# Patient Record
Sex: Female | Born: 1960 | Race: Black or African American | Hispanic: No | Smoking: Former smoker
Health system: Southern US, Community
[De-identification: ages and names within clinical notes are randomized; demographics above are authoritative.]

## PROBLEM LIST (undated history)

## (undated) DIAGNOSIS — I82409 Acute embolism and thrombosis of unspecified deep veins of unspecified lower extremity: Secondary | ICD-10-CM

## (undated) DIAGNOSIS — G709 Myoneural disorder, unspecified: Secondary | ICD-10-CM

## (undated) DIAGNOSIS — M4802 Spinal stenosis, cervical region: Secondary | ICD-10-CM

## (undated) DIAGNOSIS — T148XXA Other injury of unspecified body region, initial encounter: Principal | ICD-10-CM

## (undated) DIAGNOSIS — K219 Gastro-esophageal reflux disease without esophagitis: Secondary | ICD-10-CM

## (undated) DIAGNOSIS — M25569 Pain in unspecified knee: Secondary | ICD-10-CM

## (undated) DIAGNOSIS — B1920 Unspecified viral hepatitis C without hepatic coma: Secondary | ICD-10-CM

## (undated) DIAGNOSIS — Z86718 Personal history of other venous thrombosis and embolism: Secondary | ICD-10-CM

## (undated) DIAGNOSIS — I10 Essential (primary) hypertension: Secondary | ICD-10-CM

## (undated) DIAGNOSIS — G4733 Obstructive sleep apnea (adult) (pediatric): Secondary | ICD-10-CM

## (undated) DIAGNOSIS — N6452 Nipple discharge: Secondary | ICD-10-CM

## (undated) HISTORY — DX: Unspecified viral hepatitis C without hepatic coma: B19.20

## (undated) HISTORY — PX: DILATION AND CURETTAGE OF UTERUS: SHX78

## (undated) HISTORY — PX: OTHER SURGICAL HISTORY: SHX169

## (undated) HISTORY — DX: Acute embolism and thrombosis of unspecified deep veins of unspecified lower extremity: I82.409

## (undated) HISTORY — PX: FRACTURE SURGERY: SHX138

## (undated) HISTORY — DX: Other injury of unspecified body region, initial encounter: T14.8XXA

## (undated) HISTORY — PX: CHOLECYSTECTOMY: SHX55

## (undated) HISTORY — DX: Nipple discharge: N64.52

## (undated) HISTORY — PX: TONSILLECTOMY: SUR1361

## (undated) HISTORY — PX: ABDOMINAL HYSTERECTOMY: SHX81

## (undated) HISTORY — DX: Gastro-esophageal reflux disease without esophagitis: K21.9

## (undated) HISTORY — DX: Personal history of other venous thrombosis and embolism: Z86.718

## (undated) HISTORY — DX: Obstructive sleep apnea (adult) (pediatric): G47.33

## (undated) HISTORY — DX: Pain in unspecified knee: M25.569

## (undated) HISTORY — PX: PILONIDAL CYST EXCISION: SHX744

---

## 2003-08-31 ENCOUNTER — Other Ambulatory Visit: Admission: RE | Admit: 2003-08-31 | Discharge: 2003-08-31 | Payer: Self-pay | Admitting: Family Medicine

## 2003-10-10 ENCOUNTER — Encounter: Admission: RE | Admit: 2003-10-10 | Discharge: 2003-10-10 | Payer: Self-pay | Admitting: Family Medicine

## 2003-11-16 ENCOUNTER — Encounter: Admission: RE | Admit: 2003-11-16 | Discharge: 2003-11-16 | Payer: Self-pay | Admitting: Family Medicine

## 2004-01-15 ENCOUNTER — Ambulatory Visit: Payer: Self-pay | Admitting: Family Medicine

## 2004-02-05 ENCOUNTER — Ambulatory Visit: Payer: Self-pay | Admitting: Gastroenterology

## 2004-02-14 ENCOUNTER — Ambulatory Visit: Payer: Self-pay | Admitting: Family Medicine

## 2004-02-20 ENCOUNTER — Ambulatory Visit (HOSPITAL_COMMUNITY): Admission: RE | Admit: 2004-02-20 | Discharge: 2004-02-20 | Payer: Self-pay | Admitting: Gastroenterology

## 2004-02-20 ENCOUNTER — Encounter (INDEPENDENT_AMBULATORY_CARE_PROVIDER_SITE_OTHER): Payer: Self-pay | Admitting: *Deleted

## 2004-03-17 ENCOUNTER — Ambulatory Visit: Payer: Self-pay | Admitting: Gastroenterology

## 2004-04-07 ENCOUNTER — Ambulatory Visit: Payer: Self-pay | Admitting: Gastroenterology

## 2004-04-28 ENCOUNTER — Ambulatory Visit: Payer: Self-pay | Admitting: Gastroenterology

## 2004-04-28 ENCOUNTER — Ambulatory Visit: Payer: Self-pay | Admitting: Family Medicine

## 2004-05-15 ENCOUNTER — Ambulatory Visit: Payer: Self-pay | Admitting: Internal Medicine

## 2004-05-28 ENCOUNTER — Ambulatory Visit: Payer: Self-pay | Admitting: Family Medicine

## 2004-05-28 ENCOUNTER — Ambulatory Visit: Payer: Self-pay

## 2004-05-29 ENCOUNTER — Ambulatory Visit: Payer: Self-pay | Admitting: Internal Medicine

## 2004-05-30 ENCOUNTER — Ambulatory Visit: Payer: Self-pay | Admitting: Internal Medicine

## 2004-06-02 ENCOUNTER — Ambulatory Visit: Payer: Self-pay | Admitting: Family Medicine

## 2004-06-04 ENCOUNTER — Encounter: Admission: RE | Admit: 2004-06-04 | Discharge: 2004-06-04 | Payer: Self-pay | Admitting: Family Medicine

## 2004-06-05 ENCOUNTER — Ambulatory Visit: Payer: Self-pay | Admitting: Oncology

## 2004-06-06 ENCOUNTER — Ambulatory Visit: Payer: Self-pay | Admitting: Family Medicine

## 2004-06-09 ENCOUNTER — Ambulatory Visit: Payer: Self-pay | Admitting: Family Medicine

## 2004-06-16 ENCOUNTER — Ambulatory Visit: Payer: Self-pay | Admitting: Family Medicine

## 2004-06-19 ENCOUNTER — Ambulatory Visit: Payer: Self-pay | Admitting: Internal Medicine

## 2004-06-20 ENCOUNTER — Ambulatory Visit: Payer: Self-pay | Admitting: Family Medicine

## 2004-06-26 ENCOUNTER — Other Ambulatory Visit: Admission: RE | Admit: 2004-06-26 | Discharge: 2004-06-26 | Payer: Self-pay | Admitting: Obstetrics and Gynecology

## 2004-07-03 ENCOUNTER — Ambulatory Visit: Payer: Self-pay | Admitting: Family Medicine

## 2004-07-17 ENCOUNTER — Ambulatory Visit: Payer: Self-pay | Admitting: Internal Medicine

## 2004-08-11 ENCOUNTER — Ambulatory Visit: Payer: Self-pay | Admitting: Family Medicine

## 2004-08-18 ENCOUNTER — Ambulatory Visit: Payer: Self-pay | Admitting: Oncology

## 2004-08-21 ENCOUNTER — Ambulatory Visit: Payer: Self-pay | Admitting: Gastroenterology

## 2004-08-25 ENCOUNTER — Ambulatory Visit (HOSPITAL_COMMUNITY): Admission: RE | Admit: 2004-08-25 | Discharge: 2004-08-25 | Payer: Self-pay | Admitting: Obstetrics and Gynecology

## 2004-09-12 ENCOUNTER — Ambulatory Visit: Payer: Self-pay | Admitting: Family Medicine

## 2004-09-12 ENCOUNTER — Ambulatory Visit: Payer: Self-pay

## 2004-09-18 ENCOUNTER — Ambulatory Visit: Payer: Self-pay | Admitting: Gastroenterology

## 2004-10-16 ENCOUNTER — Ambulatory Visit: Payer: Self-pay | Admitting: Internal Medicine

## 2004-11-13 ENCOUNTER — Ambulatory Visit: Payer: Self-pay | Admitting: Gastroenterology

## 2005-01-06 ENCOUNTER — Ambulatory Visit: Payer: Self-pay | Admitting: Oncology

## 2005-01-08 ENCOUNTER — Ambulatory Visit: Payer: Self-pay | Admitting: Gastroenterology

## 2005-02-18 ENCOUNTER — Inpatient Hospital Stay (HOSPITAL_COMMUNITY): Admission: RE | Admit: 2005-02-18 | Discharge: 2005-02-19 | Payer: Self-pay | Admitting: Obstetrics and Gynecology

## 2005-02-18 ENCOUNTER — Encounter (INDEPENDENT_AMBULATORY_CARE_PROVIDER_SITE_OTHER): Payer: Self-pay | Admitting: *Deleted

## 2005-02-23 ENCOUNTER — Ambulatory Visit: Payer: Self-pay | Admitting: Oncology

## 2005-04-08 ENCOUNTER — Ambulatory Visit: Payer: Self-pay | Admitting: Family Medicine

## 2005-04-13 ENCOUNTER — Ambulatory Visit: Payer: Self-pay | Admitting: Family Medicine

## 2005-04-23 ENCOUNTER — Encounter: Admission: RE | Admit: 2005-04-23 | Discharge: 2005-04-23 | Payer: Self-pay | Admitting: Family Medicine

## 2005-05-06 ENCOUNTER — Ambulatory Visit (HOSPITAL_BASED_OUTPATIENT_CLINIC_OR_DEPARTMENT_OTHER): Admission: RE | Admit: 2005-05-06 | Discharge: 2005-05-06 | Payer: Self-pay | Admitting: Family Medicine

## 2005-05-28 ENCOUNTER — Ambulatory Visit: Payer: Self-pay | Admitting: Pulmonary Disease

## 2005-06-25 ENCOUNTER — Ambulatory Visit: Payer: Self-pay | Admitting: Pulmonary Disease

## 2005-10-07 ENCOUNTER — Ambulatory Visit: Payer: Self-pay | Admitting: Family Medicine

## 2005-10-22 ENCOUNTER — Ambulatory Visit: Payer: Self-pay | Admitting: Gastroenterology

## 2006-03-25 ENCOUNTER — Ambulatory Visit: Payer: Self-pay | Admitting: Family Medicine

## 2006-03-25 LAB — CONVERTED CEMR LAB
BUN: 10 mg/dL (ref 6–23)
Bilirubin, Direct: 0.2 mg/dL (ref 0.0–0.3)
CO2: 28 meq/L (ref 19–32)
Chloride: 106 meq/L (ref 96–112)
Glucose, Bld: 126 mg/dL — ABNORMAL HIGH (ref 70–99)
Sodium: 139 meq/L (ref 135–145)
Total Protein: 7.2 g/dL (ref 6.0–8.3)

## 2006-05-04 ENCOUNTER — Ambulatory Visit: Payer: Self-pay | Admitting: Gastroenterology

## 2006-07-27 ENCOUNTER — Telehealth (INDEPENDENT_AMBULATORY_CARE_PROVIDER_SITE_OTHER): Payer: Self-pay | Admitting: *Deleted

## 2006-07-28 ENCOUNTER — Ambulatory Visit: Payer: Self-pay | Admitting: Family Medicine

## 2006-07-28 LAB — CONVERTED CEMR LAB
Bilirubin Urine: NEGATIVE
Glucose, Urine, Semiquant: NEGATIVE
Protein, U semiquant: NEGATIVE
Urobilinogen, UA: NEGATIVE

## 2006-09-15 ENCOUNTER — Ambulatory Visit: Payer: Self-pay | Admitting: Family Medicine

## 2006-09-15 DIAGNOSIS — M25569 Pain in unspecified knee: Secondary | ICD-10-CM | POA: Insufficient documentation

## 2006-09-15 LAB — CONVERTED CEMR LAB
Blood in Urine, dipstick: NEGATIVE
Nitrite: NEGATIVE
WBC Urine, dipstick: NEGATIVE
pH: 6.5

## 2006-09-16 ENCOUNTER — Encounter (INDEPENDENT_AMBULATORY_CARE_PROVIDER_SITE_OTHER): Payer: Self-pay | Admitting: *Deleted

## 2006-09-20 ENCOUNTER — Encounter (INDEPENDENT_AMBULATORY_CARE_PROVIDER_SITE_OTHER): Payer: Self-pay | Admitting: *Deleted

## 2006-10-08 ENCOUNTER — Ambulatory Visit: Payer: Self-pay | Admitting: Family Medicine

## 2006-10-08 ENCOUNTER — Other Ambulatory Visit: Admission: RE | Admit: 2006-10-08 | Discharge: 2006-10-08 | Payer: Self-pay | Admitting: Family Medicine

## 2006-10-08 ENCOUNTER — Encounter: Payer: Self-pay | Admitting: Family Medicine

## 2006-10-08 DIAGNOSIS — N6019 Diffuse cystic mastopathy of unspecified breast: Secondary | ICD-10-CM | POA: Insufficient documentation

## 2006-10-08 DIAGNOSIS — G47419 Narcolepsy without cataplexy: Secondary | ICD-10-CM | POA: Insufficient documentation

## 2006-10-08 DIAGNOSIS — J4530 Mild persistent asthma, uncomplicated: Secondary | ICD-10-CM | POA: Insufficient documentation

## 2006-10-08 DIAGNOSIS — G4733 Obstructive sleep apnea (adult) (pediatric): Secondary | ICD-10-CM | POA: Insufficient documentation

## 2006-10-08 DIAGNOSIS — B1710 Acute hepatitis C without hepatic coma: Secondary | ICD-10-CM | POA: Insufficient documentation

## 2006-10-08 DIAGNOSIS — Z86718 Personal history of other venous thrombosis and embolism: Secondary | ICD-10-CM | POA: Insufficient documentation

## 2006-10-08 DIAGNOSIS — K219 Gastro-esophageal reflux disease without esophagitis: Secondary | ICD-10-CM | POA: Insufficient documentation

## 2006-10-08 DIAGNOSIS — N318 Other neuromuscular dysfunction of bladder: Secondary | ICD-10-CM | POA: Insufficient documentation

## 2006-10-14 ENCOUNTER — Encounter (INDEPENDENT_AMBULATORY_CARE_PROVIDER_SITE_OTHER): Payer: Self-pay | Admitting: *Deleted

## 2006-10-26 ENCOUNTER — Encounter: Payer: Self-pay | Admitting: Family Medicine

## 2006-11-30 ENCOUNTER — Ambulatory Visit: Payer: Self-pay | Admitting: Gastroenterology

## 2007-01-07 ENCOUNTER — Ambulatory Visit: Payer: Self-pay | Admitting: *Deleted

## 2007-01-07 ENCOUNTER — Telehealth: Payer: Self-pay | Admitting: Family Medicine

## 2007-01-07 ENCOUNTER — Emergency Department (HOSPITAL_COMMUNITY): Admission: EM | Admit: 2007-01-07 | Discharge: 2007-01-07 | Payer: Self-pay | Admitting: Emergency Medicine

## 2007-01-07 ENCOUNTER — Encounter (INDEPENDENT_AMBULATORY_CARE_PROVIDER_SITE_OTHER): Payer: Self-pay | Admitting: Emergency Medicine

## 2007-02-17 ENCOUNTER — Ambulatory Visit: Payer: Self-pay | Admitting: Internal Medicine

## 2007-02-17 LAB — CONVERTED CEMR LAB
Bilirubin Urine: NEGATIVE
Nitrite: NEGATIVE
Specific Gravity, Urine: 1.01
pH: 5

## 2007-02-21 ENCOUNTER — Telehealth (INDEPENDENT_AMBULATORY_CARE_PROVIDER_SITE_OTHER): Payer: Self-pay | Admitting: *Deleted

## 2007-02-21 LAB — CONVERTED CEMR LAB: Chlamydia, Swab/Urine, PCR: NEGATIVE

## 2007-03-21 ENCOUNTER — Encounter: Payer: Self-pay | Admitting: Family Medicine

## 2007-07-06 ENCOUNTER — Ambulatory Visit: Payer: Self-pay | Admitting: Internal Medicine

## 2007-08-10 ENCOUNTER — Telehealth (INDEPENDENT_AMBULATORY_CARE_PROVIDER_SITE_OTHER): Payer: Self-pay | Admitting: *Deleted

## 2007-08-11 ENCOUNTER — Encounter: Payer: Self-pay | Admitting: Family Medicine

## 2007-08-29 ENCOUNTER — Ambulatory Visit: Payer: Self-pay | Admitting: Family Medicine

## 2007-08-29 LAB — CONVERTED CEMR LAB
Albumin: 3.3 g/dL — ABNORMAL LOW (ref 3.5–5.2)
BUN: 6 mg/dL (ref 6–23)
Basophils Absolute: 0 10*3/uL (ref 0.0–0.1)
Basophils Relative: 0.2 % (ref 0.0–3.0)
Bilirubin Urine: NEGATIVE
CO2: 29 meq/L (ref 19–32)
Calcium: 8.8 mg/dL (ref 8.4–10.5)
Creatinine, Ser: 0.8 mg/dL (ref 0.4–1.2)
GFR calc Af Amer: 98 mL/min
GFR calc non Af Amer: 81 mL/min
Glucose, Bld: 83 mg/dL (ref 70–99)
Glucose, Urine, Semiquant: NEGATIVE
Inflenza A Ag: NEGATIVE
Monocytes Absolute: 0.7 10*3/uL (ref 0.1–1.0)
Neutrophils Relative %: 76.9 % (ref 43.0–77.0)
RDW: 13 % (ref 11.5–14.6)
Specific Gravity, Urine: <1.005
Total Protein: 7.9 g/dL (ref 6.0–8.3)
Urobilinogen, UA: 0.2
pH: 5

## 2007-08-30 ENCOUNTER — Telehealth (INDEPENDENT_AMBULATORY_CARE_PROVIDER_SITE_OTHER): Payer: Self-pay | Admitting: *Deleted

## 2007-09-01 ENCOUNTER — Telehealth (INDEPENDENT_AMBULATORY_CARE_PROVIDER_SITE_OTHER): Payer: Self-pay | Admitting: *Deleted

## 2007-12-08 ENCOUNTER — Ambulatory Visit: Payer: Self-pay | Admitting: Gastroenterology

## 2007-12-08 ENCOUNTER — Encounter: Payer: Self-pay | Admitting: Family Medicine

## 2007-12-14 ENCOUNTER — Encounter: Admission: RE | Admit: 2007-12-14 | Discharge: 2007-12-14 | Payer: Self-pay | Admitting: Obstetrics and Gynecology

## 2007-12-19 ENCOUNTER — Ambulatory Visit (HOSPITAL_COMMUNITY): Admission: RE | Admit: 2007-12-19 | Discharge: 2007-12-19 | Payer: Self-pay | Admitting: Gastroenterology

## 2008-02-08 ENCOUNTER — Ambulatory Visit: Payer: Self-pay | Admitting: Family Medicine

## 2008-02-08 DIAGNOSIS — M79609 Pain in unspecified limb: Secondary | ICD-10-CM | POA: Insufficient documentation

## 2008-02-09 ENCOUNTER — Encounter (INDEPENDENT_AMBULATORY_CARE_PROVIDER_SITE_OTHER): Payer: Self-pay | Admitting: *Deleted

## 2008-02-09 ENCOUNTER — Telehealth (INDEPENDENT_AMBULATORY_CARE_PROVIDER_SITE_OTHER): Payer: Self-pay | Admitting: *Deleted

## 2008-02-24 ENCOUNTER — Telehealth: Payer: Self-pay | Admitting: Family Medicine

## 2008-02-27 ENCOUNTER — Ambulatory Visit: Payer: Self-pay | Admitting: Family Medicine

## 2008-02-27 ENCOUNTER — Ambulatory Visit: Admission: RE | Admit: 2008-02-27 | Discharge: 2008-02-27 | Payer: Self-pay | Admitting: Family Medicine

## 2008-02-27 ENCOUNTER — Encounter: Payer: Self-pay | Admitting: Family Medicine

## 2008-02-27 DIAGNOSIS — R0602 Shortness of breath: Secondary | ICD-10-CM | POA: Insufficient documentation

## 2008-02-28 ENCOUNTER — Encounter: Payer: Self-pay | Admitting: Family Medicine

## 2008-02-28 ENCOUNTER — Telehealth (INDEPENDENT_AMBULATORY_CARE_PROVIDER_SITE_OTHER): Payer: Self-pay | Admitting: *Deleted

## 2008-02-28 ENCOUNTER — Encounter (INDEPENDENT_AMBULATORY_CARE_PROVIDER_SITE_OTHER): Payer: Self-pay | Admitting: *Deleted

## 2008-04-02 ENCOUNTER — Encounter: Payer: Self-pay | Admitting: Family Medicine

## 2008-04-12 ENCOUNTER — Encounter: Payer: Self-pay | Admitting: Family Medicine

## 2008-04-27 ENCOUNTER — Encounter: Payer: Self-pay | Admitting: Family Medicine

## 2008-07-09 ENCOUNTER — Ambulatory Visit: Payer: Self-pay | Admitting: Family Medicine

## 2008-07-09 DIAGNOSIS — A599 Trichomoniasis, unspecified: Secondary | ICD-10-CM | POA: Insufficient documentation

## 2008-07-09 DIAGNOSIS — N760 Acute vaginitis: Secondary | ICD-10-CM | POA: Insufficient documentation

## 2008-07-10 LAB — CONVERTED CEMR LAB: GC Probe Amp, Genital: NEGATIVE

## 2008-09-12 ENCOUNTER — Encounter: Payer: Self-pay | Admitting: Family Medicine

## 2008-09-20 ENCOUNTER — Encounter: Payer: Self-pay | Admitting: Family Medicine

## 2008-09-20 ENCOUNTER — Ambulatory Visit: Payer: Self-pay | Admitting: Gastroenterology

## 2008-10-23 ENCOUNTER — Emergency Department (HOSPITAL_COMMUNITY): Admission: EM | Admit: 2008-10-23 | Discharge: 2008-10-23 | Payer: Self-pay | Admitting: Emergency Medicine

## 2008-10-24 ENCOUNTER — Ambulatory Visit: Payer: Self-pay | Admitting: Family Medicine

## 2008-10-24 ENCOUNTER — Encounter: Admission: RE | Admit: 2008-10-24 | Discharge: 2008-10-24 | Payer: Self-pay | Admitting: Orthopedic Surgery

## 2008-10-24 DIAGNOSIS — S83006A Unspecified dislocation of unspecified patella, initial encounter: Secondary | ICD-10-CM | POA: Insufficient documentation

## 2008-10-24 DIAGNOSIS — N390 Urinary tract infection, site not specified: Secondary | ICD-10-CM | POA: Insufficient documentation

## 2008-10-30 ENCOUNTER — Inpatient Hospital Stay (HOSPITAL_COMMUNITY): Admission: RE | Admit: 2008-10-30 | Discharge: 2008-11-02 | Payer: Self-pay | Admitting: Orthopedic Surgery

## 2009-02-04 ENCOUNTER — Telehealth (INDEPENDENT_AMBULATORY_CARE_PROVIDER_SITE_OTHER): Payer: Self-pay | Admitting: *Deleted

## 2009-02-05 ENCOUNTER — Ambulatory Visit: Payer: Self-pay | Admitting: Family Medicine

## 2009-02-05 DIAGNOSIS — R300 Dysuria: Secondary | ICD-10-CM | POA: Insufficient documentation

## 2009-02-05 LAB — CONVERTED CEMR LAB
Bilirubin Urine: NEGATIVE
Glucose, Urine, Semiquant: NEGATIVE
Nitrite: NEGATIVE
Specific Gravity, Urine: 1.015
pH: 6.5

## 2009-02-06 ENCOUNTER — Encounter: Payer: Self-pay | Admitting: Family Medicine

## 2009-02-07 ENCOUNTER — Encounter: Payer: Self-pay | Admitting: Family Medicine

## 2009-02-11 ENCOUNTER — Telehealth (INDEPENDENT_AMBULATORY_CARE_PROVIDER_SITE_OTHER): Payer: Self-pay | Admitting: *Deleted

## 2009-02-11 LAB — CONVERTED CEMR LAB
Casts: NONE SEEN /lpf
GC Probe Amp, Urine: NEGATIVE
Squamous Epithelial / LPF: NONE SEEN /lpf

## 2009-03-19 ENCOUNTER — Telehealth: Payer: Self-pay | Admitting: Family Medicine

## 2009-03-20 ENCOUNTER — Ambulatory Visit: Payer: Self-pay | Admitting: Family Medicine

## 2009-04-22 ENCOUNTER — Ambulatory Visit: Payer: Self-pay | Admitting: Family Medicine

## 2009-04-22 DIAGNOSIS — L919 Hypertrophic disorder of the skin, unspecified: Secondary | ICD-10-CM

## 2009-04-22 DIAGNOSIS — N39498 Other specified urinary incontinence: Secondary | ICD-10-CM | POA: Insufficient documentation

## 2009-04-22 DIAGNOSIS — D239 Other benign neoplasm of skin, unspecified: Secondary | ICD-10-CM | POA: Insufficient documentation

## 2009-04-22 DIAGNOSIS — L909 Atrophic disorder of skin, unspecified: Secondary | ICD-10-CM | POA: Insufficient documentation

## 2009-04-25 ENCOUNTER — Encounter: Payer: Self-pay | Admitting: Family Medicine

## 2009-04-29 ENCOUNTER — Telehealth (INDEPENDENT_AMBULATORY_CARE_PROVIDER_SITE_OTHER): Payer: Self-pay | Admitting: *Deleted

## 2009-05-01 ENCOUNTER — Encounter: Payer: Self-pay | Admitting: Family Medicine

## 2009-07-08 ENCOUNTER — Encounter: Payer: Self-pay | Admitting: Family Medicine

## 2009-07-24 ENCOUNTER — Encounter: Payer: Self-pay | Admitting: Family Medicine

## 2009-08-02 ENCOUNTER — Ambulatory Visit: Payer: Self-pay | Admitting: Family Medicine

## 2009-08-02 DIAGNOSIS — H6690 Otitis media, unspecified, unspecified ear: Secondary | ICD-10-CM | POA: Insufficient documentation

## 2009-08-20 ENCOUNTER — Ambulatory Visit: Admission: RE | Admit: 2009-08-20 | Discharge: 2009-08-20 | Payer: Self-pay | Admitting: Family Medicine

## 2009-08-20 ENCOUNTER — Telehealth: Payer: Self-pay | Admitting: Family Medicine

## 2009-08-20 ENCOUNTER — Encounter: Payer: Self-pay | Admitting: Family Medicine

## 2009-08-20 ENCOUNTER — Ambulatory Visit: Payer: Self-pay | Admitting: Family Medicine

## 2009-08-20 ENCOUNTER — Ambulatory Visit: Payer: Self-pay | Admitting: Vascular Surgery

## 2009-08-20 LAB — CONVERTED CEMR LAB: aPTT: 29.9 s — ABNORMAL HIGH (ref 21.7–28.8)

## 2009-09-02 ENCOUNTER — Encounter: Payer: Self-pay | Admitting: Family Medicine

## 2009-09-03 ENCOUNTER — Encounter: Admission: RE | Admit: 2009-09-03 | Discharge: 2009-10-31 | Payer: Self-pay | Admitting: Obstetrics and Gynecology

## 2009-09-27 ENCOUNTER — Ambulatory Visit: Payer: Self-pay | Admitting: Family Medicine

## 2009-09-27 DIAGNOSIS — H60339 Swimmer's ear, unspecified ear: Secondary | ICD-10-CM | POA: Insufficient documentation

## 2010-01-13 ENCOUNTER — Inpatient Hospital Stay (HOSPITAL_COMMUNITY)
Admission: AD | Admit: 2010-01-13 | Discharge: 2010-01-14 | Payer: Self-pay | Source: Home / Self Care | Attending: Obstetrics and Gynecology | Admitting: Obstetrics and Gynecology

## 2010-02-06 ENCOUNTER — Ambulatory Visit
Admission: RE | Admit: 2010-02-06 | Discharge: 2010-02-06 | Payer: Self-pay | Source: Home / Self Care | Attending: Family Medicine | Admitting: Family Medicine

## 2010-02-06 ENCOUNTER — Encounter: Payer: Self-pay | Admitting: Family Medicine

## 2010-02-06 ENCOUNTER — Other Ambulatory Visit: Payer: Self-pay | Admitting: Family Medicine

## 2010-02-06 DIAGNOSIS — I10 Essential (primary) hypertension: Secondary | ICD-10-CM | POA: Insufficient documentation

## 2010-02-07 ENCOUNTER — Telehealth (INDEPENDENT_AMBULATORY_CARE_PROVIDER_SITE_OTHER): Payer: Self-pay | Admitting: *Deleted

## 2010-02-07 ENCOUNTER — Encounter: Payer: Self-pay | Admitting: Family Medicine

## 2010-02-07 LAB — BASIC METABOLIC PANEL
BUN: 11 mg/dL (ref 6–23)
CO2: 29 mEq/L (ref 19–32)
Calcium: 9.2 mg/dL (ref 8.4–10.5)
Chloride: 104 mEq/L (ref 96–112)
Creatinine, Ser: 0.7 mg/dL (ref 0.4–1.2)
GFR: 115.02 mL/min (ref >60.00)
Glucose, Bld: 73 mg/dL (ref 70–99)
Potassium: 3.9 mEq/L (ref 3.5–5.1)
Sodium: 141 mEq/L (ref 135–145)

## 2010-02-11 ENCOUNTER — Ambulatory Visit (HOSPITAL_COMMUNITY)
Admission: RE | Admit: 2010-02-11 | Discharge: 2010-02-11 | Payer: Self-pay | Source: Home / Self Care | Attending: Gastroenterology | Admitting: Gastroenterology

## 2010-02-17 LAB — CBC
HCT: 44.9 % (ref 36.0–46.0)
Hemoglobin: 16 g/dL — ABNORMAL HIGH (ref 12.0–15.0)
MCH: 31.6 pg (ref 26.0–34.0)
MCHC: 35.6 g/dL (ref 30.0–36.0)
MCV: 88.7 fL (ref 78.0–100.0)
Platelets: 155 10*3/uL (ref 150–400)
RBC: 5.06 MIL/uL (ref 3.87–5.11)
RDW: 13.4 % (ref 11.5–15.5)
WBC: 4 10*3/uL (ref 4.0–10.5)

## 2010-02-17 LAB — APTT: aPTT: 30 seconds (ref 24–37)

## 2010-02-17 LAB — PROTIME-INR
INR: 1.04 (ref 0.00–1.49)
Prothrombin Time: 13.8 seconds (ref 11.6–15.2)

## 2010-02-20 ENCOUNTER — Ambulatory Visit: Admit: 2010-02-20 | Payer: Self-pay | Admitting: Family Medicine

## 2010-02-23 ENCOUNTER — Encounter: Payer: Self-pay | Admitting: Obstetrics and Gynecology

## 2010-02-23 ENCOUNTER — Encounter: Payer: Self-pay | Admitting: Family Medicine

## 2010-02-23 ENCOUNTER — Encounter: Payer: Self-pay | Admitting: Gastroenterology

## 2010-03-02 LAB — CONVERTED CEMR LAB
ALT: 74 units/L — ABNORMAL HIGH (ref 0–35)
AST: 56 units/L — ABNORMAL HIGH (ref 0–37)
Albumin: 3.6 g/dL (ref 3.5–5.2)
Albumin: 3.7 g/dL (ref 3.5–5.2)
Basophils Absolute: 0 10*3/uL (ref 0.0–0.1)
Basophils Relative: 0 % (ref 0.0–1.0)
Basophils Relative: 0.5 % (ref 0.0–3.0)
Bilirubin, Direct: 0.1 mg/dL (ref 0.0–0.3)
CO2: 32 meq/L (ref 19–32)
Chloride: 102 meq/L (ref 96–112)
Creatinine, Ser: 0.6 mg/dL (ref 0.4–1.2)
Creatinine, Ser: 0.7 mg/dL (ref 0.4–1.2)
Eosinophils Absolute: 0.1 10*3/uL (ref 0.0–0.7)
Eosinophils Absolute: 0.2 10*3/uL (ref 0.0–0.6)
Eosinophils Relative: 5.5 % — ABNORMAL HIGH (ref 0.0–5.0)
GFR calc non Af Amer: 113 mL/min
HDL: 49.9 mg/dL (ref >39.0)
Lymphocytes Relative: 35.9 % (ref 12.0–46.0)
Lymphocytes Relative: 40.6 % (ref 12.0–46.0)
MCHC: 34.6 g/dL (ref 30.0–36.0)
MCV: 92.7 fL (ref 78.0–100.0)
Monocytes Absolute: 0.3 10*3/uL (ref 0.1–1.0)
Monocytes Absolute: 0.3 10*3/uL (ref 0.2–0.7)
Neutrophils Relative %: 46.1 % (ref 43.0–77.0)
Potassium: 3.4 meq/L — ABNORMAL LOW (ref 3.5–5.1)
Potassium: 3.8 meq/L (ref 3.5–5.1)
RBC: 4.88 M/uL (ref 3.87–5.11)
RBC: 4.92 M/uL (ref 3.87–5.11)
RDW: 12.6 % (ref 11.5–14.6)
Sodium: 143 meq/L (ref 135–145)
TSH: 0.93 microintl units/mL (ref 0.35–5.50)
TSH: 1.35 microintl units/mL (ref 0.35–5.50)
Total CHOL/HDL Ratio: 3
Total Protein: 7 g/dL (ref 6.0–8.3)
Total Protein: 7.2 g/dL (ref 6.0–8.3)
Triglycerides: 82 mg/dL (ref 0–149)
WBC: 4 10*3/uL — ABNORMAL LOW (ref 4.5–10.5)

## 2010-03-05 NOTE — Assessment & Plan Note (Signed)
Summary: blood clots in arms and leg//fd   Vital Signs:  Patient profile:   50 year old female Height:      63.75 inches Weight:      217 pounds Temp:     98.7 degrees F oral Pulse rate:   80 / minute BP sitting:   118 / 72  (left arm)  Vitals Entered By: Jeremy Johann CMA (August 20, 2009 3:29 PM) CC: blood clot in arm and legs   History of Present Illness: Pt here c/o L calf pain and L arm--pt noticed bruise L leg and arm last night.  Pt with hx clot in arm and leg in past and was on coumadin until 3 years ago.  no cp, no sob.  Current Medications (verified): 1)  Gabapentin 300 Mg Caps (Gabapentin) .... Take Once Daily 2)  Proventil Hfa 108 (90 Base) Mcg/act  Aers (Albuterol Sulfate) .... 2 Puffs 4 X A Day As Needed 3)  Provigil 200 Mg  Tabs (Modafinil) .Marland Kitchen.. 1 By Mouth Once Daily 4)  Prilosec 20 Mg  Cpdr (Omeprazole) .Marland Kitchen.. 1 By Mouth Once Daily 5)  Cpap 6)  Klor-Con M20 20 Meq Cr-Tabs (Potassium Chloride Crys Cr) .... Take 1 Tab Once Daily 7)  Ceftin 500 Mg Tabs (Cefuroxime Axetil) .... Take 1 By Mouth Two Times A Day For 10 Days  Allergies (verified): 1)  ! Codeine 2)  ! Sulfa  Past History:  Past medical, surgical, family and social histories (including risk factors) reviewed for relevance to current acute and chronic problems.  Past Medical History: Reviewed history from 10/08/2006 and no changes required. Asthma GERD Hepatitis C DVT, hx of  Past Surgical History: Reviewed history from 02/17/2007 and no changes required. Cholecystectomy Hysterectomy, ?oophorectomy  Family History: Reviewed history from 10/08/2006 and no changes required. Family History of Alcoholism/Addiction Family History Breast cancer 1st degree relative <50 Family History of CAD Female 1st degree relative <60 Family History Diabetes 1st degree relative  Social History: Reviewed history from 10/08/2006 and no changes required. Divorced Never Smoked Alcohol use-no Drug use-no Regular  exercise-yes  Review of Systems      See HPI  Physical Exam  General:  Well-developed,well-nourished,in no acute distress; alert,appropriate and cooperative throughout examination Lungs:  Normal respiratory effort, chest expands symmetrically. Lungs are clear to auscultation, no crackles or wheezes. Heart:  Normal rate and regular rhythm. S1 and S2 normal without gallop, murmur, click, rub or other extra sounds. Extremities:  L leg--+ calf pain and swelling  bruise on L leg Psych:  Cognition and judgment appear intact. Alert and cooperative with normal attention span and concentration. No apparent delusions, illusions, hallucinations   Impression & Recommendations:  Problem # 1:  CALF PAIN, LEFT (ICD-729.5)  Orders: Doppler Referral (Doppler) Venipuncture ([address]) TLB-PT (Protime) (85610-PTP) TLB-PTT (85730-PTTL)  Complete Medication List: 1)  Gabapentin 300 Mg Caps (Gabapentin) .... Take once daily 2)  Proventil Hfa 108 (90 Base) Mcg/act Aers (Albuterol sulfate) .... 2 puffs 4 x a day as needed 3)  Provigil 200 Mg Tabs (Modafinil) .Marland Kitchen.. 1 by mouth once daily 4)  Prilosec 20 Mg Cpdr (Omeprazole) .Marland Kitchen.. 1 by mouth once daily 5)  Cpap  6)  Klor-con M20 20 Meq Cr-tabs (Potassium chloride crys cr) .... Take 1 tab once daily 7)  Ceftin 500 Mg Tabs (Cefuroxime axetil) .... Take 1 by mouth two times a day for 10 days

## 2010-03-05 NOTE — Assessment & Plan Note (Signed)
Summary: congested//kn   Vital Signs:  Patient profile:   50 year old female Weight:      215 pounds O2 Sat:      95 % on Room air Temp:     97.8 degrees F oral Pulse rate:   85 / minute BP sitting:   124 / 80  (left arm)  Vitals Entered By: Doristine Devoid (August 02, 2009 1:30 PM)  O2 Flow:  Room air CC: cough and congestion x3 days    History of Present Illness: 50 yo woman here today for cough and congestion.  sxs started 3 days ago.  + sore throat, ear pain and drainage.  some facial pain and pressure.  no fevers.  cough is intermittantly productive.  no known sick contacts.    needs refill on Neurontin.  Allergies (verified): 1)  ! Codeine 2)  ! Sulfa  Past History:  Past Medical History: Last updated: 10/08/2006 Asthma GERD Hepatitis C DVT, hx of  Review of Systems      See HPI  Physical Exam  General:  Well-developed,well-nourished,in no acute distress; alert,appropriate and cooperative throughout examination Head:  NCAT, mild TTP over sinuses Eyes:  no injxn or inflammation Ears:  TMs erythematous, dull, poor landmarks Nose:  + congestion Mouth:  +PND Neck:  No deformities, masses, or tenderness noted. Lungs:  decreased breath sounds bilaterally w/ end expiratory wheezes Heart:  normal rate and no murmur.     Impression & Recommendations:  Problem # 1:  OTITIS MEDIA (ICD-382.9) Assessment New  start Amox.  reviewed supportive care and red flags that should prompt return.  Pt expresses understanding and is in agreement w/ this plan. Her updated medication list for this problem includes:    Amoxicillin 500 Mg Tabs (Amoxicillin) .Marland Kitchen... 2 pills two times a day x10 days  Orders: Prescription Created Electronically [phone])  Problem # 2:  ASTHMA (ICD-493.90) Assessment: Unchanged pt to use inhaler regularly while not feeling well. Her updated medication list for this problem includes:    Proventil Hfa 108 (90 Base) Mcg/act Aers (Albuterol sulfate)  .Marland Kitchen... 2 puffs 4 x a day as needed    Dulera 100-5 Mcg/act Aero (Mometasone furo-formoterol fum) .Marland Kitchen... 2 puffs two times a day  Complete Medication List: 1)  Detrol La 4 Mg Cp24 (Tolterodine tartrate) .Marland Kitchen.. 1 by mouth once daily 2)  Gabapentin 300 Mg Caps (Gabapentin) .... Take once daily 3)  Proventil Hfa 108 (90 Base) Mcg/act Aers (Albuterol sulfate) .... 2 puffs 4 x a day as needed 4)  Provigil 200 Mg Tabs (Modafinil) .Marland Kitchen.. 1 by mouth once daily 5)  Prilosec 20 Mg Cpdr (Omeprazole) .Marland Kitchen.. 1 by mouth once daily 6)  Cpap  7)  Klor-con M20 20 Meq Cr-tabs (Potassium chloride crys cr) .... Take 1 tab once daily 8)  Oxycodone-acetaminophen 5-325 Mg Tabs (Oxycodone-acetaminophen) 9)  Dulera 100-5 Mcg/act Aero (Mometasone furo-formoterol fum) .... 2 puffs two times a day 10)  Fluconazole 150 Mg Tabs (Fluconazole) .Marland Kitchen.. 1 by mouth once daily x1,  may repeat in 3 days as needed 11)  Macrobid 100 Mg Caps (Nitrofurantoin monohyd macro) .... Take 1 by mouth two times a day for 7 days 12)  Amoxicillin 500 Mg Tabs (Amoxicillin) .... 2 pills two times a day x10 days  Patient Instructions: 1)  Take the Amoxicillin as directed for your ear infection 2)  Mucinex will thin your congestion 3)  Use your inhaler to open your lungs 4)  Advil as needed for  pain or fever 5)  Call with any questions or concerns 6)  Hang in there! Prescriptions: GABAPENTIN 300 MG CAPS (GABAPENTIN) Take once daily  #30 x 0   Entered and Authorized by:   Neena Rhymes MD   Signed by:   Neena Rhymes MD on 08/02/2009   Method used:   Electronically to        [facility] [phone]* (retail)       [address]       [place], [place]  [address]       Ph: [phone]       Fax: [phone]   RxID:   [id number] PROVENTIL HFA 108 (90 BASE) MCG/ACT  AERS (ALBUTEROL SULFATE) 2 puffs 4 x a day as needed  #1 x 2   Entered and Authorized by:   Neena Rhymes MD   Signed by:   Neena Rhymes MD on 08/02/2009   Method used:    Electronically to        [facility] [phone]* (retail)       [address]       [place], [place]  [address]       Ph: [phone]       Fax: [phone]   RxID:   [id number] AMOXICILLIN 500 MG TABS (AMOXICILLIN) 2 pills two times a day x10 days  #40 x 0   Entered and Authorized by:   Neena Rhymes MD   Signed by:   Neena Rhymes MD on 08/02/2009   Method used:   Electronically to        [facility] [phone]* (retail)       [address]       [place], [place]  [address]       Ph: [phone]       Fax: [phone]   RxID:   [id number]

## 2010-03-05 NOTE — Consult Note (Signed)
Summary: Duard Larsen MD Dermatology  Duard Larsen MD Dermatology   Imported By: Lanelle Bal 06/18/2009 13:32:04  _____________________________________________________________________  External Attachment:    Type:   Image     Comment:   External Document

## 2010-03-05 NOTE — Progress Notes (Signed)
Summary: lab results  Phone Note Outgoing Call   Call placed by: [facility] CMA,  April 29, 2009 10:26 AM Summary of Call: DISCUSS WITH PATIENT, rc sent to pharmacy............Marland KitchenFelecia Deloach CMA  April 29, 2009 10:26 AM     New/Updated Medications: MACROBID 100 MG CAPS (NITROFURANTOIN MONOHYD MACRO) Take 1 by mouth two times a day for 7 days Prescriptions: MACROBID 100 MG CAPS (NITROFURANTOIN MONOHYD MACRO) Take 1 by mouth two times a day for 7 days  #14 x 0   Entered by:   Jeremy Johann CMA   Authorized by:   Loreen Freud DO   Signed by:   Jeremy Johann CMA on 04/29/2009   Method used:   Faxed to ...       [facility]  [address] [phone]* (retail)       [address]       [place], [place]  [address]       Ph: [phone]       Fax: [phone]   RxID:   [id number]

## 2010-03-05 NOTE — Progress Notes (Signed)
Summary: SICK--FLU??   OR COUGH AND COLD ??  Phone Note Call from Patient Call back at Home Phone [phone]   Caller: Patient Summary of Call: FLU LIKE SYMPTOMS SINCE LAST WEEK, ACHY, SWEATING, SLEEPING ALL DAY, HEAVY COUGHING,  COLD  SHE HAS ASTHMA, ALLERGIC TO CODEINE AND SULFA  HAS BEEN TAKING CORICIDIN FOR HIGH BLOOD PRESSURE PROPLE---SHE SAID IT SEEMS TO HELP, BUT SHE STILL SOUNDS BAD OVER THE PHONE WHEN SHE COUGHS AND SHE SAYS " I CANT JUST LIVE ON THIS MEDICINE"  SHE USES [facility] ON [address] IN [place] Initial call taken by: Jerolyn Shin,  March 19, 2009 3:42 PM  Follow-up for Phone Call        Made appt for pt to come in to discuss with dr. Laury Axon. Army Fossa CMA  March 19, 2009 4:58 PM

## 2010-03-05 NOTE — Assessment & Plan Note (Signed)
Summary: OK per Dr.Lowne, Examine Toe Nail-Injured/scm   Vital Signs:  Patient profile:   50 year old female Weight:      219 pounds Temp:     98.3 degrees F oral BP sitting:   120 / 70  (right arm)  Vitals Entered By: Almeta Monas CMA Duncan Dull) (September 27, 2009 11:33 AM) CC: c/o broken left big toe and pain to both ears, never took meds from last visit   History of Present Illness: Pt here to have me look at R toenail.  Someone stepped on foot at wedding and nail broke. Pt also never took abx from last visit and ear is still bothering her.  She said it said not to take if you had liver or breathing problems.     Current Medications (verified): 1)  Gabapentin 300 Mg Caps (Gabapentin) .... Take Once Daily 2)  Proventil Hfa 108 (90 Base) Mcg/act  Aers (Albuterol Sulfate) .... 2 Puffs 4 X A Day As Needed 3)  Provigil 200 Mg  Tabs (Modafinil) .Marland Kitchen.. 1 By Mouth Once Daily 4)  Prilosec 20 Mg  Cpdr (Omeprazole) .Marland Kitchen.. 1 By Mouth Once Daily 5)  Cpap 6)  Klor-Con M20 20 Meq Cr-Tabs (Potassium Chloride Crys Cr) .... Take 1 Tab Once Daily 7)  Floxin Otic .Marland Kitchen.. 10 Gtts L Ear Once Daily  Allergies (verified): 1)  ! Codeine 2)  ! Sulfa  Past History:  Past medical, surgical, family and social histories (including risk factors) reviewed for relevance to current acute and chronic problems.  Past Medical History: Reviewed history from 10/08/2006 and no changes required. Asthma GERD Hepatitis C DVT, hx of  Past Surgical History: Reviewed history from 02/17/2007 and no changes required. Cholecystectomy Hysterectomy, ?oophorectomy  Family History: Reviewed history from 10/08/2006 and no changes required. Family History of Alcoholism/Addiction Family History Breast cancer 1st degree relative <50 Family History of CAD Female 1st degree relative <60 Family History Diabetes 1st degree relative  Social History: Reviewed history from 10/08/2006 and no changes required. Divorced Never  Smoked Alcohol use-no Drug use-no Regular exercise-yes  Review of Systems      See HPI  Physical Exam  General:  Well-developed,well-nourished,in no acute distress; alert,appropriate and cooperative throughout examination Ears:  External ear exam shows no significant lesions or deformities.  Otoscopic examination reveals clear canals, tympanic membranes are intact bilaterally without bulging, retraction, inflammation or discharge. Hearing is grossly normal bilaterally.  L ear canal slightly red. Msk:  L big toenail---+ crack inner upper corner of nail  Psych:  Oriented X3 and normally interactive.     Impression & Recommendations:  Problem # 1:  TOE PAIN (ICD-729.5) Pain secondary to broken nail it was cleaned well ---foot soaked in H2O2 and nail covered to protect pt will cut nail as it grows out rto if any concerns  Problem # 2:  OTITIS EXTERNA, ACUTE, LEFT (ICD-380.12) floxin otic  Complete Medication List: 1)  Gabapentin 300 Mg Caps (Gabapentin) .... Take once daily 2)  Proventil Hfa 108 (90 Base) Mcg/act Aers (Albuterol sulfate) .... 2 puffs 4 x a day as needed 3)  Provigil 200 Mg Tabs (Modafinil) .Marland Kitchen.. 1 by mouth once daily 4)  Prilosec 20 Mg Cpdr (Omeprazole) .Marland Kitchen.. 1 by mouth once daily 5)  Cpap  6)  Klor-con M20 20 Meq Cr-tabs (Potassium chloride crys cr) .... Take 1 tab once daily 7)  Floxin Otic  .Marland Kitchen.. 10 gtts l ear once daily Prescriptions: FLOXIN OTIC 10 gtts L ear once daily  #  7 days x 0   Entered and Authorized by:   Loreen Freud DO   Signed by:   Loreen Freud DO on 09/27/2009   Method used:   Faxed to ...       [facility]  [address] [phone]* (retail)       [address]       [place], [place]  [address]       Ph: [phone]       Fax: [phone]   RxID:   [id number]

## 2010-03-05 NOTE — Progress Notes (Signed)
Summary: burning sensation  Phone Note Call from Patient   Caller: Patient Summary of Call: pt left VM that she is experiencing a burning sensation in her vaginal area and would like a call back left message to call office. ..........Marland KitchenFelecia Deloach CMA  February 04, 2009 2:17 PM   pt c/o vaginal discharge(white), odor,and constant burning sensation x1week with symptoms gradually increasing. Pt also c/o rectal pain as well x1week.  pt denies any fever, abdominal pain or tenderness, pain with urination. pt has pending appt already for tomorrow but advise if symptoms worsen or change she needs to be seen at [facility].........................Marland KitchenFelecia Deloach CMA  February 04, 2009 2:50 PM

## 2010-03-05 NOTE — Consult Note (Signed)
Summary: [facility]  [facility]   Imported By: Lanelle Bal 07/20/2009 08:30:58  _____________________________________________________________________  External Attachment:    Type:   Image     Comment:   External Document

## 2010-03-05 NOTE — Letter (Signed)
Summary: [facility]  [facility]   Imported By: Lanelle Bal 09/12/2009 09:25:06  _____________________________________________________________________  External Attachment:    Type:   Image     Comment:   External Document

## 2010-03-05 NOTE — Progress Notes (Signed)
Summary: Lab Results   Phone Note Outgoing Call   Summary of Call: Regarding lab results, LMTCB:  GC/Chlamydia:  normal Signed by Loreen Freud DO on 02/08/2009 at 11:03 AM Initial call taken by: Army Fossa CMA,  February 11, 2009 8:51 AM  Follow-up for Phone Call        pt is aware. Army Fossa CMA  February 11, 2009 2:56 PM

## 2010-03-05 NOTE — Assessment & Plan Note (Signed)
Summary: burning censation in vagina area/kdc   Vital Signs:  Patient profile:   50 year old female Height:      63.75 inches Weight:      212 pounds BMI:     36.81 Temp:     97.7 degrees F oral Pulse rate:   76 / minute Pulse rhythm:   regular BP sitting:   128 / 80  (left arm) Cuff size:   large  Vitals Entered By: Army Fossa CMA (February 05, 2009 1:36 PM) CC: Pt c/o burning when urinating, possible yeast infection. , Dysuria   History of Present Illness:  Dysuria      This is a 50 year old woman who presents with Dysuria.  The patient complains of burning with urination, vaginal discharge, and vaginal itching, but denies urinary frequency, urgency, and vaginal sores.  The patient denies the following associated symptoms: nausea, vomiting, fever, shaking chills, flank pain, abdominal pain, back pain, pelvic pain, and arthralgias.  Risk factors for urinary tract infection include history of STD.  The patient denies the following risk factors: diabetes, prior antibiotics, immunosuppression, history of GU anomaly, history of pyelonephritis, pregnancy, and analgesic abuse.    Allergies: 1)  ! Codeine 2)  ! Sulfa  Past History:  Past medical, surgical, family and social histories (including risk factors) reviewed for relevance to current acute and chronic problems.  Past Medical History: Reviewed history from 10/08/2006 and no changes required. Asthma GERD Hepatitis C DVT, hx of  Past Surgical History: Reviewed history from 02/17/2007 and no changes required. Cholecystectomy Hysterectomy, ?oophorectomy  Family History: Reviewed history from 10/08/2006 and no changes required. Family History of Alcoholism/Addiction Family History Breast cancer 1st degree relative <50 Family History of CAD Female 1st degree relative <60 Family History Diabetes 1st degree relative  Social History: Reviewed history from 10/08/2006 and no changes required. Divorced Never  Smoked Alcohol use-no Drug use-no Regular exercise-yes  Review of Systems      See HPI  Physical Exam  General:  Well-developed,well-nourished,in no acute distress; alert,appropriate and cooperative throughout examination Abdomen:  Bowel sounds positive,abdomen soft and non-tender without masses, organomegaly or hernias noted. Genitalia:  normal introitus, no external lesions, and vaginal discharge.  + thin d/c, white, + odor  Psych:  Oriented X3 and normally interactive.     Impression & Recommendations:  Problem # 1:  BACTERIAL VAGINITIS (ICD-616.10)  Her updated medication list for this problem includes:    Metronidazole 500 Mg Tabs (Metronidazole) .Marland Kitchen... 1 tab by mouth two times a day x7 days    Metrogel-vaginal 0.75 % Gel (Metronidazole) .Marland Kitchen... 1 applicator pv at bedtime x5 nights  Discussed symptomatic relief and treatment options.   Orders: T-Chlamydia  Probe, urine [phone]) T-GC Probe, urine [phone] T-Urine Microscopic [phone]) T-Culture, Urine ([id number]) Wet Prep ([id number])  Complete Medication List: 1)  Detrol La 4 Mg Cp24 (Tolterodine tartrate) .Marland Kitchen.. 1 by mouth once daily 2)  Gabapentin 300 Mg Caps (Gabapentin) .... Take once daily 3)  Proventil Hfa 108 (90 Base) Mcg/act Aers (Albuterol sulfate) .... 2 puffs 4 x a day as needed 4)  Provigil 200 Mg Tabs (Modafinil) .Marland Kitchen.. 1 by mouth once daily 5)  Prilosec 20 Mg Cpdr (Omeprazole) .Marland Kitchen.. 1 by mouth once daily 6)  Cpap  7)  Valtrex 1 Gm Tabs (Valacyclovir hcl) .Marland Kitchen.. 1 by mouth three times a day for 10 days 8)  Ultram 50 Mg Tabs (Tramadol hcl) .Marland Kitchen.. 1 by mouth every 6 hours as needed 9)  Klor-con M20 20 Meq Cr-tabs (Potassium chloride crys cr) .... Take 1 tab once daily 10)  Metronidazole 500 Mg Tabs (Metronidazole) .Marland Kitchen.. 1 tab by mouth two times a day x7 days 11)  Fluconazole 150 Mg Tabs (Fluconazole) .Marland Kitchen.. 1 tab by mouth x 1- may repeat in 72 hours if still having symptoms 12)  Wheelchair  .... As  directed 13)  Oxycodone-acetaminophen 5-325 Mg Tabs (Oxycodone-acetaminophen) 14)  Methocarbamol 500 Mg Tabs (Methocarbamol) 15)  Metrogel-vaginal 0.75 % Gel (Metronidazole) .Marland Kitchen.. 1 applicator pv at bedtime x5 nights Prescriptions: METROGEL-VAGINAL 0.75 % GEL (METRONIDAZOLE) 1 applicator pv at bedtime x5 nights  #5 x 0   Entered and Authorized by:   Loreen Freud DO   Signed by:   Loreen Freud DO on 02/05/2009   Method used:   Electronically to        [facility] [phone]* (retail)       [address]       [place], [place]  [address]       Ph: [phone]       Fax: [phone]   RxID:   [id number]   Laboratory Results   Urine Tests    Routine Urinalysis   Color: yellow Appearance: Clear Glucose: negative   (Normal Range: Negative) Bilirubin: negative   (Normal Range: Negative) Ketone: negative   (Normal Range: Negative) Spec. Gravity: 1.015   (Normal Range: 1.003-1.035) Blood: negative   (Normal Range: Negative) pH: 6.5   (Normal Range: 5.0-8.0) Protein: negative   (Normal Range: Negative) Urobilinogen: 0.2   (Normal Range: 0-1) Nitrite: negative   (Normal Range: Negative) Leukocyte Esterace: negative   (Normal Range: Negative)    Comments: Army Fossa CMA  February 05, 2009 2:33 PM

## 2010-03-05 NOTE — Assessment & Plan Note (Signed)
Summary: pain in vagina/kdc   Vital Signs:  Patient profile:   50 year old female Weight:      214 pounds Temp:     98.6 degrees F oral Pulse rate:   72 / minute BP sitting:   118 / 78  (left arm)  Vitals Entered By: Jeremy Johann CMA (April 22, 2009 3:14 PM) CC: pain in vaginal area Comments REVIEWED MED LIST, PATIENT AGREED DOSE AND INSTRUCTION CORRECT    History of Present Illness: Pt here c/o pain with intercourse.  She denies any dysuria or discharge. No abd pain.  No other complaints.    Current Medications (verified): 1)  Detrol La 4 Mg Cp24 (Tolterodine Tartrate) .Marland Kitchen.. 1 By Mouth Once Daily 2)  Gabapentin 300 Mg Caps (Gabapentin) .... Take Once Daily 3)  Proventil Hfa 108 (90 Base) Mcg/act  Aers (Albuterol Sulfate) .... 2 Puffs 4 X A Day As Needed 4)  Provigil 200 Mg  Tabs (Modafinil) .Marland Kitchen.. 1 By Mouth Once Daily 5)  Prilosec 20 Mg  Cpdr (Omeprazole) .Marland Kitchen.. 1 By Mouth Once Daily 6)  Cpap 7)  Klor-Con M20 20 Meq Cr-Tabs (Potassium Chloride Crys Cr) .... Take 1 Tab Once Daily 8)  Oxycodone-Acetaminophen 5-325 Mg Tabs (Oxycodone-Acetaminophen) 9)  Dulera 100-5 Mcg/act Aero (Mometasone Furo-Formoterol Fum) .... 2 Puffs Two Times A Day 10)  Flagyl 500 Mg Tabs (Metronidazole) .Marland Kitchen.. 1 By Mouth Two Times A Day 11)  Fluconazole 150 Mg Tabs (Fluconazole) .Marland Kitchen.. 1 By Mouth Once Daily X1,  May Repeat in 3 Days As Needed  Allergies: 1)  ! Codeine 2)  ! Sulfa  Past History:  Past medical, surgical, family and social histories (including risk factors) reviewed for relevance to current acute and chronic problems.  Past Medical History: Reviewed history from 10/08/2006 and no changes required. Asthma GERD Hepatitis C DVT, hx of  Past Surgical History: Reviewed history from 02/17/2007 and no changes required. Cholecystectomy Hysterectomy, ?oophorectomy  Family History: Reviewed history from 10/08/2006 and no changes required. Family History of Alcoholism/Addiction Family  History Breast cancer 1st degree relative <50 Family History of CAD Female 1st degree relative <60 Family History Diabetes 1st degree relative  Social History: Reviewed history from 10/08/2006 and no changes required. Divorced Never Smoked Alcohol use-no Drug use-no Regular exercise-yes  Review of Systems      See HPI  Physical Exam  General:  Well-developed,well-nourished,in no acute distress; alert,appropriate and cooperative throughout examination Ears:  External ear exam shows no significant lesions or deformities.  Otoscopic examination reveals clear canals, tympanic membranes are intact bilaterally without bulging, retraction, inflammation or discharge. Hearing is grossly normal bilaterally. Abdomen:  Bowel sounds positive,abdomen soft and non-tender without masses, organomegaly or hernias noted. Genitalia:  + white d/c + errythema no lesions no adnexal masses or tenderness.   Neurologic:  alert & oriented X3.   Psych:  Oriented X3 and normally interactive.     Impression & Recommendations:  Problem # 1:  BACTERIAL VAGINITIS (ICD-616.10)  Her updated medication list for this problem includes:    Flagyl 500 Mg Tabs (Metronidazole) .Marland Kitchen... 1 by mouth two times a day  Discussed symptomatic relief and treatment options.   Orders: Wet Prep (16109UE) UA Dipstick w/o Micro (manual) (45409)  Complete Medication List: 1)  Detrol La 4 Mg Cp24 (Tolterodine tartrate) .Marland Kitchen.. 1 by mouth once daily 2)  Gabapentin 300 Mg Caps (Gabapentin) .... Take once daily 3)  Proventil Hfa 108 (90 Base) Mcg/act Aers (Albuterol sulfate) .... 2 puffs 4  x a day as needed 4)  Provigil 200 Mg Tabs (Modafinil) .Marland Kitchen.. 1 by mouth once daily 5)  Prilosec 20 Mg Cpdr (Omeprazole) .Marland Kitchen.. 1 by mouth once daily 6)  Cpap  7)  Klor-con M20 20 Meq Cr-tabs (Potassium chloride crys cr) .... Take 1 tab once daily 8)  Oxycodone-acetaminophen 5-325 Mg Tabs (Oxycodone-acetaminophen) 9)  Dulera 100-5 Mcg/act Aero  (Mometasone furo-formoterol fum) .... 2 puffs two times a day 10)  Flagyl 500 Mg Tabs (Metronidazole) .Marland Kitchen.. 1 by mouth two times a day 11)  Fluconazole 150 Mg Tabs (Fluconazole) .Marland Kitchen.. 1 by mouth once daily x1,  may repeat in 3 days as needed  Other Orders: Urology Referral (Urology) Dermatology Referral (Derma) T-Chlamydia  Probe, urine [phone]) T-GC Probe, urine [id number]) T-Culture, Urine ([id number]) Prescriptions: FLUCONAZOLE 150 MG TABS (FLUCONAZOLE) 1 by mouth once daily x1,  may repeat in 3 days as needed  #2 x 2   Entered and Authorized by:   Loreen Freud DO   Signed by:   Loreen Freud DO on 04/22/2009   Method used:   Electronically to        [facility] [phone]* (retail)       [address]       [place], [place]  [address]       Ph: [phone]       Fax: [phone]   RxID:   [id number] FLAGYL 500 MG TABS (METRONIDAZOLE) 1 by mouth two times a day  #14 x 0   Entered and Authorized by:   Loreen Freud DO   Signed by:   Loreen Freud DO on 04/22/2009   Method used:   Electronically to        [facility] [phone]* (retail)       [address]       [place], [place]  [address]       Ph: [phone]       Fax: [phone]   RxID:   [id number]

## 2010-03-05 NOTE — Assessment & Plan Note (Signed)
Summary: flu?/drb   Vital Signs:  Patient profile:   50 year old female Weight:      211 pounds Temp:     98.0 degrees F oral Pulse rate:   78 / minute Pulse rhythm:   regular BP sitting:   128 / 82  (left arm) Cuff size:   large  Vitals Entered By: Army Fossa CMA (March 20, 2009 11:41 AM) CC: Pt c/o HA started thursday, nasal congestion, chest congestion, body aches, unsure if she had a fever. , Cough   History of Present Illness:  Cough      This is a 50 year old woman who presents with Cough.  The symptoms began 6 days ago.  The patient reports productive cough, wheezing, and fever.  Associated symtpoms include sore throat and nasal congestion.  Ineffective prior treatments have included OTC cough medication.    Current Medications (verified): 1)  Detrol La 4 Mg Cp24 (Tolterodine Tartrate) .Marland Kitchen.. 1 By Mouth Once Daily 2)  Gabapentin 300 Mg Caps (Gabapentin) .... Take Once Daily 3)  Proventil Hfa 108 (90 Base) Mcg/act  Aers (Albuterol Sulfate) .... 2 Puffs 4 X A Day As Needed 4)  Provigil 200 Mg  Tabs (Modafinil) .Marland Kitchen.. 1 By Mouth Once Daily 5)  Prilosec 20 Mg  Cpdr (Omeprazole) .Marland Kitchen.. 1 By Mouth Once Daily 6)  Cpap 7)  Klor-Con M20 20 Meq Cr-Tabs (Potassium Chloride Crys Cr) .... Take 1 Tab Once Daily 8)  Oxycodone-Acetaminophen 5-325 Mg Tabs (Oxycodone-Acetaminophen) 9)  Augmentin 875-125 Mg Tabs (Amoxicillin-Pot Clavulanate) .Marland Kitchen.. 1 By Mouth Two Times A Day 10)  Dulera 100-5 Mcg/act Aero (Mometasone Furo-Formoterol Fum) .... 2 Puffs Two Times A Day  Allergies (verified): 1)  ! Codeine 2)  ! Sulfa  Past History:  Past medical, surgical, family and social histories (including risk factors) reviewed for relevance to current acute and chronic problems.  Past Medical History: Reviewed history from 10/08/2006 and no changes required. Asthma GERD Hepatitis C DVT, hx of  Past Surgical History: Reviewed history from 02/17/2007 and no changes  required. Cholecystectomy Hysterectomy, ?oophorectomy  Family History: Reviewed history from 10/08/2006 and no changes required. Family History of Alcoholism/Addiction Family History Breast cancer 1st degree relative <50 Family History of CAD Female 1st degree relative <60 Family History Diabetes 1st degree relative  Social History: Reviewed history from 10/08/2006 and no changes required. Divorced Never Smoked Alcohol use-no Drug use-no Regular exercise-yes  Review of Systems      See HPI  Physical Exam  General:  Well-developed,well-nourished,in no acute distress; alert,appropriate and cooperative throughout examination Ears:  External ear exam shows no significant lesions or deformities.  Otoscopic examination reveals clear canals, tympanic membranes are intact bilaterally without bulging, retraction, inflammation or discharge. Hearing is grossly normal bilaterally. Nose:  External nasal examination shows no deformity or inflammation. Nasal mucosa are pink and moist without lesions or exudates. Mouth:  Oral mucosa and oropharynx without lesions or exudates.  Teeth in good repair. Neck:  No deformities, masses, or tenderness noted. Lungs:  R decreased breath sounds, R wheezes, L decreased breath sounds, and L wheezes.   Heart:  normal rate and no murmur.   Psych:  Cognition and judgment appear intact. Alert and cooperative with normal attention span and concentration. No apparent delusions, illusions, hallucinations   Impression & Recommendations:  Problem # 1:  BRONCHITIS- ACUTE (ICD-466.0)  The following medications were removed from the medication list:    Metronidazole 500 Mg Tabs (Metronidazole) .Marland Kitchen... 1 tab by  mouth two times a day x7 days Her updated medication list for this problem includes:    Proventil Hfa 108 (90 Base) Mcg/act Aers (Albuterol sulfate) .Marland Kitchen... 2 puffs 4 x a day as needed    Augmentin 875-125 Mg Tabs (Amoxicillin-pot clavulanate) .Marland Kitchen... 1 by mouth two  times a day    Dulera 100-5 Mcg/act Aero (Mometasone furo-formoterol fum) .Marland Kitchen... 2 puffs two times a day  Take antibiotics and other medications as directed. Encouraged to push clear liquids, get enough rest, and take acetaminophen as needed. To be seen in 5-7 days if no improvement, sooner if worse.  Complete Medication List: 1)  Detrol La 4 Mg Cp24 (Tolterodine tartrate) .Marland Kitchen.. 1 by mouth once daily 2)  Gabapentin 300 Mg Caps (Gabapentin) .... Take once daily 3)  Proventil Hfa 108 (90 Base) Mcg/act Aers (Albuterol sulfate) .... 2 puffs 4 x a day as needed 4)  Provigil 200 Mg Tabs (Modafinil) .Marland Kitchen.. 1 by mouth once daily 5)  Prilosec 20 Mg Cpdr (Omeprazole) .Marland Kitchen.. 1 by mouth once daily 6)  Cpap  7)  Klor-con M20 20 Meq Cr-tabs (Potassium chloride crys cr) .... Take 1 tab once daily 8)  Oxycodone-acetaminophen 5-325 Mg Tabs (Oxycodone-acetaminophen) 9)  Augmentin 875-125 Mg Tabs (Amoxicillin-pot clavulanate) .Marland Kitchen.. 1 by mouth two times a day 10)  Dulera 100-5 Mcg/act Aero (Mometasone furo-formoterol fum) .... 2 puffs two times a day  Other Orders: Admin of Therapeutic Inj  intramuscular or subcutaneous (04540) Depo- Medrol 80mg  (J1040) Prescriptions: AUGMENTIN 875-125 MG TABS (AMOXICILLIN-POT CLAVULANATE) 1 by mouth two times a day  #20 x 0   Entered and Authorized by:   Loreen Freud DO   Signed by:   Loreen Freud DO on 03/20/2009   Method used:   Electronically to        [facility] [phone]* (retail)       [address]       [place], [place]  [address]       Ph: [phone]       Fax: [phone]   RxID:   [id number]    Medication Administration  Injection # 1:    Medication: Depo- Medrol 80mg     Diagnosis: ASTHMA (ICD-493.90)    Route: IM    Site: RUOQ gluteus    Exp Date: 11/2009    Lot #: [id number]    Mfr: novaplus    Patient tolerated injection without complications    Given by: Army Fossa CMA (March 20, 2009 12:15 PM)  Orders Added: 1)  Admin of Therapeutic  Inj  intramuscular or subcutaneous [96372] 2)  Depo- Medrol 80mg  [J1040] 3)  Est. Patient Level III [64403]

## 2010-03-05 NOTE — Letter (Signed)
Summary: [facility]  [facility]   Imported By: Lanelle Bal 08/01/2009 15:07:18  _____________________________________________________________________  External Attachment:    Type:   Image     Comment:   External Document

## 2010-03-05 NOTE — Progress Notes (Signed)
Summary: still no better  Phone Note Call from Patient Call back at Home Phone [phone]   Caller: Patient Summary of Call: pt still c/o congestion, drainage and pain in left ear. pt last seen 08-02-09 pt use [facility] [address]. pls advise ............Marland KitchenFelecia Deloach CMA  August 20, 2009 2:10 PM   Follow-up for Phone Call        ceftin 500 mg 1 by mouth two times a day for 10 days  Follow-up by: Loreen Freud DO,  August 20, 2009 2:33 PM  Additional Follow-up for Phone Call Additional follow up Details #1::        pt coming in today will inform pt then...............Marland KitchenFelecia Deloach CMA  August 20, 2009 2:54 PM     New/Updated Medications: CEFTIN 500 MG TABS (CEFUROXIME AXETIL) Take 1 by mouth two times a day for 10 days Prescriptions: CEFTIN 500 MG TABS (CEFUROXIME AXETIL) Take 1 by mouth two times a day for 10 days  #10 x 0   Entered by:   Jeremy Johann CMA   Authorized by:   Loreen Freud DO   Signed by:   Jeremy Johann CMA on 08/20/2009   Method used:   Faxed to ...       [facility]  [address] [phone]* (retail)       [address]       [place], [place]  [address]       Ph: [phone]       Fax: [phone]   RxID:   [id number]

## 2010-03-06 NOTE — Assessment & Plan Note (Signed)
Summary: ? ABOUT BP READING //PH   Vital Signs:  Patient profile:   50 year old female Height:      63.75 inches Weight:      222.4 pounds BMI:     38.61 Pulse rate:   72 / minute Pulse rhythm:   regular BP sitting:   140 / 88  (right arm) Cuff size:   large  Vitals Entered By: Almeta Monas CMA Duncan Dull) (February 06, 2010 2:48 PM) CC: BP check---per pt BP was high at the eye doctors office today   Serial Vital Signs/Assessments:  Time      Position  BP       Pulse  Resp  Temp     By 2:48 PM   L Arm     150/90                         Almeta Monas CMA (AAMA)   History of Present Illness: Pt was in eye dr office and her bp was high.  Pt with no sob, cp, headache etc.     Current Medications (verified): 1)  Gabapentin 300 Mg Caps (Gabapentin) .... Take Once Daily 2)  Proventil Hfa 108 (90 Base) Mcg/act  Aers (Albuterol Sulfate) .... 2 Puffs 4 X A Day As Needed 3)  Provigil 200 Mg  Tabs (Modafinil) .Marland Kitchen.. 1 By Mouth Once Daily 4)  Prilosec 20 Mg  Cpdr (Omeprazole) .Marland Kitchen.. 1 By Mouth Once Daily 5)  Cpap 6)  Lisinopril 10 Mg Tabs (Lisinopril) .Marland Kitchen.. 1 By Mouth Once Daily 7)  Aldactone 25 Mg Tabs (Spironolactone) .Marland Kitchen.. 1 By Mouth Once Daily 8)  Nebulizer Compressor  Misc (Nebulizers) .... As Directed 9)  Albuterol Sulfate (2.5 Mg/34ml) 0.083% Nebu (Albuterol Sulfate) .... Use As Directed As Needed For Asthma  Allergies (verified): 1)  ! Codeine 2)  ! Sulfa 3)  ! Rebif (Interferon Beta-1a)  Past History:  Past medical, surgical, family and social histories (including risk factors) reviewed for relevance to current acute and chronic problems.  Past Medical History: Reviewed history from 10/08/2006 and no changes required. Asthma GERD Hepatitis C DVT, hx of  Past Surgical History: Reviewed history from 02/17/2007 and no changes required. Cholecystectomy Hysterectomy, ?oophorectomy  Family History: Reviewed history from 10/08/2006 and no changes required. Family History of  Alcoholism/Addiction Family History Breast cancer 1st degree relative <50 Family History of CAD Female 1st degree relative <60 Family History Diabetes 1st degree relative  Social History: Reviewed history from 10/08/2006 and no changes required. Divorced Never Smoked Alcohol use-no Drug use-no Regular exercise-yes  Review of Systems      See HPI  Physical Exam  General:  Well-developed,well-nourished,in no acute distress; alert,appropriate and cooperative throughout examination Lungs:  Normal respiratory effort, chest expands symmetrically. Lungs are clear to auscultation, no crackles or wheezes. Heart:  normal rate and no murmur.   Extremities:  left pretibial edema and right pretibial edema.   Psych:  Oriented X3 and normally interactive.     Impression & Recommendations:  Problem # 1:  UNSPECIFIED ESSENTIAL HYPERTENSION (ICD-401.9)  Her updated medication list for this problem includes:    Lisinopril 10 Mg Tabs (Lisinopril) .Marland Kitchen... 1 by mouth once daily    Aldactone 25 Mg Tabs (Spironolactone) .Marland Kitchen... 1 by mouth once daily  Orders: TLB-BMP (Basic Metabolic Panel-BMET) (80048-METABOL) Specimen Handling (16109)  Problem # 2:  HEPATITIS C (ICD-070.51) per [facility]   Complete Medication List: 1)  Gabapentin 300 Mg  Caps (Gabapentin) .... Take once daily 2)  Proventil Hfa 108 (90 Base) Mcg/act Aers (Albuterol sulfate) .... 2 puffs 4 x a day as needed 3)  Provigil 200 Mg Tabs (Modafinil) .Marland Kitchen.. 1 by mouth once daily 4)  Prilosec 20 Mg Cpdr (Omeprazole) .Marland Kitchen.. 1 by mouth once daily 5)  Cpap  6)  Lisinopril 10 Mg Tabs (Lisinopril) .Marland Kitchen.. 1 by mouth once daily 7)  Aldactone 25 Mg Tabs (Spironolactone) .Marland Kitchen.. 1 by mouth once daily 8)  Nebulizer Compressor Misc (Nebulizers) .... As directed 9)  Albuterol Sulfate (2.5 Mg/83ml) 0.083% Nebu (Albuterol sulfate) .... Use as directed as needed for asthma  Patient Instructions: 1)  Please schedule a follow-up appointment in 2 weeks.    Prescriptions: ALBUTEROL SULFATE (2.5 MG/3ML) 0.083% NEBU (ALBUTEROL SULFATE) use as directed as needed for asthma  #1box x 1   Entered by:   Almeta Monas CMA (AAMA)   Authorized by:   Loreen Freud DO   Signed by:   Almeta Monas CMA (AAMA) on 02/06/2010   Method used:   Faxed to ...       [facility]  [address] [phone]* (retail)       [address]       [place], [place]  [address]       Ph: [phone]       Fax: [phone]   RxID:   [id number] NEBULIZER COMPRESSOR  MISC (NEBULIZERS) as directed  #1 x 0   Entered by:   Almeta Monas CMA (AAMA)   Authorized by:   Loreen Freud DO   Signed by:   Almeta Monas CMA (AAMA) on 02/06/2010   Method used:   Print then Give to Patient   RxID:   [id number] ALDACTONE 25 MG TABS (SPIRONOLACTONE) 1 by mouth once daily  #30 x 2   Entered and Authorized by:   Loreen Freud DO   Signed by:   Loreen Freud DO on 02/06/2010   Method used:   Electronically to        [facility] [phone]* (retail)       [address]       [place], [place]  [address]       Ph: [phone]       Fax: [phone]   RxID:   [id number] LISINOPRIL 10 MG TABS (LISINOPRIL) 1 by mouth once daily  #30 x 2   Entered and Authorized by:   Loreen Freud DO   Signed by:   Loreen Freud DO on 02/06/2010   Method used:   Electronically to        [facility] [phone]* (retail)       [address]       [place], [place]  [address]       Ph: [phone]       Fax: [phone]   RxID:   [id number]    Orders Added: 1)  TLB-BMP (Basic Metabolic Panel-BMET) [80048-METABOL] 2)  Est. Patient Level III [18299] 3)  Specimen Handling [99000]

## 2010-03-06 NOTE — Medication Information (Signed)
Summary: Approval for Albuterol/[facility]  Approval for Albuterol/[facility]   Imported By: Lanelle Bal 02/17/2010 10:24:30  _____________________________________________________________________  External Attachment:    Type:   Image     Comment:   External Document

## 2010-03-06 NOTE — Progress Notes (Signed)
Summary: Prior Auth APPROVED albuterol  Phone Note Refill Request Call back at [phone] Message from:  Pharmacy on February 07, 2010 8:44 AM  Refills Requested: Medication #1:  ALBUTEROL SULFATE (2.5 MG/3ML) 0.083% NEBU use as directed as needed for asthma.   Dosage confirmed as above?Dosage Confirmed Prior Auth from [employer] on [address]   Initial call taken by: Harold Barban,  February 07, 2010 8:45 AM  Follow-up for Phone Call        Prior auth approved 02-02-10 until 02-23-11, pharmacy notified, awaiting approval letter.........Marland KitchenFelecia Deloach CMA  February 07, 2010 4:38 PM

## 2010-03-20 NOTE — Letter (Signed)
Summary: [facility]  [facility]   Imported By: Kassie Mends 03/13/2010 11:08:19  _____________________________________________________________________  External Attachment:    Type:   Image     Comment:   External Document

## 2010-04-14 LAB — URINALYSIS, ROUTINE W REFLEX MICROSCOPIC
Bilirubin Urine: NEGATIVE
Hgb urine dipstick: NEGATIVE
Ketones, ur: NEGATIVE mg/dL
Specific Gravity, Urine: 1.025 (ref 1.005–1.030)
pH: 6 (ref 5.0–8.0)

## 2010-04-14 LAB — GC/CHLAMYDIA PROBE AMP, GENITAL: Chlamydia, DNA Probe: NEGATIVE

## 2010-04-14 LAB — WET PREP, GENITAL: WBC, Wet Prep HPF POC: NONE SEEN

## 2010-04-14 LAB — URINE CULTURE

## 2010-04-14 LAB — RPR: RPR Ser Ql: NONREACTIVE

## 2010-04-14 LAB — HIV ANTIBODY (ROUTINE TESTING W REFLEX): HIV: NONREACTIVE

## 2010-05-08 LAB — PROTIME-INR
INR: 4.3 — ABNORMAL HIGH (ref 0.00–1.49)
Prothrombin Time: 40.9 seconds — ABNORMAL HIGH (ref 11.6–15.2)

## 2010-05-09 LAB — HEPATIC FUNCTION PANEL
ALT: 84 U/L — ABNORMAL HIGH (ref 0–35)
Albumin: 3.6 g/dL (ref 3.5–5.2)
Alkaline Phosphatase: 85 U/L (ref 39–117)
Total Bilirubin: 0.8 mg/dL (ref 0.3–1.2)
Total Protein: 7 g/dL (ref 6.0–8.3)

## 2010-05-09 LAB — URINALYSIS, ROUTINE W REFLEX MICROSCOPIC
Glucose, UA: NEGATIVE mg/dL
Hgb urine dipstick: NEGATIVE
Specific Gravity, Urine: 1.023 (ref 1.005–1.030)
pH: 6.5 (ref 5.0–8.0)

## 2010-05-09 LAB — DIFFERENTIAL
Eosinophils Absolute: 0.1 10*3/uL (ref 0.0–0.7)
Lymphocytes Relative: 27 % (ref 12–46)
Lymphs Abs: 1.3 10*3/uL (ref 0.7–4.0)
Monocytes Relative: 8 % (ref 3–12)
Neutrophils Relative %: 61 % (ref 43–77)

## 2010-05-09 LAB — TYPE AND SCREEN

## 2010-05-09 LAB — BASIC METABOLIC PANEL
Chloride: 102 mEq/L (ref 96–112)
Creatinine, Ser: 0.64 mg/dL (ref 0.4–1.2)
GFR calc Af Amer: >60 mL/min (ref >60)
GFR calc non Af Amer: >60 mL/min (ref >60)
Potassium: 3.4 mEq/L — ABNORMAL LOW (ref 3.5–5.1)

## 2010-05-09 LAB — CBC
HCT: 42.6 % (ref 36.0–46.0)
MCV: 94.7 fL (ref 78.0–100.0)
Platelets: 178 10*3/uL (ref 150–400)
RBC: 4.51 MIL/uL (ref 3.87–5.11)
WBC: 4.7 10*3/uL (ref 4.0–10.5)

## 2010-05-09 LAB — GLUCOSE, CAPILLARY: Glucose-Capillary: 105 mg/dL — ABNORMAL HIGH (ref 70–99)

## 2010-05-09 LAB — URINE CULTURE
Colony Count: NO GROWTH
Culture: NO GROWTH

## 2010-05-09 LAB — ABO/RH: ABO/RH(D): B POS

## 2010-05-09 LAB — PROTIME-INR: Prothrombin Time: 13 seconds (ref 11.6–15.2)

## 2010-06-19 ENCOUNTER — Telehealth: Payer: Self-pay | Admitting: Family Medicine

## 2010-06-19 NOTE — Telephone Encounter (Signed)
Call from Sarah checking the status on appt's, I advised we did not have anything available... Pt has been advised to contact her GYN for a sooner evaluation. Patient advised [place] that she already called them and is awaiting a call back        KP

## 2010-06-19 NOTE — Telephone Encounter (Signed)
error 

## 2010-06-19 NOTE — Telephone Encounter (Signed)
Patient has been having vaginal burning 1 week no other symptoms

## 2010-06-20 NOTE — Op Note (Signed)
NAME:  Molly Klein, Molly Klein NO.:  [id number]   MEDICAL RECORD NO.:  [id number]          PATIENT TYPE:  OBV   LOCATION:  9373                          FACILITY:  [facility]   PHYSICIAN:  Janine Limbo, M.D.DATE OF BIRTH:  06-25-1960   DATE OF PROCEDURE:  DATE OF DISCHARGE:                                 OPERATIVE REPORT   PREOPERATIVE DIAGNOSES:  1.  Fibroid uterus.  2.  Anemia (hemoglobin 11.7).  3.  Menometrorrhagia.  4.  Dysmenorrhea   POSTOPERATIVE DIAGNOSES:  1.  Fibroid uterus.  2.  Anemia (hemoglobin 11.7).  3.  Menometrorrhagia.  4.  Dysmenorrhea.  5.  Pelvic adhesions.   PROCEDURE:  1.  Laparoscopy assisted vaginal hysterectomy.  2.  Laparoscopic right salpingo-oophorectomy.  3.  Laparoscopic lysis of adhesions.  4.  Cystoscopy.   SURGEON:  Leonard Schwartz, M.D.   FIRST ASSISTANT:  Naima A. Normand Sloop, M.D.   ANESTHETIC:  General.   DISPOSITION:  Ms. [name] is a 50 year old female, para 0, 0-1-0, who presents  with menometrorrhagia, dysmenorrhea, and a known fibroid uterus.  She is  known to be anemic.  The patient has had a history in the past of a deep  venous thrombosis on hormone replacement therapy. The patient has a history  of hepatitis C.  The patient understands the indications for this surgical  procedure and she accepts the risks of,  but not limited to, anesthetic  complications, bleeding, infection, and possible damage to surrounding  organs.   FINDINGS:  The patient was noted to have a 10-12 weeks size fibroid uterus.  Fibroids were present on the anterior surface of the uterus, but also  intramurally.  The ovaries appeared normal except for adhesions between the  ovaries and the fallopian tubes.  The fallopian tubes appeared normal except  for adhesions between the fallopian tubes, the ovaries, and the lateral  sidewalls bilaterally.  The bowel was adhered to the left fallopian tube.  The bowel was also adhered to the right  fallopian tube into the right pelvic  sidewall. The appendix was visualized and there were adhesions noted along  the course of the appendix.  The liver was visualized and appeared normal.  There were adhesions between the liver and the lower diaphragm.  The  gallbladder was surgically absent.  There were some hyperpigmented areas  along the sigmoid colon that were thought to represent endometriosis.   DESCRIPTION OF PROCEDURE:  The patient was taken to the operating room where  a general anesthetic was given.  The patient's abdomen, perineum, and vagina  were prepped with multiple layers of Betadine. A Foley catheter was placed  in the bladder.  Examination under anesthesia was performed.  A Hulka  tenaculum was placed inside the uterus.  The patient was then sterilely  draped.  The subumbilical area was injected with 0.5% Marcaine with  epinephrine.  An incision was made and carried sharply through the  subcutaneous tissue, fascia, and the anterior peritoneum.  The Hassan  cannula was sutured into place.  The bowel was carefully inspected,  and  there was no evidence of damage.  The pelvis was inspected with findings as  mentioned above.  Two suprapubic areas were injected with a total of 6 mL of  0.5% Marcaine with epinephrine.  Two small incisions were made and two 5 mm  trocars were placed under direct visualization into the lower abdomen.  Again, the pelvis and abdomen were carefully inspected.  Pictures were taken  of the patient's anatomy.  We then began our lysis of adhesions using a  combination of sharp and blunt dissection.  Care was taken not to damage the  bowel for the vital pelvic structures.  We were able to identify the ureters  and followed the ureters along their course.  The ureters appeared to be  away from our surgical areas.  We then isolated the right round ligament. We  cauterized the right lateral round ligament as well as the right fallopian  tube and the right  utero-ovarian ligament.  The structures were cut.  We  skeletonized the right infundibulopelvic ligament.  e then used to 0 Vicryl  Endoloop sutures to secure the right infundibulopelvic ligament.  The  sutures were tied securely. The right infundibulopelvic ligament was cut,  and the fallopian tube and ovary on that side were placed in the posterior  cul-de-sac.  We then noticed some bleeding along the right pelvic sidewall.  We then used additional Endoloop sutures (3-0 PDS and 0 Vicryl) to obtain  hemostasis.  Care was taken not to damage the ureter on the right. There was  no evidence that the ureter was damaged.  We felt that hemostasis was  adequate at this point. We then began our vaginal hysterectomy portion of  the procedure. The patient was placed in a more lithotomy position. The  cervix was injected with a diluted solution of Pitressin and saline. A  circumferential incision was made around the cervix and the vaginal mucosa  was advanced anteriorly and posteriorly.  The posterior cul-de-sac was  sharply entered. Alternating from right to left, the uterosacral ligaments,  paracervical tissues, parametrial tissues, uterine arteries, and the upper  pedicles were clamped, cut, sutured, and tied securely.  The remainder of  the upper pedicle the left was then clamped and cut. The uterus was removed  from the operative field.  The upper pedicle was secured using two suture  ligatures.  The sutures attached to the uterosacral ligaments were brought  out through the vaginal angles and tied securely.  The pelvis was carefully  inspected and hemostasis was thought to be adequate.  We placed a McCall  culdoplasty suture in the posterior cul-de-sac incorporating the uterosacral  ligaments bilaterally and the posterior peritoneum.  A final check was made  for hemostasis, and again hemostasis was adequate. The vaginal cuff was then closed using figure-of-eight sutures incorporating the  anterior vaginal  mucosa, the anterior peritoneum, the posterior peritoneum, and the posterior  vaginal mucosa.  The McCall culdoplasty suture was tied, and the apex of the  vagina was noted to elevate into the mid pelvis.  We were then ready to  perform cystoscopy.  The patient was given an ampule of indigo carmine.  The  Foley catheter was removed, and the cystoscope was placed under sterile  conditions.  The ureters were identified bilaterally and pictures were taken  of blue dye passing to patent ureteral orifices.  The bladder was then  inspected and there was no evidence of damage to the bladder.  The  cystoscope was removed, and a different Foley catheter was inserted.  The  operator then changed gown and gloves.  The abdomen was reinflated.  Laparoscopy was again performed, and hemostasis was noted to be adequate  throughout the pelvis.  The pelvis was vigorously irrigated.  Again  hemostasis was confirmed. The bowel was inspected once again and there was  no evidence of damage to the bowel. Therefore we removed all instruments  under direct visualization. The subumbilical incision was closed using  figure-of-eight sutures of 2-0 Vicryl.  The subcutaneous layer was closed  using interrupted sutures of 2-0 Vicryl.  The skin of the subumbilical  incision and also the suprapubic incisions were then closed using  subcuticular sutures of 3-0 Monocryl.  Sponge, needle and instrument counts  were correct.  The estimated blood loss for the procedure  was 150 cc. The patient tolerated her procedure well.  She was awakened from  her anesthetic without difficulty and taken to the recovery room in stable  condition.  The suture material was 0 Vicryl  used throughout the procedure  except where otherwise mentioned.      Janine Limbo, M.D.  Electronically Signed     AVS/MEDQ  D:  02/18/2005  T:  02/18/2005  Job:  [id number]   cc:   Samul Dada, M.D.  Fax: [phone]   Sharon Mt, M.D.  [facility]   Lelon Perla, M.D.  [address] [place], [place] [address]

## 2010-06-20 NOTE — H&P (Signed)
NAME:  ANNE-MARIE, GENSON NO.:  [id number]   MEDICAL RECORD NO.:  [id number]           PATIENT TYPE:   LOCATION:                                 FACILITY:   PHYSICIAN:  Janine Limbo, M.D.    DATE OF BIRTH:   DATE OF ADMISSION:  02/18/2005  DATE OF DISCHARGE:                                HISTORY & PHYSICAL   HISTORY OF PRESENT ILLNESS:  [name] is a 50 year old female para 0-0-1-0  who presents for a laparoscopy assisted vaginal hysterectomy and a right  laparoscopic salpingo-oophorectomy.  The patient has been followed at the  [facility] and Gynecology division of [facility].  The patient complains of menorrhagia.  She has been treated in  the past with Coumadin but she continues to have bleeding in spite of no  longer taking her medication.  An ultrasound was performed and it showed an  8.5 x 5.4 cm uterus with multiple fibroids (largest fibroid measuring 1.7 cm  in size).  The patient's most recent Pap smear was within normal limits.  GC  and Chlamydia tests of the cervix were negative.  An endometrial biopsy was  performed and it showed benign secretory endometrium.  The patient has had a  D&C in the past.  She has also had a laparoscopic cholecystectomy in the  past.   OBSTETRICAL HISTORY:  The patient had one first trimester miscarriage.   PAST MEDICAL HISTORY:  The patient has a past history of hepatitis C and she  was treated in 2006 with a study protocol.  She is no longer on those  medications.  In June of 2006 the patient had a deep venous thrombosis that  was believed to be secondary to hormone replacement therapy.  She was  treated with Coumadin, but is no longer being treated.  The patient had a  laparoscopic cholecystectomy in 2004.  The patient has sleep apnea and  narcolepsy.  She broke both of her arms in 1999 and is currently on  disability.   CURRENT MEDICATIONS:  1.  Potassium 20 mEq each day.  2.  Iron 325 mg b.i.d.  3.  B complex each day.  4.  Valtrex 500 mg each day.  5.  Gabapentin or Neurontin 300 mg each day.  6.  Provigil 200 mg each day.  7.  Citalopram 20 mg each day.  8.  Detrol LA 4 mg each day.  9.  Advair 250 mg each day.  10. Allegra D as needed.  11. Nasacort as needed.   ALLERGIES:  The patient reports being allergic to SULFA medication and  CODEINE medication.   SOCIAL HISTORY:  The patient denies cigarette use, alcohol use, and  recreational drug use.  She is single and she currently is on disability.  She previously worked as a Engineer, drilling.   REVIEW OF SYSTEMS:  The patient had her tonsils removed as a child.  She had  chicken pox and measles as a child.  She has sleep apnea and she currently  uses  a CPAP machine.   FAMILY HISTORY:  The patient's maternal grandmother had colon and breast  cancer.   PHYSICAL EXAMINATION:  VITAL SIGNS:  Weight 176 pounds, height 5 feet 3  inches.  HEENT:  Within normal limits.  CHEST:  Clear.  HEART:  Regular rate and rhythm.  BREASTS:  Without masses.  ABDOMEN:  Nontender.  EXTREMITIES:  Grossly normal.  NEUROLOGIC:  Limited motion in the upper extremities and weakness in the  upper extremities.  PELVIC:  External genitalia is normal.  The vagina is normal.  Cervix is  nontender.  Uterus is upper limits normal size and there is a fibroid  palpable on the anterior uterus.  Adnexa:  No masses appreciated and  rectovaginal examination confirms.   ASSESSMENT:  1.  Fibroid uterus.  2.  Menometrorrhagia.  3.  Dysmenorrhea.  4.  Sleep apnea.  5.  Hepatitis C.  6.  Narcolepsy.  7.  History of a deep venous thrombosis.   PLAN:  The patient will undergo a laparoscopy assisted vaginal hysterectomy.  She has also elected to undergo a right salpingo-oophorectomy.  She  understands the indications for her procedure and she accepts the risks of,  but not limited to, anesthetic complications, bleeding,  infections, and  possible damage to the surrounding organs.  The patient understands that if  there appears to be any pathology on the left ovary then we will remove the  left ovary and keep the right ovary.  She understands that if for some  reason both ovaries are removed then she will be at increased risk for  osteoporosis because hormone replacement therapy would not be indicated for  her given her history of a deep venous thrombosis on hormone therapy.      Janine Limbo, M.D.  Electronically Signed     AVS/MEDQ  D:  02/17/2005  T:  02/17/2005  Job:  [id number]   cc:   Sharon Mt?, M.D.  [facility]   Samul Dada, M.D.  Fax: [phone]   Loreen Freud, M.D.  [place] [place]  [place] [address]

## 2010-06-20 NOTE — Procedures (Signed)
NAME:  Molly Klein, Molly Klein NO.:  [id number]   MEDICAL RECORD NO.:  [id number]          PATIENT TYPE:  OUT   LOCATION:  SLEEP CENTER                 FACILITY:  [facility]   PHYSICIAN:  Marcelyn Bruins, M.D. [facility] DATE OF BIRTH:  Oct 28, 1960   DATE OF STUDY:  05/06/2005                              NOCTURNAL POLYSOMNOGRAM   REFERRING PHYSICIAN:  Loreen Freud, M.D.   INDICATION FOR STUDY:  Hypersomnia with sleep apnea.   EPWORTH SLEEPINESS SCORE:  23.   SLEEP ARCHITECTURE:  The patient had a total sleep time of 313 minutes with  significantly decreased slow-wave sleep and also REM.  Sleep onset latency  was normal and REM onset was normal as well.  Sleep efficiency was decreased  at 83%.   RESPIRATORY DATA:  The patient underwent a split night study where she was  found to have 130 obstructive events in the first 139 minutes of sleep.  This gave her a respiratory disturbance index of 56 events per hour.  The  events did not appear to be positional, but there was loud snoring noted  throughout.  By protocol the patient was placed on a large Respironics  Comfort Light II nasal mask, which is different from the one that she  normally uses at home.  The patient actually liked the fit of this mask  better.  CPAP titration was initiated and at a final pressure of 8 cmH2O,  she had excellent control of both her obstructive events and snoring.   OXYGEN DATA:  There was O2 desaturation as low as 91% with her obstructive  events.   CARDIAC DATA:  Rare PVC was noted.   MOVEMENT-PARASOMNIA:  The patient was found to have 494 leg jerks with three  per hour resulting in arousal or awakening.  However, as the CPAP pressure  was titrated upward, the leg jerks significantly decreased in severity.  This may imply that her leg movements are related to her sleep-disordered  breathing; however, clinical correlation is suggested if she continues to  have symptoms despite adequate treatment with  CPAP.           ______________________________  Marcelyn Bruins, M.D. [facility]  Diplomate, American Board of Sleep  Medicine     KC/MEDQ  D:  05/28/2005 16:14:42  T:  05/29/2005 11:13:14  Job:  [id number]

## 2010-06-20 NOTE — Discharge Summary (Signed)
NAME:  Molly Klein, Molly Klein NO.:  [id number]   MEDICAL RECORD NO.:  [id number]          PATIENT TYPE:  INP   LOCATION:  9373                          [facility]ILITY:  [facility]   PHYSICIAN:  Janine Limbo, M.D.DATE OF BIRTH:  [birth year]   DATE OF ADMISSION:  02/18/2005  DATE OF DISCHARGE:  02/19/2005                                 DISCHARGE SUMMARY   ADMISSION DIAGNOSES:  1.  Fibroid uterus.  2.  Anemia (hemoglobin 11.7).  3.  Menometrorrhagia.  4.  Dysmenorrhea.  5.  Hepatitis C.  6.  History of a deep venous thrombosis.  7.  Sleep apnea.   DISCHARGE DIAGNOSES:  1.  Fibroid uterus.  2.  Anemia (hemoglobin 11.7).  3.  Menometrorrhagia.  4.  Dysmenorrhea.  5.  Hepatitis C.  6.  History of a deep venous thrombosis.  7.  Sleep apnea.  8.  Pelvic adhesions.   PROCEDURES THIS ADMISSION:  On February 18, 2005:  1.  Laparoscopy-assisted vaginal hysterectomy.  2.  Laparoscopic right salpingo-oophorectomy.  3.  Laparoscopic lysis of adhesions.  4.  Cystoscopy   HISTORY OF PRESENT ILLNESS:  [name] is a 50 year old female para 0-0-1-0  who presents with the above-mentioned diagnoses. She has not responded to  conservative measures. The patient has a history of a deep venous thrombosis  associated with hormone replacement therapy. She originally desired to have  both ovaries removed at the time of this surgery but understands that we  would not recommend hormone replacement therapy. For that reason, she has  asked that we only remove the right ovary. Please see her dictated history  and physical exam for details.   ADMISSION PHYSICAL EXAMINATION:  The patient was noted to have a 10- to 12-  week-size fibroid uterus on exam.   HOSPITAL COURSE:  On the day of admission the patient was taken to the  operating room where a laparoscopy-assisted vaginal hysterectomy was  performed. Preoperatively, the patient did receive 5000 units of  subcutaneous heparin as well as  pneumatic compression stockings. Operative  findings included a 10- to 12-week-size multifibroid uterus. The patient was  also noted to have adhesions between the ovaries, fallopian tubes, the  bowel, and the pelvic sidewall. For that reason, a laparoscopic lysis of  adhesions was performed. We were able to safely remove the right fallopian  tube and the right ovary. The patient did have some bleeding on the right  pelvic sidewall and hemostasis was adequately achieved in the operating  room. We performed a cystoscopy so that we could document that there was no  damage to the bladder or to the ureters. The bladder was noted be normal and  there was noted to be passage of blue dye through both ureteral orifices.  The patient tolerated her procedure quite well. Because of the patient's  history of sleep apnea (and because the patient uses CPAP for sleeping), the  patient was taken to the adult intensive care unit for postoperative  monitoring. Her vital signs remained stable and she did quite well  postoperatively. She did  have an episode of nausea that was controlled using  Phenergan. The patient's postoperative hemoglobin was 10.5 (preoperative  hemoglobin 11.7). The patient's postoperative SGOT was 49 (preoperative SGOT  was 64). Her postoperative SGPT was 58 (preoperative SGPT was 75). The  patient's postoperative potassium was 3.9 (preoperative potassium 3.3). By  the afternoon of postoperative day #1 the patient was tolerating a regular  diet. She had adequate pain control using p.o. pain medications. The patient  was given 5000 units of subcutaneous heparin postoperatively every 8 hours.  I consulted with the patient's hematologist and he recommended that the  patient be discharged on Lovenox as well as beginning Coumadin. The patient  was felt to be ready for discharge on the afternoon of postoperative day #1.  She was discharged to home in stable condition.   DISCHARGE  MEDICATIONS:  1.  Dilaudid 2-4 mg every 4 hours as needed for pain.  2.  Lovenox 30 mg subcutaneously every 12 hours.  3.  Coumadin 7.5 mg each day.  4.  The patient will continue her preoperative medications.   FOLLOW-UP INSTRUCTIONS:  The patient was instructed to return to see Dr.  Stefano Gaul in 6 weeks for follow-up examination. She was told to see Dr.  Arline Asp in the next 4 days so that he came properly monitor her coagulation  profile. She will refrain from driving for 2 weeks, heavy lifting for 4  weeks, and intercourse for 6 weeks. She will call for questions or concerns.  She was given a copy of the postoperative instruction sheet as prepared by  the [facility] and Gynecology division of [facility]. She was told to call with any questions or concerns.  She was told to call for any bleeding episodes.      Janine Limbo, M.D.  Electronically Signed     AVS/MEDQ  D:  02/20/2005  T:  02/20/2005  Job:  [id number]   cc:   Samul Dada, M.D.  Fax: [phone]   Loreen Freud, M.D.  [facility] [place]  [place] [address]   Alba Destine, M.D.  [facility]

## 2010-07-08 ENCOUNTER — Encounter: Payer: Self-pay | Admitting: Family Medicine

## 2010-07-09 ENCOUNTER — Encounter: Payer: Self-pay | Admitting: Family Medicine

## 2010-07-09 ENCOUNTER — Ambulatory Visit (INDEPENDENT_AMBULATORY_CARE_PROVIDER_SITE_OTHER): Payer: Medicare Other | Admitting: Family Medicine

## 2010-07-09 VITALS — BP 120/80 | Temp 98.4°F | Wt 204.4 lb

## 2010-07-09 DIAGNOSIS — S81009A Unspecified open wound, unspecified knee, initial encounter: Secondary | ICD-10-CM

## 2010-07-09 DIAGNOSIS — S91009A Unspecified open wound, unspecified ankle, initial encounter: Secondary | ICD-10-CM

## 2010-07-09 DIAGNOSIS — W540XXA Bitten by dog, initial encounter: Secondary | ICD-10-CM

## 2010-07-09 DIAGNOSIS — IMO0002 Reserved for concepts with insufficient information to code with codable children: Secondary | ICD-10-CM | POA: Insufficient documentation

## 2010-07-09 MED ORDER — AMOXICILLIN-POT CLAVULANATE 875-125 MG PO TABS: 1.0000 | ORAL_TABLET | Freq: Two times a day (BID) | ORAL | Status: AC

## 2010-07-09 NOTE — Progress Notes (Signed)
  Subjective:    Patient ID: Molly Klein, female    DOB: 1961-01-09, 50 y.o.   MRN: [id number]  HPI Dog bite- occurred 2 days ago, bit by friends Shitzu.  L lower leg w/ bruise, puncture wound.  Denies fevers.     Review of Systems For ROS see HPI     Objective:   Physical Exam  Constitutional: She appears well-developed and well-nourished. No distress.  Skin:       L lateral lower leg w/ small puncture wound surrounded by bruise.  No fluctuance or drainage noted.          Assessment & Plan:

## 2010-07-09 NOTE — Assessment & Plan Note (Signed)
Will start augmentin to treat animal bite and prevent infection.  Pt to ice leg to improve bruising and swelling.  Reviewed supportive care and red flags that should prompt return.  Pt expressed understanding and is in agreement w/ plan.

## 2010-07-09 NOTE — Patient Instructions (Signed)
Take the Augmentin for the infected bite.  Take w/ food to avoid upset stomach Ice the leg Ibuprofen as needed for pain relief Call with any questions or concerns Hang in there!

## 2010-08-25 ENCOUNTER — Other Ambulatory Visit: Payer: Self-pay | Admitting: Family Medicine

## 2010-08-26 ENCOUNTER — Ambulatory Visit (INDEPENDENT_AMBULATORY_CARE_PROVIDER_SITE_OTHER): Payer: Medicare Other | Admitting: Internal Medicine

## 2010-08-26 ENCOUNTER — Encounter: Payer: Self-pay | Admitting: Internal Medicine

## 2010-08-26 VITALS — BP 134/88 | HR 85 | Temp 98.2°F | Wt 202.2 lb

## 2010-08-26 DIAGNOSIS — J029 Acute pharyngitis, unspecified: Secondary | ICD-10-CM

## 2010-08-26 DIAGNOSIS — R591 Generalized enlarged lymph nodes: Secondary | ICD-10-CM

## 2010-08-26 DIAGNOSIS — H9209 Otalgia, unspecified ear: Secondary | ICD-10-CM

## 2010-08-26 DIAGNOSIS — R599 Enlarged lymph nodes, unspecified: Secondary | ICD-10-CM

## 2010-08-26 MED ORDER — AMOXICILLIN 500 MG PO CAPS: 1000.0000 mg | ORAL_CAPSULE | Freq: Two times a day (BID) | ORAL | Status: AC

## 2010-08-26 MED ORDER — ANTIPYRINE-BENZOCAINE 5.4-1.4 % OT SOLN: 3.0000 [drp] | Freq: Three times a day (TID) | OTIC | Status: AC | PRN

## 2010-08-26 NOTE — Progress Notes (Signed)
  Subjective:    Patient ID: Molly Klein, female    DOB: 02-Nov-1960, 50 y.o.   MRN: [id number]  HPI 2 weeks history of on and off  left > right ear discharge and discomfort. On further questioning, she only sees a clear fluid whenever she inserts a Q-tip in the ear. No actual purulent discharge . Some sore throat.  Past Medical History  Diagnosis Date  . Asthma   . GERD (gastroesophageal reflux disease)   . Hepatitis C   . DVT (deep venous thrombosis)    Past Surgical History  Procedure Date  . Cholecystectomy   . Partial hysterectomy     ?ooporectomy      Review of Systems No runny nose, cough, chest congestion, mild sinus congestion. Subjective fever?    Objective:   Physical Exam  Constitutional: She appears well-developed.  HENT:  Head: Normocephalic and atraumatic.       Ear canals and tympanic membranes normal. No wax, discharge, trago sign negative. Palpation of the gum showed no fluctuance or tenderness but  is slightly asymmetric: More prominent at  The right inner lower gum  Neck:       Tender lymphadenopathies, around 1.5 cm at the jaw angle bilaterally.          Assessment & Plan:  1. Otalgia of unclear etiology, no evidence of infection ----> recommend observation for now Addendum: request something for pain, Rx auralgan 2. Asymmetric gum----> strongly recommend to see her dentist 3. Tender lymphadenopathies bilaterally ----> check a strep A. amoxicillin empirically for one week Followup with PCP in 2 weeks for reassessment. May need ENT referral

## 2010-08-26 NOTE — Patient Instructions (Signed)
Please see your dentist, the gum on the right side is abnormal. Come back and see your primary doctor in 2 weeks for reassessment of the ear pain and neck glands

## 2010-11-10 LAB — DIFFERENTIAL
Basophils Absolute: 0
Eosinophils Absolute: 0.1 — ABNORMAL LOW
Eosinophils Relative: 2
Lymphocytes Relative: 37
Neutrophils Relative %: 53

## 2010-11-10 LAB — CBC
HCT: 42.7
Hemoglobin: 15.2 — ABNORMAL HIGH
MCHC: 35.6
MCV: 91.2
RDW: 14.8

## 2010-11-10 LAB — PROTIME-INR
INR: 0.9
Prothrombin Time: 12.5

## 2010-11-22 ENCOUNTER — Emergency Department (HOSPITAL_COMMUNITY)
Admission: EM | Admit: 2010-11-22 | Discharge: 2010-11-22 | Disposition: A | Discharge status: Home / Self Care | Payer: Medicare Other | Attending: Emergency Medicine | Admitting: Emergency Medicine

## 2010-11-22 DIAGNOSIS — M7989 Other specified soft tissue disorders: Secondary | ICD-10-CM | POA: Insufficient documentation

## 2010-11-22 DIAGNOSIS — M79609 Pain in unspecified limb: Secondary | ICD-10-CM

## 2010-11-22 DIAGNOSIS — Z8619 Personal history of other infectious and parasitic diseases: Secondary | ICD-10-CM | POA: Insufficient documentation

## 2010-11-24 ENCOUNTER — Telehealth: Payer: Self-pay | Admitting: *Deleted

## 2010-11-24 NOTE — Telephone Encounter (Signed)
Triage Call Report Triage Record Num: [id number] Operator: Tomasita Crumble Patient Name: Molly Klein Call Date & Time: 11/22/2010 11:55:44AM Patient Phone: [phone] PCP: Lelon Perla Patient Gender: Female PCP Fax : [phone] Patient DOB: Aug 21, 1960 Practice Name: [facility] Reason for Call: Onset right knee pain and swelling on 10/19. Pain 8/10. Swelling from ankle to knee. Worse after walking dog and bending down; painful. Hx of surgery ~ 22010. Numbness reported and right leg cool to touch compared to left. Advised ED per Knee Non-Injury protocol. Pt. states she is at [facility] ED at conclusion of call. Protocol(s) Used: Knee Non-Injury Recommended Outcome per Protocol: See ED Immediately Reason for Outcome: New onset of severe pain AND change in sensation (numb, tingling, or no feeling), change in color (pale or blue), feels cool to touch compared to other extremity. Care Advice: ~ Protect the patient from falling or other harm. ~ Another adult should drive. ~ Do not give the patient anything to eat or drink. ~ Avoid moving affected part. Protect from any injury. ~ DO NOT attempt to place any weight on the affected extremity until evaluated by provider. Write down provider's name. List or place the following in a bag for transport with the patient: current prescription and/or nonprescription medications; alternative treatments, therapies and medications; and street drugs. ~

## 2010-11-24 NOTE — Telephone Encounter (Signed)
Left message to call office

## 2010-11-26 NOTE — Telephone Encounter (Signed)
Does she need a referral?

## 2010-11-26 NOTE — Telephone Encounter (Signed)
Pt indicated that she had ortho from knee surgery and will call them to see if they can see her. Pt is unsure if she needs a referral.

## 2010-11-26 NOTE — Telephone Encounter (Signed)
Left message to call office

## 2010-11-26 NOTE — Telephone Encounter (Signed)
Pt indicated that she went to ED for evaluation. Pt notes that they informed her to F/U with ortho.

## 2010-12-30 ENCOUNTER — Other Ambulatory Visit: Payer: Self-pay | Admitting: Obstetrics and Gynecology

## 2010-12-30 DIAGNOSIS — N6452 Nipple discharge: Secondary | ICD-10-CM

## 2011-01-05 ENCOUNTER — Encounter: Payer: Self-pay | Admitting: Family Medicine

## 2011-01-06 ENCOUNTER — Ambulatory Visit: Payer: Medicare Other | Admitting: Family Medicine

## 2011-01-07 ENCOUNTER — Ambulatory Visit
Admission: RE | Admit: 2011-01-07 | Discharge: 2011-01-07 | Disposition: A | Discharge status: Home / Self Care | Payer: Medicare Other | Source: Ambulatory Visit | Attending: Obstetrics and Gynecology | Admitting: Obstetrics and Gynecology

## 2011-01-07 ENCOUNTER — Other Ambulatory Visit: Payer: Self-pay | Admitting: Obstetrics and Gynecology

## 2011-01-07 DIAGNOSIS — N6452 Nipple discharge: Secondary | ICD-10-CM

## 2011-01-15 ENCOUNTER — Ambulatory Visit
Admission: RE | Admit: 2011-01-15 | Discharge: 2011-01-15 | Disposition: A | Discharge status: Home / Self Care | Payer: Medicare Other | Source: Ambulatory Visit | Attending: Obstetrics and Gynecology | Admitting: Obstetrics and Gynecology

## 2011-01-15 ENCOUNTER — Other Ambulatory Visit: Payer: Self-pay | Admitting: Obstetrics and Gynecology

## 2011-01-15 DIAGNOSIS — N6452 Nipple discharge: Secondary | ICD-10-CM

## 2011-01-23 ENCOUNTER — Ambulatory Visit
Admission: RE | Admit: 2011-01-23 | Discharge: 2011-01-23 | Disposition: A | Discharge status: Home / Self Care | Payer: Medicare Other | Source: Ambulatory Visit | Attending: Obstetrics and Gynecology | Admitting: Obstetrics and Gynecology

## 2011-01-23 DIAGNOSIS — N6452 Nipple discharge: Secondary | ICD-10-CM

## 2011-01-23 MED ORDER — GADOBENATE DIMEGLUMINE 529 MG/ML IV SOLN
20.0000 mL | Freq: Once | INTRAVENOUS | Status: AC | PRN
  Administered 2011-01-23: 20 mL via INTRAVENOUS

## 2011-01-30 ENCOUNTER — Encounter (INDEPENDENT_AMBULATORY_CARE_PROVIDER_SITE_OTHER): Payer: Self-pay | Admitting: Surgery

## 2011-02-04 ENCOUNTER — Encounter (INDEPENDENT_AMBULATORY_CARE_PROVIDER_SITE_OTHER): Payer: Self-pay | Admitting: Surgery

## 2011-02-04 ENCOUNTER — Ambulatory Visit (INDEPENDENT_AMBULATORY_CARE_PROVIDER_SITE_OTHER): Payer: Medicare Other | Admitting: Surgery

## 2011-02-04 ENCOUNTER — Encounter (HOSPITAL_BASED_OUTPATIENT_CLINIC_OR_DEPARTMENT_OTHER): Payer: Self-pay | Admitting: *Deleted

## 2011-02-04 VITALS — BP 132/88 | HR 64 | Temp 98.0°F | Resp 16 | Ht 63.0 in | Wt 222.2 lb

## 2011-02-04 DIAGNOSIS — N6459 Other signs and symptoms in breast: Secondary | ICD-10-CM

## 2011-02-04 DIAGNOSIS — N6452 Nipple discharge: Secondary | ICD-10-CM

## 2011-02-04 NOTE — Progress Notes (Signed)
Chief Complaint  Patient presents with  . Breast Discharge    new pt- eval of rt nipple discharge - refferral by Dr. Marline Backbone   HISTORY: The patient is a 51 year old black female who presents with a three month history of right nipple discharge. Patient reports a rust colored discharge which occurs on a daily basis. She has had an extensive evaluation including bilateral mammogram which showed no evidence of malignancy. Two attempts were made at ductogram without success. Right breast ultrasound showed dilated ductules beneath the right nipple without mass or suspicious finding. Patient underwent MRI of the breast which was negative for malignancy. Patient is now referred for evaluation of her right nipple discharge with abnormal ultrasound.  Patient has had no prior history of breast disease. She's had no prior breast biopsies. She does have a maternal grandmother with breast cancer who underwent mastectomy.  Past Medical History  Diagnosis Date  . Asthma   . GERD (gastroesophageal reflux disease)   . Hepatitis C   . DVT (deep venous thrombosis)   . Nipple discharge     right breast     Current Outpatient Prescriptions  Medication Sig Dispense Refill  . albuterol (PROVENTIL HFA) 108 (90 BASE) MCG/ACT inhaler Inhale 2 puffs into the lungs 4 (four) times daily.        Marland Kitchen albuterol (PROVENTIL) (2.5 MG/3ML) 0.083% nebulizer solution Take 2.5 mg by nebulization as directed.        . gabapentin (NEURONTIN) 300 MG capsule Take 300 mg by mouth daily.        Marland Kitchen lisinopril (PRINIVIL,ZESTRIL) 10 MG tablet Take 10 mg by mouth daily.        . modafinil (PROVIGIL) 200 MG tablet Take 200 mg by mouth daily.        Marland Kitchen Respiratory Therapy Supplies (NEBULIZER COMPRESSOR) KIT by Does not apply route.        Marland Kitchen spironolactone (ALDACTONE) 25 MG tablet Take 25 mg by mouth daily.           Allergies  Allergen Reactions  . Codeine   . Interferon Beta-1a   . Sulfonamide Derivatives      Family  History  Problem Relation Age of Onset  . Alcohol abuse    . Breast cancer    . Coronary artery disease    . Diabetes    . Cancer      BREAST  . Cancer Sister     pt unaware of what kind  . Cancer Maternal Grandmother     breast     History   Social History  . Marital Status: Divorced    Spouse Name: N/A    Number of Children: N/A  . Years of Education: N/A   Social History Main Topics  . Smoking status: Former Smoker    Quit date: 02/03/1975  . Smokeless tobacco: None  . Alcohol Use: No  . Drug Use: No  . Sexually Active:    Other Topics Concern  . None   Social History Narrative  . None     REVIEW OF SYSTEMS - PERTINENT POSITIVES ONLY: Right nipple discharge as noted above. No masses. Mild diffuse tenderness.   EXAM: Filed Vitals:   02/04/11 0849  BP: 132/88  Pulse: 64  Temp: 98 F (36.7 C)  Resp: 16    HEENT: normocephalic; pupils equal and reactive; sclerae clear; dentition good; mucous membranes moist NECK:  No nodules; symmetric on extension; no palpable anterior or posterior cervical lymphadenopathy; no  supraclavicular masses; no tenderness BREAST: Right breast with an extranumery nipple. Normal nipple areolar complex. I am able to express a small amount of thin rust-colored fluid from the central part of the right nipple. No palpable masses.  Breast parenchyma is diffusely nodular without discrete or dominant mass. Left breast shows a normal nipple areolar complex. Again breast parenchyma is diffusely nodular with mild tenderness. No discrete or dominant masses. No nipple discharge on the left. CHEST: clear to auscultation bilaterally without rales, rhonchi, or wheezes CARDIAC: regular rate and rhythm without significant murmur; peripheral pulses are full EXT:  non-tender without edema; no deformity NEURO: no gross focal deficits; no sign of tremor   LABORATORY RESULTS: See E-Chart for most recent results   RADIOLOGY RESULTS: See E-Chart or  I-Site for most recent results   IMPRESSION: Right nipple discharge with abnormal ultrasound examination  PLAN: The patient and I discussed the above findings. I have recommended excisional biopsy of the dilated ducts beneath the central portion of the right nipple. This will be done as an outpatient procedure. We will review the pathology. I suspect there will be benign findings.  The risks and benefits of the procedure have been discussed at length with the patient.  The patient understands the proposed procedure, potential alternative treatments, and the course of recovery to be expected.  All of the patient's questions have been answered at this time.  The patient wishes to proceed with surgery and will schedule a date for their procedure through our office staff.  Velora Heckler, MD, FACS General & Endocrine Surgery [facility]   Visit Diagnoses: 1. Nipple discharge in female, right     Primary Care Physician: Loreen Freud, DO, DO  GYN:  Dr. Leonard Schwartz

## 2011-02-04 NOTE — Progress Notes (Signed)
States off lisinopril

## 2011-02-04 NOTE — Progress Notes (Signed)
To bring cpap dos Bring all meds Has asthma-controlled-asked pt to do neb tx prior to coming [employer] 1230

## 2011-02-06 ENCOUNTER — Other Ambulatory Visit (INDEPENDENT_AMBULATORY_CARE_PROVIDER_SITE_OTHER): Payer: Self-pay | Admitting: Surgery

## 2011-02-06 ENCOUNTER — Encounter (HOSPITAL_BASED_OUTPATIENT_CLINIC_OR_DEPARTMENT_OTHER): Payer: Self-pay | Admitting: Anesthesiology

## 2011-02-06 ENCOUNTER — Ambulatory Visit (HOSPITAL_BASED_OUTPATIENT_CLINIC_OR_DEPARTMENT_OTHER): Payer: Medicare Other | Admitting: Anesthesiology

## 2011-02-06 ENCOUNTER — Encounter (HOSPITAL_BASED_OUTPATIENT_CLINIC_OR_DEPARTMENT_OTHER)
Admission: RE | Disposition: A | Discharge status: Home / Self Care | Payer: Self-pay | Source: Ambulatory Visit | Attending: Surgery

## 2011-02-06 ENCOUNTER — Encounter (HOSPITAL_BASED_OUTPATIENT_CLINIC_OR_DEPARTMENT_OTHER): Payer: Self-pay | Admitting: *Deleted

## 2011-02-06 ENCOUNTER — Ambulatory Visit (HOSPITAL_BASED_OUTPATIENT_CLINIC_OR_DEPARTMENT_OTHER)
Admission: RE | Admit: 2011-02-06 | Discharge: 2011-02-06 | Disposition: A | Discharge status: Home / Self Care | Payer: Medicare Other | Source: Ambulatory Visit | Attending: Surgery | Admitting: Surgery

## 2011-02-06 ENCOUNTER — Encounter (HOSPITAL_BASED_OUTPATIENT_CLINIC_OR_DEPARTMENT_OTHER): Payer: Self-pay | Admitting: Certified Registered Nurse Anesthetist

## 2011-02-06 DIAGNOSIS — N6459 Other signs and symptoms in breast: Secondary | ICD-10-CM

## 2011-02-06 DIAGNOSIS — D249 Benign neoplasm of unspecified breast: Secondary | ICD-10-CM | POA: Insufficient documentation

## 2011-02-06 DIAGNOSIS — K219 Gastro-esophageal reflux disease without esophagitis: Secondary | ICD-10-CM | POA: Insufficient documentation

## 2011-02-06 DIAGNOSIS — Z86718 Personal history of other venous thrombosis and embolism: Secondary | ICD-10-CM | POA: Insufficient documentation

## 2011-02-06 DIAGNOSIS — N6452 Nipple discharge: Secondary | ICD-10-CM

## 2011-02-06 DIAGNOSIS — J45909 Unspecified asthma, uncomplicated: Secondary | ICD-10-CM | POA: Insufficient documentation

## 2011-02-06 DIAGNOSIS — B1920 Unspecified viral hepatitis C without hepatic coma: Secondary | ICD-10-CM | POA: Insufficient documentation

## 2011-02-06 HISTORY — DX: Myoneural disorder, unspecified: G70.9

## 2011-02-06 HISTORY — PX: BREAST BIOPSY: SHX20

## 2011-02-06 LAB — POCT I-STAT, CHEM 8
BUN: 8 mg/dL (ref 6–23)
Calcium, Ion: 1.14 mmol/L (ref 1.12–1.32)
Chloride: 104 mEq/L (ref 96–112)
Glucose, Bld: 80 mg/dL (ref 70–99)
HCT: 46 % (ref 36.0–46.0)
Potassium: 4.2 mEq/L (ref 3.5–5.1)

## 2011-02-06 SURGERY — BREAST BIOPSY
Anesthesia: General | Site: Breast | Laterality: Right | Wound class: Clean

## 2011-02-06 MED ORDER — HYDROCODONE-ACETAMINOPHEN 5-325 MG PO TABS
1.0000 | ORAL_TABLET | Freq: Once | ORAL | Status: AC | PRN
  Administered 2011-02-06: 1 via ORAL

## 2011-02-06 MED ORDER — FENTANYL CITRATE 0.05 MG/ML IJ SOLN
INTRAMUSCULAR | Status: DC | PRN
  Administered 2011-02-06: 25 ug via INTRAVENOUS
  Administered 2011-02-06 (×2): 50 ug via INTRAVENOUS

## 2011-02-06 MED ORDER — BUPIVACAINE HCL (PF) 0.5 % IJ SOLN
INTRAMUSCULAR | Status: DC | PRN
  Administered 2011-02-06: 10 mL

## 2011-02-06 MED ORDER — ONDANSETRON HCL 4 MG/2ML IJ SOLN
INTRAMUSCULAR | Status: DC | PRN
  Administered 2011-02-06: 4 mg via INTRAVENOUS

## 2011-02-06 MED ORDER — FENTANYL CITRATE 0.05 MG/ML IJ SOLN: 50.0000 ug | INTRAMUSCULAR | Status: DC | PRN

## 2011-02-06 MED ORDER — METOCLOPRAMIDE HCL 5 MG/ML IJ SOLN: 10.0000 mg | Freq: Once | INTRAMUSCULAR | Status: DC | PRN

## 2011-02-06 MED ORDER — LIDOCAINE HCL (CARDIAC) 20 MG/ML IV SOLN
INTRAVENOUS | Status: DC | PRN
  Administered 2011-02-06: 60 mg via INTRAVENOUS

## 2011-02-06 MED ORDER — MORPHINE SULFATE 2 MG/ML IJ SOLN: 0.0500 mg/kg | INTRAMUSCULAR | Status: DC | PRN

## 2011-02-06 MED ORDER — PROPOFOL 10 MG/ML IV EMUL
INTRAVENOUS | Status: DC | PRN
  Administered 2011-02-06: 200 mg via INTRAVENOUS

## 2011-02-06 MED ORDER — MIDAZOLAM HCL 5 MG/5ML IJ SOLN
INTRAMUSCULAR | Status: DC | PRN
  Administered 2011-02-06: 2 mg via INTRAVENOUS

## 2011-02-06 MED ORDER — METOCLOPRAMIDE HCL 5 MG/ML IJ SOLN
INTRAMUSCULAR | Status: DC | PRN
  Administered 2011-02-06: 10 mg via INTRAVENOUS

## 2011-02-06 MED ORDER — MIDAZOLAM HCL 2 MG/2ML IJ SOLN: 0.5000 mg | INTRAMUSCULAR | Status: DC | PRN

## 2011-02-06 MED ORDER — FENTANYL CITRATE 0.05 MG/ML IJ SOLN
25.0000 ug | INTRAMUSCULAR | Status: DC | PRN
  Administered 2011-02-06: 25 ug via INTRAVENOUS

## 2011-02-06 MED ORDER — CHLORHEXIDINE GLUCONATE 4 % EX LIQD: 1.0000 "application " | Freq: Once | CUTANEOUS | Status: DC

## 2011-02-06 MED ORDER — LACTATED RINGERS IV SOLN
INTRAVENOUS | Status: DC
  Administered 2011-02-06: 14:00:00 via INTRAVENOUS

## 2011-02-06 MED ORDER — DEXAMETHASONE SODIUM PHOSPHATE 4 MG/ML IJ SOLN
INTRAMUSCULAR | Status: DC | PRN
  Administered 2011-02-06: 1 mg via INTRAVENOUS

## 2011-02-06 MED ORDER — HYDROCODONE-ACETAMINOPHEN 5-325 MG PO TABS: 1.0000 | ORAL_TABLET | ORAL | Status: AC | PRN

## 2011-02-06 MED ORDER — CEFAZOLIN SODIUM-DEXTROSE 2-3 GM-% IV SOLR: 2.0000 g | INTRAVENOUS | Status: DC

## 2011-02-06 SURGICAL SUPPLY — 35 items
APL SKNCLS STERI-STRIP NONHPOA (GAUZE/BANDAGES/DRESSINGS) ×1
BENZOIN TINCTURE PRP APPL 2/3 (GAUZE/BANDAGES/DRESSINGS) ×2 IMPLANT
BLADE SURG 15 STRL LF DISP TIS (BLADE) ×1 IMPLANT
BLADE SURG 15 STRL SS (BLADE) ×2
CANISTER SUCTION 1200CC (MISCELLANEOUS) IMPLANT
CHLORAPREP W/TINT 26ML (MISCELLANEOUS) ×2 IMPLANT
CLIP TI WIDE RED SMALL 6 (CLIP) IMPLANT
CLOTH BEACON ORANGE TIMEOUT ST (SAFETY) ×2 IMPLANT
COVER MAYO STAND STRL (DRAPES) ×2 IMPLANT
COVER TABLE BACK 60X90 (DRAPES) ×2 IMPLANT
DECANTER SPIKE VIAL GLASS SM (MISCELLANEOUS) IMPLANT
DEVICE DUBIN W/COMP PLATE 8390 (MISCELLANEOUS) IMPLANT
DRAPE PED LAPAROTOMY (DRAPES) ×2 IMPLANT
DRAPE UTILITY XL STRL (DRAPES) ×2 IMPLANT
ELECT REM PT RETURN 9FT ADLT (ELECTROSURGICAL) ×2
ELECTRODE REM PT RTRN 9FT ADLT (ELECTROSURGICAL) ×1 IMPLANT
GLOVE BIOGEL M STRL SZ7.5 (GLOVE) ×1 IMPLANT
GLOVE SURG ORTHO 8.0 STRL STRW (GLOVE) ×2 IMPLANT
GOWN PREVENTION PLUS XLARGE (GOWN DISPOSABLE) ×2 IMPLANT
GOWN PREVENTION PLUS XXLARGE (GOWN DISPOSABLE) ×3 IMPLANT
NDL HYPO 25X1 1.5 SAFETY (NEEDLE) ×1 IMPLANT
NEEDLE HYPO 25X1 1.5 SAFETY (NEEDLE) ×2 IMPLANT
NS IRRIG 1000ML POUR BTL (IV SOLUTION) ×2 IMPLANT
PACK BASIN DAY SURGERY FS (CUSTOM PROCEDURE TRAY) ×2 IMPLANT
PENCIL BUTTON HOLSTER BLD 10FT (ELECTRODE) ×2 IMPLANT
SLEEVE SCD COMPRESS KNEE MED (MISCELLANEOUS) ×1 IMPLANT
STRIP CLOSURE SKIN 1/2X4 (GAUZE/BANDAGES/DRESSINGS) ×2 IMPLANT
SUT MNCRL AB 4-0 PS2 18 (SUTURE) ×2 IMPLANT
SUT VICRYL 4-0 PS2 18IN ABS (SUTURE) IMPLANT
SYR CONTROL 10ML LL (SYRINGE) ×2 IMPLANT
TOWEL OR 17X24 6PK STRL BLUE (TOWEL DISPOSABLE) ×4 IMPLANT
TOWEL OR NON WOVEN STRL DISP B (DISPOSABLE) ×2 IMPLANT
TUBE CONNECTING 20X1/4 (TUBING) IMPLANT
WATER STERILE IRR 1000ML POUR (IV SOLUTION) ×1 IMPLANT
YANKAUER SUCT BULB TIP NO VENT (SUCTIONS) IMPLANT

## 2011-02-06 NOTE — Anesthesia Procedure Notes (Signed)
Procedure Name: LMA Insertion Date/Time: 02/06/2011 3:11 PM Performed by: Romelia Bromell D Pre-anesthesia Checklist: Patient identified, Emergency Drugs available, Suction available and Patient being monitored Patient Re-evaluated:Patient Re-evaluated prior to inductionOxygen Delivery Method: Circle System Utilized Preoxygenation: Pre-oxygenation with 100% oxygen Intubation Type: IV induction Ventilation: Mask ventilation without difficulty LMA: LMA inserted LMA Size: 4.0 Number of attempts: 1 Placement Confirmation: positive ETCO2 Tube secured with: Tape Dental Injury: Teeth and Oropharynx as per pre-operative assessment

## 2011-02-06 NOTE — Transfer of Care (Signed)
Immediate Anesthesia Transfer of Care Note  Patient: Molly Klein  Procedure(s) Performed:  BREAST BIOPSY - right breast biopsy  Patient Location: PACU  Anesthesia Type: General  Level of Consciousness: awake, alert , oriented and patient cooperative  Airway & Oxygen Therapy: Patient Spontanous Breathing and Patient connected to face mask oxygen  Post-op Assessment: Report given to PACU RN, Post -op Vital signs reviewed and stable and Patient moving all extremities X 4  Post vital signs: Reviewed and stable  Complications: No apparent anesthesia complications

## 2011-02-06 NOTE — Anesthesia Postprocedure Evaluation (Signed)
  Anesthesia Post-op Note  Patient: Molly Klein  Procedure(s) Performed:  BREAST BIOPSY - right breast biopsy  Patient Location: PACU  Anesthesia Type: General  Level of Consciousness: awake  Airway and Oxygen Therapy: Patient Spontanous Breathing and Patient connected to face mask oxygen  Post-op Pain: mild  Post-op Assessment: Post-op Vital signs reviewed, Patient's Cardiovascular Status Stable, Respiratory Function Stable and Patent Airway  Post-op Vital Signs: stable  Complications: No apparent anesthesia complications

## 2011-02-06 NOTE — Anesthesia Preprocedure Evaluation (Signed)
Anesthesia Evaluation  Patient identified by MRN, date of birth, ID band Patient awake    Reviewed: Allergy & Precautions, H&P , NPO status , Patient's Chart, lab work & pertinent test results, reviewed documented beta blocker date and time   Airway Mallampati: II TM Distance: >3 FB Neck ROM: full    Dental   Pulmonary shortness of breath and with exertion, asthma , sleep apnea ,          Cardiovascular hypertension, On Medications neg cardio ROS     Neuro/Psych  Neuromuscular disease Negative Psych ROS   GI/Hepatic negative GI ROS, GERD-  Medicated and Controlled,(+) Hepatitis -, C  Endo/Other  Negative Endocrine ROS  Renal/GU negative Renal ROS  Genitourinary negative   Musculoskeletal   Abdominal   Peds  Hematology negative hematology ROS (+)   Anesthesia Other Findings See surgeon's H&P   Reproductive/Obstetrics negative OB ROS                           Anesthesia Physical Anesthesia Plan  ASA: III  Anesthesia Plan: General   Post-op Pain Management:    Induction: Intravenous  Airway Management Planned: LMA  Additional Equipment:   Intra-op Plan:   Post-operative Plan: Extubation in OR  Informed Consent: I have reviewed the patients History and Physical, chart, labs and discussed the procedure including the risks, benefits and alternatives for the proposed anesthesia with the patient or authorized representative who has indicated his/her understanding and acceptance.     Plan Discussed with: CRNA and Surgeon  Anesthesia Plan Comments:         Anesthesia Quick Evaluation

## 2011-02-06 NOTE — Brief Op Note (Signed)
02/06/2011  3:43 PM  PATIENT:  Molly Klein  51 y.o. female  PRE-OPERATIVE DIAGNOSIS:  right nipple discharge with dilated ducts  POST-OPERATIVE DIAGNOSIS:  Same   PROCEDURE:  Right breast excisional biopsy  SURGEON:   Velora Heckler, MD, FACS  ASSISTANTS: none   ANESTHESIA:   general  EBL:  Total I/O In: 1000 [I.V.:1000] Out: -   BLOOD ADMINISTERED:none  DRAINS: none   LOCAL MEDICATIONS USED:  MARCAINE 10 CC  SPECIMEN:  Excision  DISPOSITION OF SPECIMEN:  PATHOLOGY  COUNTS:  YES  TOURNIQUET:  * No tourniquets in log *  DICTATION: .Other Dictation: Dictation Number [id number]  PLAN OF CARE: Discharge to home after PACU  PATIENT DISPOSITION:  PACU - hemodynamically stable.   Velora Heckler, MD, FACS General & Endocrine Surgery [facility]

## 2011-02-06 NOTE — H&P (View-Only) (Signed)
Chief Complaint  Patient presents with  . Breast Discharge    new pt- eval of rt nipple discharge - refferral by Dr. Vernon Stringer   HISTORY: The patient is a 50-year-old black female who presents with a three month history of right nipple discharge. Patient reports a rust colored discharge which occurs on a daily basis. She has had an extensive evaluation including bilateral mammogram which showed no evidence of malignancy. Two attempts were made at ductogram without success. Right breast ultrasound showed dilated ductules beneath the right nipple without mass or suspicious finding. Patient underwent MRI of the breast which was negative for malignancy. Patient is now referred for evaluation of her right nipple discharge with abnormal ultrasound.  Patient has had no prior history of breast disease. She's had no prior breast biopsies. She does have a maternal grandmother with breast cancer who underwent mastectomy.  Past Medical History  Diagnosis Date  . Asthma   . GERD (gastroesophageal reflux disease)   . Hepatitis C   . DVT (deep venous thrombosis)   . Nipple discharge     right breast     Current Outpatient Prescriptions  Medication Sig Dispense Refill  . albuterol (PROVENTIL HFA) 108 (90 BASE) MCG/ACT inhaler Inhale 2 puffs into the lungs 4 (four) times daily.        . albuterol (PROVENTIL) (2.5 MG/3ML) 0.083% nebulizer solution Take 2.5 mg by nebulization as directed.        . gabapentin (NEURONTIN) 300 MG capsule Take 300 mg by mouth daily.        . lisinopril (PRINIVIL,ZESTRIL) 10 MG tablet Take 10 mg by mouth daily.        . modafinil (PROVIGIL) 200 MG tablet Take 200 mg by mouth daily.        . Respiratory Therapy Supplies (NEBULIZER COMPRESSOR) KIT by Does not apply route.        . spironolactone (ALDACTONE) 25 MG tablet Take 25 mg by mouth daily.           Allergies  Allergen Reactions  . Codeine   . Interferon Beta-1a   . Sulfonamide Derivatives      Family  History  Problem Relation Age of Onset  . Alcohol abuse    . Breast cancer    . Coronary artery disease    . Diabetes    . Cancer      BREAST  . Cancer Sister     pt unaware of what kind  . Cancer Maternal Grandmother     breast     History   Social History  . Marital Status: Divorced    Spouse Name: N/A    Number of Children: N/A  . Years of Education: N/A   Social History Main Topics  . Smoking status: Former Smoker    Quit date: 02/03/1975  . Smokeless tobacco: None  . Alcohol Use: No  . Drug Use: No  . Sexually Active:    Other Topics Concern  . None   Social History Narrative  . None     REVIEW OF SYSTEMS - PERTINENT POSITIVES ONLY: Right nipple discharge as noted above. No masses. Mild diffuse tenderness.   EXAM: Filed Vitals:   02/04/11 0849  BP: 132/88  Pulse: 64  Temp: 98 F (36.7 C)  Resp: 16    HEENT: normocephalic; pupils equal and reactive; sclerae clear; dentition good; mucous membranes moist NECK:  No nodules; symmetric on extension; no palpable anterior or posterior cervical lymphadenopathy; no   supraclavicular masses; no tenderness BREAST: Right breast with an extranumery nipple. Normal nipple areolar complex. I am able to express a small amount of thin rust-colored fluid from the central part of the right nipple. No palpable masses.  Breast parenchyma is diffusely nodular without discrete or dominant mass. Left breast shows a normal nipple areolar complex. Again breast parenchyma is diffusely nodular with mild tenderness. No discrete or dominant masses. No nipple discharge on the left. CHEST: clear to auscultation bilaterally without rales, rhonchi, or wheezes CARDIAC: regular rate and rhythm without significant murmur; peripheral pulses are full EXT:  non-tender without edema; no deformity NEURO: no gross focal deficits; no sign of tremor   LABORATORY RESULTS: See E-Chart for most recent results   RADIOLOGY RESULTS: See E-Chart or  I-Site for most recent results   IMPRESSION: Right nipple discharge with abnormal ultrasound examination  PLAN: The patient and I discussed the above findings. I have recommended excisional biopsy of the dilated ducts beneath the central portion of the right nipple. This will be done as an outpatient procedure. We will review the pathology. I suspect there will be benign findings.  The risks and benefits of the procedure have been discussed at length with the patient.  The patient understands the proposed procedure, potential alternative treatments, and the course of recovery to be expected.  All of the patient's questions have been answered at this time.  The patient wishes to proceed with surgery and will schedule a date for their procedure through our office staff.  Polette Nofsinger M. Shanterica Biehler, MD, FACS General & Endocrine Surgery [facility]   Visit Diagnoses: 1. Nipple discharge in female, right     Primary Care Physician: [name] Lowne, DO, DO  GYN:  Dr. Arthur Vernon Stringer 

## 2011-02-06 NOTE — Interval H&P Note (Signed)
History and Physical Interval Note:  02/06/2011 3:01 PM  [name]  has presented today for surgery, with the diagnosis of right nipple discharge with dilated ducts.  The various methods of treatment have been discussed with the patient and family. After consideration of risks, benefits and other options for treatment, the patient has consented to    Procedure(s):  BREAST BIOPSY as a surgical intervention .    The patients' history has been reviewed, patient examined, no change in status, stable for surgery.  I have reviewed the patients' chart and labs.  Questions were answered to the patient's satisfaction.    Velora Heckler, MD, FACS General & Endocrine Surgery [facility]  Molly Klein Judie Petit

## 2011-02-09 ENCOUNTER — Telehealth (INDEPENDENT_AMBULATORY_CARE_PROVIDER_SITE_OTHER): Payer: Self-pay | Admitting: Surgery

## 2011-02-09 NOTE — Progress Notes (Signed)
Quick Note:  Please contact patient with benign path results. TMG ______ 

## 2011-02-09 NOTE — Op Note (Signed)
NAME:  Molly Klein, A[place]ENBACH NO.:  [id number]  MEDICAL RECORD NO.:  [id number]  LOCATION:                                 FACILITY:  PHYSICIAN:  Velora Heckler, MD      DATE OF BIRTH:  April 25, 1960  DATE OF PROCEDURE:  02/06/2011                               OPERATIVE REPORT   PREOPERATIVE DIAGNOSIS:  Right breast nipple discharge.  POSTOPERATIVE DIAGNOSIS:  Right breast nipple discharge.  PROCEDURE:  Right breast excisional biopsy.  SURGEON:  Velora Heckler, MD, FACS  ANESTHESIA:  General anesthesia under the direction of Dr. Kelli Churn.  ESTIMATED BLOOD LOSS:  Minimal.  PREPARATION:  ChloraPrep.  COMPLICATIONS:  None.  INDICATIONS:  The patient is a 51 year old black female with a 2-1/2 month history of rust-colored right breast nipple discharge.  The patient has had extensive diagnostic testing including mammography, breast ultrasound, breast MRI, and 2 attempts at ductogram, which were unsuccessful.  The patient was referred to surgery and now comes to the operating room for excisional biopsy.  BODY OF REPORT:  Procedures was done in OR #5 at the [facility].  The patient was brought to the operating room, placed in supine position on the operating room table.  Following administration of general anesthesia, the patient was prepped and draped in the usual strict aseptic fashion.  After ascertaining that, an adequate level of anesthesia had been achieved, a circumareolar incision was made on the right side at the lateral aspect of the areola.  Dissection was carried into subcutaneous tissues.  Using skin hooks, the nipple was elevated. Electrocautery was used to dissect the tissue away from the posterior aspect of the nipple.  Dilated ducts are encountered, which contain a brown-colored thin fluid.  Approximately, a 4 cm x 3 cm excisional biopsy of this tissue is taken, which appears to encompass all of the dilated ducts.  Good  hemostasis was achieved with the electrocautery. Specimen was submitted fresh to Pathology for review.  Local anesthetic is infiltrated.  Incision was closed with a running 4-0 Monocryl subcuticular suture.  Wound was washed, and dried and benzoin Steri-Strips were applied.  Sterile dressings were applied.  The patient is awakened from Anesthesia and brought to the recovery room.  The patient tolerated the procedure well.   Velora Heckler, MD, FACS   TMG/MEDQ  D:  02/06/2011  T:  02/06/2011  Job:  [id number]  cc:   Janine Limbo, M.D.

## 2011-02-10 ENCOUNTER — Encounter (HOSPITAL_BASED_OUTPATIENT_CLINIC_OR_DEPARTMENT_OTHER): Payer: Self-pay | Admitting: Surgery

## 2011-02-10 NOTE — Addendum Note (Signed)
Addendum  created 02/10/11 1024 by Davidson Jacqulin Brandenburger, CRNA   Modules edited:Anesthesia Responsible Staff    

## 2011-02-10 NOTE — Addendum Note (Signed)
Addendum  created 02/10/11 1024 by Lance Coon, CRNA   Modules edited:Anesthesia Responsible Staff

## 2011-02-11 NOTE — Progress Notes (Signed)
Patient aware of results.

## 2011-02-22 ENCOUNTER — Emergency Department (HOSPITAL_COMMUNITY)
Admission: EM | Admit: 2011-02-22 | Discharge: 2011-02-22 | Disposition: A | Discharge status: Home / Self Care | Payer: Medicare Other | Attending: Emergency Medicine | Admitting: Emergency Medicine

## 2011-02-22 ENCOUNTER — Encounter (HOSPITAL_COMMUNITY): Payer: Self-pay | Admitting: Emergency Medicine

## 2011-02-22 DIAGNOSIS — Z79899 Other long term (current) drug therapy: Secondary | ICD-10-CM | POA: Insufficient documentation

## 2011-02-22 DIAGNOSIS — R079 Chest pain, unspecified: Secondary | ICD-10-CM | POA: Insufficient documentation

## 2011-02-22 DIAGNOSIS — N61 Mastitis without abscess: Secondary | ICD-10-CM | POA: Insufficient documentation

## 2011-02-22 DIAGNOSIS — Z86718 Personal history of other venous thrombosis and embolism: Secondary | ICD-10-CM | POA: Insufficient documentation

## 2011-02-22 DIAGNOSIS — K219 Gastro-esophageal reflux disease without esophagitis: Secondary | ICD-10-CM | POA: Insufficient documentation

## 2011-02-22 DIAGNOSIS — J45909 Unspecified asthma, uncomplicated: Secondary | ICD-10-CM | POA: Insufficient documentation

## 2011-02-22 DIAGNOSIS — B1920 Unspecified viral hepatitis C without hepatic coma: Secondary | ICD-10-CM | POA: Insufficient documentation

## 2011-02-22 MED ORDER — FLUCONAZOLE 150 MG PO TABS
150.0000 mg | ORAL_TABLET | Freq: Once | ORAL | Status: AC
  Administered 2011-02-22: 150 mg via ORAL
  Filled 2011-02-22: qty 1

## 2011-02-22 MED ORDER — DOXYCYCLINE HYCLATE 100 MG PO TABS
100.0000 mg | ORAL_TABLET | Freq: Once | ORAL | Status: AC
  Administered 2011-02-22: 100 mg via ORAL
  Filled 2011-02-22: qty 1

## 2011-02-22 MED ORDER — DOXYCYCLINE HYCLATE 100 MG PO CAPS: 100.0000 mg | ORAL_CAPSULE | Freq: Two times a day (BID) | ORAL | Status: AC

## 2011-02-22 MED ORDER — HYDROMORPHONE HCL 2 MG PO TABS: 2.0000 mg | ORAL_TABLET | ORAL | Status: AC | PRN

## 2011-02-22 NOTE — ED Notes (Signed)
Pt has a breast partial mastectomy on the rt side 2 weeks ago and now has redness, warm to touch, area is red below the nipple area, fever, states that she also has been a lot of antibioitcs and has been having vaginal itching and burning.

## 2011-02-22 NOTE — ED Provider Notes (Signed)
History     CSN: [id number]  Arrival date & time 02/22/11  1844   First MD Initiated Contact with Patient 02/22/11 1947      Chief Complaint  Patient presents with  . Breast Pain    (Consider location/radiation/quality/duration/timing/severity/associated sxs/prior treatment) The history is provided by the patient.   Claudie Fisherman is a 51 year old female status post right breast biopsy 2 weeks ago patient also states it was a partial meniscectomy this was done by [place] [facility] patient claims that she's been on some type of antibiotic but can't recall what that is. Today she got redness around the biopsy site of the right breasts warm to touch and painful. Subjective  fever no nausea vomiting or diarrhea.  Past Medical History  Diagnosis Date  . Asthma   . GERD (gastroesophageal reflux disease)   . Hepatitis C   . DVT (deep venous thrombosis)   . Nipple discharge     right breast  . Neuromuscular disorder     neuropathy lt arm    Past Surgical History  Procedure Date  . Cholecystectomy   . Pilonidal cyst excision   . Dilation and curettage of uterus   . Tonsillectomy   . Abdominal hysterectomy   . Fracture surgery     fx lt arm  . Breast biopsy 02/06/2011    Procedure: BREAST BIOPSY;  Surgeon: Velora Heckler, MD;  Location: [facility];  Service: General;  Laterality: Right;  right breast biopsy    Family History  Problem Relation Age of Onset  . Alcohol abuse    . Breast cancer    . Coronary artery disease    . Diabetes    . Cancer      BREAST  . Cancer Sister     pt unaware of what kind  . Cancer Maternal Grandmother     breast    History  Substance Use Topics  . Smoking status: Former Smoker    Quit date: 02/03/1975  . Smokeless tobacco: Not on file  . Alcohol Use: No    OB History    Grav Para Term Preterm Abortions TAB SAB Ect Mult Living                  Review of Systems  Constitutional: Positive for fever.  HENT: Negative for  congestion and neck pain.   Eyes: Negative for visual disturbance.  Respiratory: Negative for cough and shortness of breath.   Cardiovascular: Positive for chest pain.  Gastrointestinal: Negative for nausea, vomiting, diarrhea and abdominal distention.  Genitourinary: Negative for dysuria, vaginal bleeding and vaginal discharge.  Musculoskeletal: Negative for back pain.  Skin: Negative for rash.  Neurological: Negative for headaches.  Hematological: Does not bruise/bleed easily.    Allergies  Codeine; Interferon beta-1a; and Sulfonamide derivatives  Home Medications   Current Outpatient Rx  Name Route Sig Dispense Refill  . ALBUTEROL SULFATE HFA 108 (90 BASE) MCG/ACT IN AERS Inhalation Inhale 2 puffs into the lungs 4 (four) times daily.      . ALBUTEROL SULFATE (2.5 MG/3ML) 0.083% IN NEBU Nebulization Take 2.5 mg by nebulization as directed.      Marland Kitchen GABAPENTIN 300 MG PO CAPS Oral Take 300 mg by mouth every evening.     Marland Kitchen MODAFINIL 200 MG PO TABS Oral Take 200 mg by mouth daily.      Marland Kitchen NEBULIZER COMPRESSOR KIT Does not apply by Does not apply route.      . SPIRONOLACTONE  25 MG PO TABS Oral Take 25 mg by mouth daily.      Marland Kitchen DOXYCYCLINE HYCLATE 100 MG PO CAPS Oral Take 1 capsule (100 mg total) by mouth 2 (two) times daily. 14 capsule 0  . HYDROMORPHONE HCL 2 MG PO TABS Oral Take 1 tablet (2 mg total) by mouth every 4 (four) hours as needed for pain. 20 tablet 0    BP 141/82  Pulse 85  Temp(Src) 98.8 F (37.1 C) (Rectal)  Resp 18  SpO2 98%  Physical Exam  Nursing note and vitals reviewed. Constitutional: She is oriented to person, place, and time. She appears well-developed and well-nourished.  HENT:  Head: Normocephalic and atraumatic.  Mouth/Throat: Oropharynx is clear and moist.  Eyes: Conjunctivae are normal. Pupils are equal, round, and reactive to light.  Neck: Normal range of motion. Neck supple.  Cardiovascular: Normal rate and normal heart sounds.   No murmur  heard. Pulmonary/Chest: Effort normal and breath sounds normal.       4 right breast area around the site of the breast biopsy no purulent discharge erythema mild induration no fluctuance area of erythema is about 3 cm to 4 cm in size  Abdominal: Soft. Bowel sounds are normal. There is no tenderness.  Musculoskeletal: Normal range of motion.  Neurological: She is alert and oriented to person, place, and time. No cranial nerve deficit. She exhibits normal muscle tone. Coordination normal.  Skin: Skin is warm. No rash noted. There is erythema.    ED Course  Procedures (including critical care time)  Labs Reviewed - No data to display No results found.   1. Cellulitis of breast       MDM  Breasts status post biopsy and patient states partial mastectomy 2 weeks ago at [facility] surgery. He started with redness and warm to touch around the nipple area where the biopsy was done subjective fever patient states that she's been on some type of antibiotic but doesn't know what type. 7 patient is consistent with a mild cellulitis to the right breast area around the site of the surgical biopsy no evidence of fluctuance mild induration mostly erythema with increased warmth. No purulent discharge.  In the emergency department patient treated with doxycycline 100 mg by mouth as first dose and also treated with Diflucan 150 mg because she states that she tends to get yeast infections with antibiotics and thinks that she's got some mild vaginal burning going on now undermined her yeast infection. Denies any vaginal discharge.   Deferred to [facility] surgery for followup in the next 2 days. Continued on doxycycline for 7 day course.        Shelda Jakes, MD 02/22/11 [phone]

## 2011-02-23 ENCOUNTER — Encounter (INDEPENDENT_AMBULATORY_CARE_PROVIDER_SITE_OTHER): Payer: Self-pay | Admitting: Surgery

## 2011-02-23 ENCOUNTER — Ambulatory Visit (INDEPENDENT_AMBULATORY_CARE_PROVIDER_SITE_OTHER): Payer: Medicare HMO | Admitting: Surgery

## 2011-02-23 VITALS — BP 146/96 | HR 74 | Temp 98.2°F | Resp 18 | Ht 63.0 in | Wt 226.2 lb

## 2011-02-23 DIAGNOSIS — Z09 Encounter for follow-up examination after completed treatment for conditions other than malignant neoplasm: Secondary | ICD-10-CM

## 2011-02-23 NOTE — Progress Notes (Signed)
Subjective:     Patient ID: Molly Klein, female   DOB: 07/15/1960, 51 y.o.   MRN: [id number]  HPI This is a patient of Dr. Ardine Eng who is status post a right breast lumpectomy on January 4 with the findings of a papilloma. She presented to the emergency department on January 20 with erythema and tenderness of the breast. She is now on doxycycline.  Review of Systems     Objective:   Physical Exam On exam, there is some erythema around the incision but no drainage or fluctuants    Assessment:     Postop incisional cellulitis    Plan:     She will continue the antibiotics and keep her appointment with Dr. Gerrit Friends

## 2011-03-05 ENCOUNTER — Ambulatory Visit (INDEPENDENT_AMBULATORY_CARE_PROVIDER_SITE_OTHER): Payer: Medicare HMO | Admitting: Surgery

## 2011-03-05 ENCOUNTER — Encounter (INDEPENDENT_AMBULATORY_CARE_PROVIDER_SITE_OTHER): Payer: Self-pay | Admitting: Surgery

## 2011-03-05 VITALS — BP 138/82 | HR 70 | Temp 98.3°F | Resp 18 | Ht 63.0 in | Wt 225.8 lb

## 2011-03-05 DIAGNOSIS — N6459 Other signs and symptoms in breast: Secondary | ICD-10-CM

## 2011-03-05 DIAGNOSIS — N6452 Nipple discharge: Secondary | ICD-10-CM

## 2011-03-05 NOTE — Patient Instructions (Signed)
  COCOA BUTTER & VITAMIN E CREAM  ([employer] or other brand)  Apply cocoa butter/vitamin E cream to your incision 2 - 3 times daily.  Massage cream into incision for one minute with each application.  Use sunscreen (50 SPF or higher) for first 6 months after surgery if area is exposed to sun.  You may substitute Mederma or other scar reducing creams as desired.   

## 2011-03-05 NOTE — Progress Notes (Signed)
Visit Diagnoses: 1. Nipple discharge in female, right     HISTORY: Patient is a 51 year old black female who underwent excisional biopsy from the right breast for nipple discharge. 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 In the background were fibrocystic changes. There was no atypia and no evidence of malignancy. She returns today for wound check.  EXAM: And appears to be healing nicely. Steri-Strips are removed in the office today. There is no drainage. There is no fluctuance. There is no erythema.  IMPRESSION: Status post right breast excisional biopsy for intraductal papilloma and duct ectasia  PLAN: Patient will cleanse the wound to remove the adhesive. She will then begin applying topical creams to the incision. I've asked her to return in 3 weeks for final wound check.  Velora Heckler, MD, FACS General & Endocrine Surgery [facility]

## 2011-03-26 ENCOUNTER — Encounter (INDEPENDENT_AMBULATORY_CARE_PROVIDER_SITE_OTHER): Payer: Medicare HMO | Admitting: Surgery

## 2011-03-27 ENCOUNTER — Ambulatory Visit: Payer: Self-pay | Admitting: Family Medicine

## 2011-05-08 ENCOUNTER — Encounter: Payer: Self-pay | Admitting: Family Medicine

## 2011-06-01 ENCOUNTER — Telehealth: Payer: Self-pay | Admitting: Family Medicine

## 2011-06-01 ENCOUNTER — Ambulatory Visit (INDEPENDENT_AMBULATORY_CARE_PROVIDER_SITE_OTHER): Payer: Medicare Other | Admitting: Internal Medicine

## 2011-06-01 VITALS — BP 138/86 | HR 79 | Temp 97.6°F | Wt 228.0 lb

## 2011-06-01 DIAGNOSIS — R109 Unspecified abdominal pain: Secondary | ICD-10-CM

## 2011-06-01 LAB — POCT URINALYSIS DIPSTICK
Nitrite, UA: NEGATIVE
Spec Grav, UA: 1.02
Urobilinogen, UA: 0.2
pH, UA: 6

## 2011-06-01 MED ORDER — CYCLOBENZAPRINE HCL 10 MG PO TABS: 10.0000 mg | ORAL_TABLET | Freq: Two times a day (BID) | ORAL | Status: AC | PRN

## 2011-06-01 MED ORDER — HYDROCODONE-ACETAMINOPHEN 5-500 MG PO TABS: 1.0000 | ORAL_TABLET | Freq: Four times a day (QID) | ORAL | Status: DC | PRN

## 2011-06-01 NOTE — Telephone Encounter (Signed)
Noted. Agree w/ appt.

## 2011-06-01 NOTE — Progress Notes (Signed)
  Subjective:    Patient ID: Molly Klein, female    DOB: 04-17-1960, 51 y.o.   MRN: [id number]  HPI Acute visit One-week history of right flank pain, pain is intense at times, it is a steady, somehow worse when he decreased or bends her torso. She took a leftover hydrocodone which helped. Pain does not change with eating. Has seen no rash.  Past Medical History  Diagnosis Date  . Asthma   . GERD (gastroesophageal reflux disease)   . Hepatitis C   . DVT (deep venous thrombosis)   . Nipple discharge     right breast  . Neuromuscular disorder     neuropathy lt arm   Past Surgical History  Procedure Date  . Cholecystectomy   . Pilonidal cyst excision   . Dilation and curettage of uterus   . Tonsillectomy   . Abdominal hysterectomy   . Fracture surgery     fx lt arm  . Breast biopsy 02/06/2011    Procedure: BREAST BIOPSY;  Surgeon: Velora Heckler, MD;  Location:  SUR[facility]ERY [facility];  Service: General;  Laterality: Right;  right breast biopsy      Review of Systems No fever or chills No nausea, vomiting, diarrhea. He has overactive bladder, urinary frequency at baseline, and denies any dysuria or gross hematuria. History of DVT, no recent airplane trips or swelling.     Objective:   Physical Exam  Constitutional: She is oriented to person, place, and time. She appears well-developed. No distress.  HENT:  Head: Normocephalic and atraumatic.  Cardiovascular: Normal rate, regular rhythm and normal heart sounds.   No murmur heard. Pulmonary/Chest: Effort normal and breath sounds normal. No respiratory distress. Wheezes: scattered wheezing, no distres   Abdominal:       Soft, not distended, slightly tender at the right upper quadrant without mass or rebound.   Musculoskeletal: She exhibits no edema.       Arms:      calves symmetric and nontender  Neurological: She is alert and oriented to person, place, and time. She exhibits normal muscle tone.  Skin: She is  not diaphoretic.       Assessment & Plan:

## 2011-06-01 NOTE — Assessment & Plan Note (Addendum)
51 year old lady with history of overactive bladder and a  DVT in the past who presents with right flank pain, features are mostly musculoskeletal. She is also tender on the right upper quadrant, history of a cholecystectomy. Symptoms are decreased with hydrocodone. Review of systems is essentially negative for red flags. Given pain distribution --> this could be atypical shingles or a disc issue. Plan: Treat as a musculoskeletal problem Urine culture Will call if symptoms increase, fever chills, severe abdominal pain.

## 2011-06-01 NOTE — Telephone Encounter (Signed)
Caller: Alazia/Patient; PCP: Lelon Perla.; CB#: [phone]; ; ; Call regarding Back Pain;  Pt has moderate lower back x 1 week. No injury. Pt is afeb. Pt describes it as a deep muscle ache feeling in the lower back not repsponding to home tx. RN triaged and advised appt per back sx protocol. Appt scheduled at 11:30 today with Dr. Drue Novel

## 2011-06-01 NOTE — Patient Instructions (Signed)
No heavy lifting, local heat to the area of pain. Take Vicodin and Flexeril as needed, they will cause drowsiness, be careful. Call if the pain is not improving in the next 10 days Call anytime if the pain is severe, you have fever, stomach pain, blood in the urine or a rash.

## 2011-06-03 LAB — URINE CULTURE: Colony Count: >=100000

## 2011-06-05 ENCOUNTER — Telehealth: Payer: Self-pay | Admitting: Internal Medicine

## 2011-06-05 MED ORDER — HYDROCODONE-ACETAMINOPHEN 5-500 MG PO TABS: 1.0000 | ORAL_TABLET | Freq: Four times a day (QID) | ORAL | Status: AC | PRN

## 2011-06-05 NOTE — Telephone Encounter (Signed)
We checked on the patient today, she continue with back pain: recommend pt to continue hydrocodone, flexeril, ER  if symptoms severe, fever or chills. Call refill of hydrocodone, #30 if needed. Schedule a visit for  early next week with PCP

## 2011-06-05 NOTE — Telephone Encounter (Signed)
Scheduled pt for 5.8.13. Sent in rx.

## 2011-06-10 ENCOUNTER — Ambulatory Visit (INDEPENDENT_AMBULATORY_CARE_PROVIDER_SITE_OTHER): Payer: Medicare HMO | Admitting: Internal Medicine

## 2011-06-10 ENCOUNTER — Encounter: Payer: Self-pay | Admitting: Internal Medicine

## 2011-06-10 VITALS — BP 132/84 | HR 77 | Temp 97.6°F | Wt 228.0 lb

## 2011-06-10 DIAGNOSIS — J45909 Unspecified asthma, uncomplicated: Secondary | ICD-10-CM

## 2011-06-10 DIAGNOSIS — K219 Gastro-esophageal reflux disease without esophagitis: Secondary | ICD-10-CM

## 2011-06-10 DIAGNOSIS — M549 Dorsalgia, unspecified: Secondary | ICD-10-CM

## 2011-06-10 DIAGNOSIS — R109 Unspecified abdominal pain: Secondary | ICD-10-CM

## 2011-06-10 LAB — POCT URINALYSIS DIPSTICK
Blood, UA: NEGATIVE
Ketones, UA: NEGATIVE
Spec Grav, UA: 1.025
Urobilinogen, UA: 0.2

## 2011-06-10 MED ORDER — ESOMEPRAZOLE MAGNESIUM 40 MG PO CPDR: 40.0000 mg | DELAYED_RELEASE_CAPSULE | Freq: Every day | ORAL | Status: DC

## 2011-06-10 NOTE — Assessment & Plan Note (Signed)
Off nexium x a while , having sx  Plan: RF nexium, further eval if no better

## 2011-06-10 NOTE — Assessment & Plan Note (Signed)
Needs a new nebulizer, will ask [facility] to deliver

## 2011-06-10 NOTE — Progress Notes (Signed)
  Subjective:    Patient ID: Molly Klein, female    DOB: [birth year], 51 y.o.   MRN: [id number]  HPI  Followup from previous visit Right flank pain has subsided, she never developed any rash. She does complain of GERD symptoms, she used to be on Nexium but quit a while back.  Past Medical History  Diagnosis Date  . Asthma   . GERD (gastroesophageal reflux disease)   . Hepatitis C   . DVT (deep venous thrombosis)   . Nipple discharge     right breast  . Neuromuscular disorder     neuropathy lt arm   Past Surgical History  Procedure Date  . Cholecystectomy   . Pilonidal cyst excision   . Dilation and curettage of uterus   . Tonsillectomy   . Abdominal hysterectomy   . Fracture surgery     fx lt arm  . Breast biopsy 02/06/2011    Procedure: BREAST BIOPSY;  Surgeon: Velora Heckler, MD;  Location: [facility];  Service: General;  Laterality: Right;  right breast biopsy     Review of Systems No dysuria or gross hematuria. No anterior abdominal pain. No dysphasia or odynophagia. No fever chills. No blood in the stools.     Objective:   Physical Exam  General -- alert, well-developed, and overweight appearing. No apparent distress.  Neck --no LADs Lungs -- normal respiratory effort, no intercostal retractions, no accessory muscle use, and normal breath sounds.   Heart-- normal rate, regular rhythm, no murmur, and no gallop.   Abdomen--soft,no distention,  no HSM, no guarding, and no rigidity.  She is a slightly tender at the epigastric area and also a the left side of the abdomen without mass or rebound. Extremities-- no pretibial edema bilaterally  Neurologic-- alert & oriented X3 and strength normal in all extremities. Psych-- Cognition and judgment appear intact. Alert and cooperative with normal attention span and concentration.  not anxious appearing and not depressed appearing.       Assessment & Plan:

## 2011-06-10 NOTE — Assessment & Plan Note (Addendum)
Flank pain better, she continued to be tender at the upper abdomen and now at the left side. Etiology unclear, I don't think GERD explains to the left side tenderness. Plan: BMP, CBC, CT.

## 2011-06-10 NOTE — Patient Instructions (Signed)
Please see Dr. Laury Axon at your earliest convenience for a physical exam.

## 2011-06-12 LAB — CULTURE, URINE COMPREHENSIVE: Colony Count: 15000

## 2011-06-15 NOTE — Progress Notes (Signed)
Addended by: Edwena Felty T on: 06/15/2011 10:25 AM   Modules accepted: Orders

## 2011-06-18 ENCOUNTER — Ambulatory Visit (INDEPENDENT_AMBULATORY_CARE_PROVIDER_SITE_OTHER)
Admission: RE | Admit: 2011-06-18 | Discharge: 2011-06-18 | Disposition: A | Discharge status: Home / Self Care | Payer: Medicare HMO | Source: Ambulatory Visit | Attending: Internal Medicine | Admitting: Internal Medicine

## 2011-06-18 ENCOUNTER — Other Ambulatory Visit (INDEPENDENT_AMBULATORY_CARE_PROVIDER_SITE_OTHER): Payer: Medicare HMO

## 2011-06-18 DIAGNOSIS — R109 Unspecified abdominal pain: Secondary | ICD-10-CM

## 2011-06-18 DIAGNOSIS — E785 Hyperlipidemia, unspecified: Secondary | ICD-10-CM

## 2011-06-18 LAB — COMPREHENSIVE METABOLIC PANEL
Albumin: 3.9 g/dL (ref 3.5–5.2)
BUN: 13 mg/dL (ref 6–23)
CO2: 27 mEq/L (ref 19–32)
GFR: 110.71 mL/min (ref >60.00)
Glucose, Bld: 88 mg/dL (ref 70–99)
Potassium: 3.6 mEq/L (ref 3.5–5.1)
Sodium: 138 mEq/L (ref 135–145)
Total Protein: 7.3 g/dL (ref 6.0–8.3)

## 2011-06-18 LAB — CBC WITH DIFFERENTIAL/PLATELET
Eosinophils Relative: 2.3 % (ref 0.0–5.0)
Lymphocytes Relative: 29.2 % (ref 12.0–46.0)
Monocytes Relative: 4.4 % (ref 3.0–12.0)
Neutrophils Relative %: 63.3 % (ref 43.0–77.0)
Platelets: 152 10*3/uL (ref 150.0–400.0)
WBC: 4.4 10*3/uL — ABNORMAL LOW (ref 4.5–10.5)

## 2011-06-18 NOTE — Progress Notes (Signed)
Labs only

## 2011-06-21 ENCOUNTER — Telehealth: Payer: Self-pay | Admitting: Internal Medicine

## 2011-06-21 NOTE — Telephone Encounter (Signed)
Call pt: CT showed a normal abdomen but has inflammation at the base of R lung, ?infection. She did have pain in that area Plan: avelox 400 mg 1 po qd x 7 days Repeat CT chest w/o in 6 months Call if problems, questions, fever, cough, more pain ----------------------------------------------------------------------------------- CT REPORT: Region of ill-defined ground-glass attenuation with ill-defined pleural based 8 mm nodule in the posterobasal segment of the right lower lobe. Overall appearance is strongly favored to be of infectious or inflammatory etiology, potentially from mild aspiration. However, if the patient is at high risk for bronchogenic carcinoma, follow-up chest CT at 3-6 months is recommended. If the patient is at low risk for bronchogenic carcinoma, follow-up chest CT at 6-12 months is recommended. This recommendation follows the consensus statement: Guidelines for Management of Small Pulmonary Nodules Detected on CT Scans: A Statement from the Fleischner Society as published in Radiology 2005; 237:395-400.

## 2011-06-22 MED ORDER — MOXIFLOXACIN HCL 400 MG PO TABS: 400.0000 mg | ORAL_TABLET | Freq: Every day | ORAL | Status: AC

## 2011-06-22 NOTE — Telephone Encounter (Signed)
Discussed with pt, sent in rx.  

## 2011-07-07 ENCOUNTER — Encounter: Payer: Self-pay | Admitting: Family Medicine

## 2011-07-07 ENCOUNTER — Ambulatory Visit (INDEPENDENT_AMBULATORY_CARE_PROVIDER_SITE_OTHER): Payer: Medicare HMO | Admitting: Family Medicine

## 2011-07-07 ENCOUNTER — Other Ambulatory Visit (HOSPITAL_COMMUNITY)
Admission: RE | Admit: 2011-07-07 | Discharge: 2011-07-07 | Disposition: A | Discharge status: Home / Self Care | Payer: Medicare HMO | Source: Ambulatory Visit | Attending: Family Medicine | Admitting: Family Medicine

## 2011-07-07 VITALS — BP 130/80 | HR 83 | Temp 98.3°F | Ht 63.5 in | Wt 230.6 lb

## 2011-07-07 DIAGNOSIS — K219 Gastro-esophageal reflux disease without esophagitis: Secondary | ICD-10-CM

## 2011-07-07 DIAGNOSIS — I10 Essential (primary) hypertension: Secondary | ICD-10-CM

## 2011-07-07 DIAGNOSIS — R9389 Abnormal findings on diagnostic imaging of other specified body structures: Secondary | ICD-10-CM

## 2011-07-07 DIAGNOSIS — Z1211 Encounter for screening for malignant neoplasm of colon: Secondary | ICD-10-CM

## 2011-07-07 DIAGNOSIS — J45909 Unspecified asthma, uncomplicated: Secondary | ICD-10-CM

## 2011-07-07 DIAGNOSIS — Z79899 Other long term (current) drug therapy: Secondary | ICD-10-CM

## 2011-07-07 DIAGNOSIS — Z01419 Encounter for gynecological examination (general) (routine) without abnormal findings: Secondary | ICD-10-CM | POA: Insufficient documentation

## 2011-07-07 DIAGNOSIS — Z124 Encounter for screening for malignant neoplasm of cervix: Secondary | ICD-10-CM

## 2011-07-07 DIAGNOSIS — Z0000 Encounter for general adult medical examination without abnormal findings: Secondary | ICD-10-CM

## 2011-07-07 DIAGNOSIS — G8320 Monoplegia of upper limb affecting unspecified side: Secondary | ICD-10-CM | POA: Insufficient documentation

## 2011-07-07 LAB — CBC WITH DIFFERENTIAL/PLATELET
Basophils Absolute: 0 10*3/uL (ref 0.0–0.1)
Basophils Relative: 0.9 % (ref 0.0–3.0)
Eosinophils Absolute: 0.1 10*3/uL (ref 0.0–0.7)
HCT: 43.6 % (ref 36.0–46.0)
Hemoglobin: 14.9 g/dL (ref 12.0–15.0)
Lymphocytes Relative: 34.1 % (ref 12.0–46.0)
Lymphs Abs: 1.4 10*3/uL (ref 0.7–4.0)
MCHC: 34.1 g/dL (ref 30.0–36.0)
MCV: 89.8 fl (ref 78.0–100.0)
Monocytes Absolute: 0.2 10*3/uL (ref 0.1–1.0)
Neutro Abs: 2.4 10*3/uL (ref 1.4–7.7)
RBC: 4.86 Mil/uL (ref 3.87–5.11)
RDW: 14.3 % (ref 11.5–14.6)

## 2011-07-07 LAB — LIPID PANEL
Cholesterol: 136 mg/dL (ref 0–200)
HDL: 51.8 mg/dL (ref >39.00)
Total CHOL/HDL Ratio: 3
Triglycerides: 72 mg/dL (ref 0.0–149.0)

## 2011-07-07 LAB — BASIC METABOLIC PANEL
CO2: 29 mEq/L (ref 19–32)
Chloride: 105 mEq/L (ref 96–112)
GFR: 116.34 mL/min (ref >60.00)
Glucose, Bld: 79 mg/dL (ref 70–99)
Potassium: 3.5 mEq/L (ref 3.5–5.1)
Sodium: 143 mEq/L (ref 135–145)

## 2011-07-07 LAB — HEPATIC FUNCTION PANEL
ALT: 22 U/L (ref 0–35)
Albumin: 3.9 g/dL (ref 3.5–5.2)
Total Protein: 7.8 g/dL (ref 6.0–8.3)

## 2011-07-07 MED ORDER — ALBUTEROL SULFATE HFA 108 (90 BASE) MCG/ACT IN AERS: 2.0000 | INHALATION_SPRAY | Freq: Four times a day (QID) | RESPIRATORY_TRACT | Status: DC

## 2011-07-07 MED ORDER — FLUCONAZOLE 150 MG PO TABS: 150.0000 mg | ORAL_TABLET | Freq: Once | ORAL | Status: AC

## 2011-07-07 NOTE — Assessment & Plan Note (Signed)
Stable con't meds 

## 2011-07-07 NOTE — Assessment & Plan Note (Signed)
Stable Refill inhaler 

## 2011-07-07 NOTE — Patient Instructions (Signed)
Preventive Care for Adults, Female A healthy lifestyle and preventive care can promote health and wellness. Preventive health guidelines for women include the following key practices.  A routine yearly physical is a good way to check with your caregiver about your health and preventive screening. It is a chance to share any concerns and updates on your health, and to receive a thorough exam.   Visit your dentist for a routine exam and preventive care every 6 months. Brush your teeth twice a day and floss once a day. Good oral hygiene prevents tooth decay and gum disease.   The frequency of eye exams is based on your age, health, family medical history, use of contact lenses, and other factors. Follow your caregiver's recommendations for frequency of eye exams.   Eat a healthy diet. Foods like vegetables, fruits, whole grains, low-fat dairy products, and lean protein foods contain the nutrients you need without too many calories. Decrease your intake of foods high in solid fats, added sugars, and salt. Eat the right amount of calories for you.Get information about a proper diet from your caregiver, if necessary.   Regular physical exercise is one of the most important things you can do for your health. Most adults should get at least 150 minutes of moderate-intensity exercise (any activity that increases your heart rate and causes you to sweat) each week. In addition, most adults need muscle-strengthening exercises on 2 or more days a week.   Maintain a healthy weight. The body mass index (BMI) is a screening tool to identify possible weight problems. It provides an estimate of body fat based on height and weight. Your caregiver can help determine your BMI, and can help you achieve or maintain a healthy weight.For adults 20 years and older:   A BMI below 18.5 is considered underweight.   A BMI of 18.5 to 24.9 is normal.   A BMI of 25 to 29.9 is considered overweight.   A BMI of 30 and above is  considered obese.   Maintain normal blood lipids and cholesterol levels by exercising and minimizing your intake of saturated fat. Eat a balanced diet with plenty of fruit and vegetables. Blood tests for lipids and cholesterol should begin at age 20 and be repeated every 5 years. If your lipid or cholesterol levels are high, you are over 50, or you are at high risk for heart disease, you may need your cholesterol levels checked more frequently.Ongoing high lipid and cholesterol levels should be treated with medicines if diet and exercise are not effective.   If you smoke, find out from your caregiver how to quit. If you do not use tobacco, do not start.   If you are pregnant, do not drink alcohol. If you are breastfeeding, be very cautious about drinking alcohol. If you are not pregnant and choose to drink alcohol, do not exceed 1 drink per day. One drink is considered to be 12 ounces (355 mL) of beer, 5 ounces (148 mL) of wine, or 1.5 ounces (44 mL) of liquor.   Avoid use of street drugs. Do not share needles with anyone. Ask for help if you need support or instructions about stopping the use of drugs.   High blood pressure causes heart disease and increases the risk of stroke. Your blood pressure should be checked at least every 1 to 2 years. Ongoing high blood pressure should be treated with medicines if weight loss and exercise are not effective.   If you are 55 to 53   years old, ask your caregiver if you should take aspirin to prevent strokes.   Diabetes screening involves taking a blood sample to check your fasting blood sugar level. This should be done once every 3 years, after age 45, if you are within normal weight and without risk factors for diabetes. Testing should be considered at a younger age or be carried out more frequently if you are overweight and have at least 1 risk factor for diabetes.   Breast cancer screening is essential preventive care for women. You should practice "breast  self-awareness." This means understanding the normal appearance and feel of your breasts and may include breast self-examination. Any changes detected, no matter how small, should be reported to a caregiver. Women in their 20s and 30s should have a clinical breast exam (CBE) by a caregiver as part of a regular health exam every 1 to 3 years. After age 40, women should have a CBE every year. Starting at age 40, women should consider having a mammography (breast X-ray test) every year. Women who have a family history of breast cancer should talk to their caregiver about genetic screening. Women at a high risk of breast cancer should talk to their caregivers about having magnetic resonance imaging (MRI) and a mammography every year.   The Pap test is a screening test for cervical cancer. A Pap test can show cell changes on the cervix that might become cervical cancer if left untreated. A Pap test is a procedure in which cells are obtained and examined from the lower end of the uterus (cervix).   Women should have a Pap test starting at age 21.   Between ages 21 and 29, Pap tests should be repeated every 2 years.   Beginning at age 30, you should have a Pap test every 3 years as long as the past 3 Pap tests have been normal.   Some women have medical problems that increase the chance of getting cervical cancer. Talk to your caregiver about these problems. It is especially important to talk to your caregiver if a new problem develops soon after your last Pap test. In these cases, your caregiver may recommend more frequent screening and Pap tests.   The above recommendations are the same for women who have or have not gotten the vaccine for human papillomavirus (HPV).   If you had a hysterectomy for a problem that was not cancer or a condition that could lead to cancer, then you no longer need Pap tests. Even if you no longer need a Pap test, a regular exam is a good idea to make sure no other problems are  starting.   If you are between ages 65 and 70, and you have had normal Pap tests going back 10 years, you no longer need Pap tests. Even if you no longer need a Pap test, a regular exam is a good idea to make sure no other problems are starting.   If you have had past treatment for cervical cancer or a condition that could lead to cancer, you need Pap tests and screening for cancer for at least 20 years after your treatment.   If Pap tests have been discontinued, risk factors (such as a new sexual partner) need to be reassessed to determine if screening should be resumed.   The HPV test is an additional test that may be used for cervical cancer screening. The HPV test looks for the virus that can cause the cell changes on the cervix.   The cells collected during the Pap test can be tested for HPV. The HPV test could be used to screen women aged 30 years and older, and should be used in women of any age who have unclear Pap test results. After the age of 30, women should have HPV testing at the same frequency as a Pap test.   Colorectal cancer can be detected and often prevented. Most routine colorectal cancer screening begins at the age of 50 and continues through age 75. However, your caregiver may recommend screening at an earlier age if you have risk factors for colon cancer. On a yearly basis, your caregiver may provide home test kits to check for hidden blood in the stool. Use of a small camera at the end of a tube, to directly examine the colon (sigmoidoscopy or colonoscopy), can detect the earliest forms of colorectal cancer. Talk to your caregiver about this at age 50, when routine screening begins. Direct examination of the colon should be repeated every 5 to 10 years through age 75, unless early forms of pre-cancerous polyps or small growths are found.   Hepatitis C blood testing is recommended for all people born from 1945 through 1965 and any individual with known risks for hepatitis C.    Practice safe sex. Use condoms and avoid high-risk sexual practices to reduce the spread of sexually transmitted infections (STIs). STIs include gonorrhea, chlamydia, syphilis, trichomonas, herpes, HPV, and human immunodeficiency virus (HIV). Herpes, HIV, and HPV are viral illnesses that have no cure. They can result in disability, cancer, and death. Sexually active women aged 25 and younger should be checked for chlamydia. Older women with new or multiple partners should also be tested for chlamydia. Testing for other STIs is recommended if you are sexually active and at increased risk.   Osteoporosis is a disease in which the bones lose minerals and strength with aging. This can result in serious bone fractures. The risk of osteoporosis can be identified using a bone density scan. Women ages 65 and over and women at risk for fractures or osteoporosis should discuss screening with their caregivers. Ask your caregiver whether you should take a calcium supplement or vitamin D to reduce the rate of osteoporosis.   Menopause can be associated with physical symptoms and risks. Hormone replacement therapy is available to decrease symptoms and risks. You should talk to your caregiver about whether hormone replacement therapy is right for you.   Use sunscreen with sun protection factor (SPF) of 30 or more. Apply sunscreen liberally and repeatedly throughout the day. You should seek shade when your shadow is shorter than you. Protect yourself by wearing long sleeves, pants, a wide-brimmed hat, and sunglasses year round, whenever you are outdoors.   Once a month, do a whole body skin exam, using a mirror to look at the skin on your back. Notify your caregiver of new moles, moles that have irregular borders, moles that are larger than a pencil eraser, or moles that have changed in shape or color.   Stay current with required immunizations.   Influenza. You need a dose every fall (or winter). The composition of  the flu vaccine changes each year, so being vaccinated once is not enough.   Pneumococcal polysaccharide. You need 1 to 2 doses if you smoke cigarettes or if you have certain chronic medical conditions. You need 1 dose at age 65 (or older) if you have never been vaccinated.   Tetanus, diphtheria, pertussis (Tdap, Td). Get 1 dose of   Tdap vaccine if you are younger than age 65, are over 65 and have contact with an infant, are a healthcare worker, are pregnant, or simply want to be protected from whooping cough. After that, you need a Td booster dose every 10 years. Consult your caregiver if you have not had at least 3 tetanus and diphtheria-containing shots sometime in your life or have a deep or dirty wound.   HPV. You need this vaccine if you are a woman age 26 or younger. The vaccine is given in 3 doses over 6 months.   Measles, mumps, rubella (MMR). You need at least 1 dose of MMR if you were born in 1957 or later. You may also need a second dose.   Meningococcal. If you are age 19 to 21 and a first-year college student living in a residence hall, or have one of several medical conditions, you need to get vaccinated against meningococcal disease. You may also need additional booster doses.   Zoster (shingles). If you are age 60 or older, you should get this vaccine.   Varicella (chickenpox). If you have never had chickenpox or you were vaccinated but received only 1 dose, talk to your caregiver to find out if you need this vaccine.   Hepatitis A. You need this vaccine if you have a specific risk factor for hepatitis A virus infection or you simply wish to be protected from this disease. The vaccine is usually given as 2 doses, 6 to 18 months apart.   Hepatitis B. You need this vaccine if you have a specific risk factor for hepatitis B virus infection or you simply wish to be protected from this disease. The vaccine is given in 3 doses, usually over 6 months.  Preventive Services /  Frequency Ages 19 to 39  Blood pressure check.** / Every 1 to 2 years.   Lipid and cholesterol check.** / Every 5 years beginning at age 20.   Clinical breast exam.** / Every 3 years for women in their 20s and 30s.   Pap test.** / Every 2 years from ages 21 through 29. Every 3 years starting at age 30 through age 65 or 70 with a history of 3 consecutive normal Pap tests.   HPV screening.** / Every 3 years from ages 30 through ages 65 to 70 with a history of 3 consecutive normal Pap tests.   Hepatitis C blood test.** / For any individual with known risks for hepatitis C.   Skin self-exam. / Monthly.   Influenza immunization.** / Every year.   Pneumococcal polysaccharide immunization.** / 1 to 2 doses if you smoke cigarettes or if you have certain chronic medical conditions.   Tetanus, diphtheria, pertussis (Tdap, Td) immunization. / A one-time dose of Tdap vaccine. After that, you need a Td booster dose every 10 years.   HPV immunization. / 3 doses over 6 months, if you are 26 and younger.   Measles, mumps, rubella (MMR) immunization. / You need at least 1 dose of MMR if you were born in 1957 or later. You may also need a second dose.   Meningococcal immunization. / 1 dose if you are age 19 to 21 and a first-year college student living in a residence hall, or have one of several medical conditions, you need to get vaccinated against meningococcal disease. You may also need additional booster doses.   Varicella immunization.** / Consult your caregiver.   Hepatitis A immunization.** / Consult your caregiver. 2 doses, 6 to 18 months   apart.   Hepatitis B immunization.** / Consult your caregiver. 3 doses usually over 6 months.  Ages 40 to 64  Blood pressure check.** / Every 1 to 2 years.   Lipid and cholesterol check.** / Every 5 years beginning at age 20.   Clinical breast exam.** / Every year after age 40.   Mammogram.** / Every year beginning at age 40 and continuing for as  long as you are in good health. Consult with your caregiver.   Pap test.** / Every 3 years starting at age 30 through age 65 or 70 with a history of 3 consecutive normal Pap tests.   HPV screening.** / Every 3 years from ages 30 through ages 65 to 70 with a history of 3 consecutive normal Pap tests.   Fecal occult blood test (FOBT) of stool. / Every year beginning at age 50 and continuing until age 75. You may not need to do this test if you get a colonoscopy every 10 years.   Flexible sigmoidoscopy or colonoscopy.** / Every 5 years for a flexible sigmoidoscopy or every 10 years for a colonoscopy beginning at age 50 and continuing until age 75.   Hepatitis C blood test.** / For all people born from 1945 through 1965 and any individual with known risks for hepatitis C.   Skin self-exam. / Monthly.   Influenza immunization.** / Every year.   Pneumococcal polysaccharide immunization.** / 1 to 2 doses if you smoke cigarettes or if you have certain chronic medical conditions.   Tetanus, diphtheria, pertussis (Tdap, Td) immunization.** / A one-time dose of Tdap vaccine. After that, you need a Td booster dose every 10 years.   Measles, mumps, rubella (MMR) immunization. / You need at least 1 dose of MMR if you were born in 1957 or later. You may also need a second dose.   Varicella immunization.** / Consult your caregiver.   Meningococcal immunization.** / Consult your caregiver.   Hepatitis A immunization.** / Consult your caregiver. 2 doses, 6 to 18 months apart.   Hepatitis B immunization.** / Consult your caregiver. 3 doses, usually over 6 months.  Ages 65 and over  Blood pressure check.** / Every 1 to 2 years.   Lipid and cholesterol check.** / Every 5 years beginning at age 20.   Clinical breast exam.** / Every year after age 40.   Mammogram.** / Every year beginning at age 40 and continuing for as long as you are in good health. Consult with your caregiver.   Pap test.** /  Every 3 years starting at age 30 through age 65 or 70 with a 3 consecutive normal Pap tests. Testing can be stopped between 65 and 70 with 3 consecutive normal Pap tests and no abnormal Pap or HPV tests in the past 10 years.   HPV screening.** / Every 3 years from ages 30 through ages 65 or 70 with a history of 3 consecutive normal Pap tests. Testing can be stopped between 65 and 70 with 3 consecutive normal Pap tests and no abnormal Pap or HPV tests in the past 10 years.   Fecal occult blood test (FOBT) of stool. / Every year beginning at age 50 and continuing until age 75. You may not need to do this test if you get a colonoscopy every 10 years.   Flexible sigmoidoscopy or colonoscopy.** / Every 5 years for a flexible sigmoidoscopy or every 10 years for a colonoscopy beginning at age 50 and continuing until age 75.   Hepatitis   C blood test.** / For all people born from 1945 through 1965 and any individual with known risks for hepatitis C.   Osteoporosis screening.** / A one-time screening for women ages 65 and over and women at risk for fractures or osteoporosis.   Skin self-exam. / Monthly.   Influenza immunization.** / Every year.   Pneumococcal polysaccharide immunization.** / 1 dose at age 65 (or older) if you have never been vaccinated.   Tetanus, diphtheria, pertussis (Tdap, Td) immunization. / A one-time dose of Tdap vaccine if you are over 65 and have contact with an infant, are a healthcare worker, or simply want to be protected from whooping cough. After that, you need a Td booster dose every 10 years.   Varicella immunization.** / Consult your caregiver.   Meningococcal immunization.** / Consult your caregiver.   Hepatitis A immunization.** / Consult your caregiver. 2 doses, 6 to 18 months apart.   Hepatitis B immunization.** / Check with your caregiver. 3 doses, usually over 6 months.  ** Family history and personal history of risk and conditions may change your caregiver's  recommendations. Document Released: 03/17/2001 Document Revised: 01/08/2011 Document Reviewed: 06/16/2010 [employer] Patient Information 2012 [employer]. 

## 2011-07-07 NOTE — Progress Notes (Signed)
Subjective:    Molly Klein is a 51 y.o. female who presents for Medicare Annual/Subsequent preventive examination.  Preventive Screening-Counseling & Management  Tobacco History  Smoking status  . Former Smoker  . Quit date: 02/03/1975  Smokeless tobacco  . Never Used     Problems Prior to Visit 1.   Current Problems (verified) Patient Active Problem List  Diagnoses  . HEPATITIS C  . MOLE  . SLEEP APNEA, OBSTRUCTIVE  . NARCOLEPSY W/O CATAPLEXY  . ASTHMA  . GERD  . OVERACTIVE BLADDER  . UTI  . FIBROCYSTIC BREAST DISEASE  . SHORTNESS OF BREATH  . DYSURIA  . STRESS INCONTINENCE  . PATELLAR DISLOCATION, RIGHT  . DVT, HX OF  . UNSPECIFIED ESSENTIAL HYPERTENSION  . Dog bite of extremity  . Nipple discharge in female, right  . Flank pain    Medications Prior to Visit Current Outpatient Prescriptions on File Prior to Visit  Medication Sig Dispense Refill  . albuterol (PROVENTIL) (2.5 MG/3ML) 0.083% nebulizer solution Take 2.5 mg by nebulization as directed.        Marland Kitchen esomeprazole (NEXIUM) 40 MG capsule Take 1 capsule (40 mg total) by mouth daily.  30 capsule  3  . gabapentin (NEURONTIN) 300 MG capsule Take 300 mg by mouth every evening.       . modafinil (PROVIGIL) 200 MG tablet Take 200 mg by mouth daily.        Marland Kitchen Respiratory Therapy Supplies (NEBULIZER COMPRESSOR) KIT by Does not apply route.        Marland Kitchen spironolactone (ALDACTONE) 25 MG tablet Take 25 mg by mouth daily.        Marland Kitchen DISCONTD: albuterol (PROVENTIL HFA) 108 (90 BASE) MCG/ACT inhaler Inhale 2 puffs into the lungs 4 (four) times daily.          Current Medications (verified) Current Outpatient Prescriptions  Medication Sig Dispense Refill  . albuterol (PROVENTIL HFA) 108 (90 BASE) MCG/ACT inhaler Inhale 2 puffs into the lungs 4 (four) times daily.  1 Inhaler  3  . albuterol (PROVENTIL) (2.5 MG/3ML) 0.083% nebulizer solution Take 2.5 mg by nebulization as directed.        Marland Kitchen esomeprazole (NEXIUM) 40 MG  capsule Take 1 capsule (40 mg total) by mouth daily.  30 capsule  3  . gabapentin (NEURONTIN) 300 MG capsule Take 300 mg by mouth every evening.       . modafinil (PROVIGIL) 200 MG tablet Take 200 mg by mouth daily.        Marland Kitchen Respiratory Therapy Supplies (NEBULIZER COMPRESSOR) KIT by Does not apply route.        Marland Kitchen spironolactone (ALDACTONE) 25 MG tablet Take 25 mg by mouth daily.        Marland Kitchen DISCONTD: albuterol (PROVENTIL HFA) 108 (90 BASE) MCG/ACT inhaler Inhale 2 puffs into the lungs 4 (four) times daily.           Allergies (verified) Codeine; Interferon beta-1a; and Sulfonamide derivatives   PAST HISTORY  Family History Family History  Problem Relation Age of Onset  . Alcohol abuse    . Breast cancer    . Coronary artery disease    . Diabetes    . Cancer      BREAST  . Cancer Sister     pt unaware of what kind  . Cancer Maternal Grandmother     breast  . Heart disease Mother     mi in 24s  . Alcohol abuse Mother  cirrhosis of liver  . Heart disease Father     MI in 74s    Social History History  Substance Use Topics  . Smoking status: Former Smoker    Quit date: 02/03/1975  . Smokeless tobacco: Never Used  . Alcohol Use: No     Are there smokers in your home (other than you)? No  Risk Factors Current exercise habits: Gym/ health club routine includes cardio.  Dietary issues discussed: na   Cardiac risk factors: family history of premature cardiovascular disease and obesity (BMI >= 30 kg/m2).  Depression Screen (Note: if answer to either of the following is "Yes", a more complete depression screening is indicated)   Over the past two weeks, have you felt down, depressed or hopeless? No  Over the past two weeks, have you felt little interest or pleasure in doing things? No  Have you lost interest or pleasure in daily life? No  Do you often feel hopeless? No  Do you cry easily over simple problems? No  Activities of Daily Living In your present state of  health, do you have any difficulty performing the following activities?:  Driving? No Managing money?  No Feeding yourself? No Getting from bed to chair? No Climbing a flight of stairs? No Preparing food and eating?: No Bathing or showering? No Getting dressed: No Getting to the toilet? No Using the toilet:No Moving around from place to place: No In the past year have you fallen or had a near fall?:No   Are you sexually active?  Yes  Do you have more than one partner?  No  Hearing Difficulties: No Do you often ask people to speak up or repeat themselves? No Do you experience ringing or noises in your ears? No Do you have difficulty understanding soft or whispered voices? No   Do you feel that you have a problem with memory? No  Do you often misplace items? No  Do you feel safe at home?  Yes  Cognitive Testing  Alert? Yes  Normal Appearance?Yes  Oriented to person? Yes  Place? Yes   Time? Yes  Recall of three objects?  Yes  Can perform simple calculations? Yes  Displays appropriate judgment?Yes  Can read the correct time from a watch face?Yes   Advanced Directives have been discussed with the patient? Yes  List the Names of Other Physician/Practitioners you currently use: 1.    Indicate any recent Medical Services you may have received from other than [facility] providers in the past year (date may be approximate).  Immunization History  Administered Date(s) Administered  . Td 10/01/2003    Screening Tests Health Maintenance  Topic Date Due  . Pap Smear  05/14/1976  . Colonoscopy  05/14/2008  . Influenza Vaccine  11/03/2011  . Mammogram  01/06/2013  . Tetanus/tdap  09/30/2013    All answers were reviewed with the patient and necessary referrals were made:  Loreen Freud, DO   07/07/2011   History reviewed: allergies, current medications, past family history, past medical history, past social history, past surgical history and problem list  Review of Systems Review of  Systems  Constitutional: Negative for activity change, appetite change and fatigue.  HENT: Negative for hearing loss, congestion, tinnitus and ear discharge.  dentist q44m Eyes: Negative for visual disturbance (see optho q1y -- vision corrected to 20/20 with glasses).  Respiratory: Negative for cough, chest tightness and shortness of breath.   Cardiovascular: Negative for chest pain, palpitations and leg swelling.  Gastrointestinal: Negative  for abdominal pain, diarrhea, constipation and abdominal distention.  Genitourinary: Negative for urgency, frequency, decreased urine volume and difficulty urinating.  Musculoskeletal: Negative for back pain, arthralgias and gait problem.  Skin: Negative for color change, pallor and rash.  Neurological: Negative for dizziness, light-headedness, numbness and headaches.  Hematological: Negative for adenopathy. Does not bruise/bleed easily.  Psychiatric/Behavioral: Negative for suicidal ideas, confusion, sleep disturbance, self-injury, dysphoric mood, decreased concentration and agitation.        Objective:     Vision by Snellen chart: optho  Body mass index is 40.21 kg/(m^2). BP 130/80  Pulse 83  Temp(Src) 98.3 F (36.8 C) (Oral)  Ht 5' 3.5" (1.613 m)  Wt 230 lb 9.6 oz (104.599 kg)  BMI 40.21 kg/m2  SpO2 97%  BP 130/80  Pulse 83  Temp(Src) 98.3 F (36.8 C) (Oral)  Ht 5' 3.5" (1.613 m)  Wt 230 lb 9.6 oz (104.599 kg)  BMI 40.21 kg/m2  SpO2 97% General appearance: alert, cooperative, appears stated age and no distress Head: Normocephalic, without obvious abnormality, atraumatic Eyes: conjunctivae/corneas clear. PERRL, EOM's intact. Fundi benign. Ears: normal TM's and external ear canals both ears Nose: Nares normal. Septum midline. Mucosa normal. No drainage or sinus tenderness. Throat: lips, mucosa, and tongue normal; teeth and gums normal Neck: no adenopathy, supple, symmetrical, trachea midline and thyroid not enlarged, symmetric, no  tenderness/mass/nodules Back: symmetric, no curvature. ROM normal. No CVA tenderness. Lungs: clear to auscultation bilaterally Breasts: normal appearance, no masses or tenderness Heart: regular rate and rhythm, S1, S2 normal, no murmur, click, rub or gallop Abdomen: soft, non-tender; bowel sounds normal; no masses,  no organomegaly Pelvic: cervix normal in appearance, external genitalia normal, no adnexal masses or tenderness, no cervical motion tenderness, rectovaginal septum normal, uterus normal size, shape, and consistency and vagina normal without discharge Extremities: extremities normal, atraumatic, no cyanosis or edema Pulses: 2+ and symmetric Skin: Skin color, texture, turgor normal. No rashes or lesions Lymph nodes: Cervical, supraclavicular, and axillary nodes normal. Neurologic: + paralysis L hand---no change Psych-- no depression or anxiety     Assessment:     cpe     Plan:     During the course of the visit the patient was educated and counseled about appropriate screening and preventive services including:    Screening mammography  Screening Pap smear and pelvic exam   Bone densitometry screening  Colorectal cancer screening  Nutrition counseling   Advanced directives: has an advanced directive - a copy HAS NOT been provided.  Diet review for nutrition referral? Yes ____  Not Indicated __x__   Patient Instructions (the written plan) was given to the patient.  Medicare Attestation I have personally reviewed: The patient's medical and social history Their use of alcohol, tobacco or illicit drugs Their current medications and supplements The patient's functional ability including ADLs,fall risks, home safety risks, cognitive, and hearing and visual impairment Diet and physical activities Evidence for depression or mood disorders  The patient's weight, height, BMI, and visual acuity have been recorded in the chart.  I have made referrals, counseling,  and provided education to the patient based on review of the above and I have provided the patient with a written personalized care plan for preventive services.     Loreen Freud, DO   07/07/2011

## 2011-08-13 ENCOUNTER — Other Ambulatory Visit: Payer: Self-pay | Admitting: Family Medicine

## 2011-08-13 MED ORDER — ALBUTEROL SULFATE (2.5 MG/3ML) 0.083% IN NEBU: 2.5000 mg | INHALATION_SOLUTION | RESPIRATORY_TRACT | Status: DC

## 2011-08-13 NOTE — Telephone Encounter (Signed)
patient does not have any Albuterol,but did get nebulizer, please call into [facility]  Patient call back [phone]

## 2011-08-18 ENCOUNTER — Ambulatory Visit (INDEPENDENT_AMBULATORY_CARE_PROVIDER_SITE_OTHER): Payer: Medicare HMO | Admitting: Family Medicine

## 2011-08-18 ENCOUNTER — Encounter: Payer: Self-pay | Admitting: Family Medicine

## 2011-08-18 VITALS — BP 120/84 | HR 80 | Temp 98.3°F | Wt 234.8 lb

## 2011-08-18 DIAGNOSIS — R21 Rash and other nonspecific skin eruption: Secondary | ICD-10-CM

## 2011-08-18 DIAGNOSIS — B029 Zoster without complications: Secondary | ICD-10-CM

## 2011-08-18 MED ORDER — VALACYCLOVIR HCL 1 G PO TABS: ORAL_TABLET | ORAL | Status: DC

## 2011-08-18 MED ORDER — PREDNISONE 10 MG PO TABS: ORAL_TABLET | ORAL | Status: DC

## 2011-08-18 MED ORDER — HYDROCODONE-ACETAMINOPHEN 5-500 MG PO TABS: 1.0000 | ORAL_TABLET | Freq: Three times a day (TID) | ORAL | Status: AC | PRN

## 2011-08-18 NOTE — Patient Instructions (Signed)

## 2011-08-18 NOTE — Progress Notes (Signed)
  Subjective:     Molly Klein is a 51 y.o. female who presents for evaluation of a rash involving the thigh. Rash started 5 days ago. Lesions are pink, and raised in texture. Rash has changed over time. Rash is painful. Associated symptoms: none. Patient denies: abdominal pain, arthralgia, congestion, cough, crankiness, decrease in appetite, decrease in energy level, fever, headache, irritability, myalgia, nausea, sore throat and vomiting. Patient has not had contacts with similar rash. Patient has not had new exposures (soaps, lotions, laundry detergents, foods, medications, plants, insects or animals).  Pt states she had rash first and then pain  The following portions of the patient's history were reviewed and updated as appropriate: allergies, current medications, past family history, past medical history, past social history, past surgical history and problem list.  Review of Systems Pertinent items are noted in HPI.    Objective:    BP 120/84  Pulse 80  Temp 98.3 F (36.8 C) (Oral)  Wt 234 lb 12.8 oz (106.505 kg)  SpO2 97% General:  alert, cooperative, appears stated age and mild distress  Skin:  vesicles noted on back of R thigh     Assessment:    rash  ? shingles ----story not consistant but it looks like it could be   Plan:    Medications: steroids: pred taper  and valtrex. Written patient instruction given. Follow up in a few days. --if needed vicodin for pain

## 2011-08-20 ENCOUNTER — Telehealth: Payer: Self-pay | Admitting: *Deleted

## 2011-08-20 NOTE — Telephone Encounter (Signed)
Approved under medicare drug plan from 07-29-11 until 08-18-14, approval letter scan to chart, pharmacy faxed.

## 2011-09-21 ENCOUNTER — Other Ambulatory Visit: Payer: Medicare HMO

## 2011-09-24 ENCOUNTER — Ambulatory Visit (INDEPENDENT_AMBULATORY_CARE_PROVIDER_SITE_OTHER)
Admission: RE | Admit: 2011-09-24 | Discharge: 2011-09-24 | Disposition: A | Discharge status: Home / Self Care | Payer: Medicare HMO | Source: Ambulatory Visit | Attending: Family Medicine | Admitting: Family Medicine

## 2011-09-24 DIAGNOSIS — R9389 Abnormal findings on diagnostic imaging of other specified body structures: Secondary | ICD-10-CM

## 2011-09-24 MED ORDER — IOHEXOL 300 MG/ML  SOLN
80.0000 mL | Freq: Once | INTRAMUSCULAR | Status: AC | PRN
  Administered 2011-09-24: 80 mL via INTRAVENOUS

## 2011-09-28 ENCOUNTER — Ambulatory Visit (INDEPENDENT_AMBULATORY_CARE_PROVIDER_SITE_OTHER): Payer: Medicare HMO | Admitting: Family Medicine

## 2011-09-28 ENCOUNTER — Encounter: Payer: Self-pay | Admitting: Family Medicine

## 2011-09-28 VITALS — BP 132/80 | HR 86 | Temp 97.8°F | Wt 235.2 lb

## 2011-09-28 DIAGNOSIS — R21 Rash and other nonspecific skin eruption: Secondary | ICD-10-CM

## 2011-09-28 NOTE — Addendum Note (Signed)
Addended by: Arnette Norris on: 09/28/2011 05:28 PM   Modules accepted: Orders

## 2011-09-28 NOTE — Progress Notes (Signed)
  Punch Biopsy Procedure Note  Pre-operative Diagnosis: rash  Post-operative Diagnosis: same  Locations:posterior thigh  Indications: nonhealing  Anesthesia: Lidocaine 1% without epinephrine without added sodium bicarbonate  Procedure Details  History of allergy to iodine: no Patient informed of the risks (including bleeding and infection) and benefits of the  procedure and Written informed consent obtained.  The lesion and surrounding area was given a sterile prep using alcohol and betadine and draped in the usual sterile fashion. The skin was then stretched perpendicular to the skin tension lines and the lesion removed using the 3mm punch. The resulting ellipse was then closed. The wound was closed with 3-0 ethilon using simple interrupted stitches. Antibiotic ointment and a sterile dressing applied. The specimen was sent for pathologic examination. The patient tolerated the procedure well.  EBL:1 ml  Findings: Irregular red rash post thigh Condition: Stable  Complications: none.  Plan: 1. Instructed to keep the wound dry and covered for 24-48h and clean thereafter. 2. Warning signs of infection were reviewed.   3. Recommended that the patient use OTC acetaminophen as needed for pain.  4. Return for suture removal in 7 days.

## 2011-09-28 NOTE — Patient Instructions (Addendum)
Biopsy  Care After  Refer to this sheet in the next few weeks. These instructions provide you with information on caring for yourself after your procedure. Your caregiver may also give you more specific instructions. Your treatment has been planned according to current medical practices, but problems sometimes occur. Call your caregiver if you have any problems or questions after your procedure.  If you had a fine needle biopsy, you may have soreness at the biopsy site for 1 to 2 days. If you had an open biopsy, you may have soreness at the biopsy site for 3 to 4 days.  HOME CARE INSTRUCTIONS    You may resume normal diet and activities as directed.   Change bandages (dressings) as directed. If your wound was closed with a skin glue (adhesive), it will wear off and begin to peel in 7 days.   Only take over-the-counter or prescription medicines for pain, discomfort, or fever as directed by your caregiver.   Ask your caregiver when you can bathe and get your wound wet.  SEEK IMMEDIATE MEDICAL CARE IF:    You have increased bleeding (more than a small spot) from the biopsy site.   You notice redness, swelling, or increasing pain at the biopsy site.   You have pus coming from the biopsy site.   You have a fever.   You notice a bad smell coming from the biopsy site or dressing.   You have a rash, have difficulty breathing, or have any allergic problems.  MAKE SURE YOU:    Understand these instructions.   Will watch your condition.   Will get help right away if you are not doing well or get worse.  Document Released: 08/08/2004 Document Revised: 01/08/2011 Document Reviewed: 07/17/2010  ExitCare Patient Information 2012 [employer]

## 2011-10-02 ENCOUNTER — Other Ambulatory Visit: Payer: Self-pay | Admitting: Family Medicine

## 2011-10-02 ENCOUNTER — Telehealth: Payer: Self-pay

## 2011-10-02 DIAGNOSIS — L570 Actinic keratosis: Secondary | ICD-10-CM

## 2011-10-02 DIAGNOSIS — R911 Solitary pulmonary nodule: Secondary | ICD-10-CM

## 2011-10-02 DIAGNOSIS — L821 Other seborrheic keratosis: Secondary | ICD-10-CM

## 2011-10-02 DIAGNOSIS — R9389 Abnormal findings on diagnostic imaging of other specified body structures: Secondary | ICD-10-CM

## 2011-10-02 NOTE — Telephone Encounter (Signed)
Message copied by Arnette Norris on Fri Oct 02, 2011  5:07 PM ------      Message from: Lelon Perla      Created: Fri Oct 02, 2011  4:16 PM       Lichenoid keratosis----- benign but we should refer her to derm for further evaluation and treatment

## 2011-10-02 NOTE — Telephone Encounter (Signed)
Discussed with patient and she voice understanding. Referral put into the system.    KP

## 2011-10-06 ENCOUNTER — Encounter: Payer: Self-pay | Admitting: Family Medicine

## 2011-10-06 ENCOUNTER — Ambulatory Visit (INDEPENDENT_AMBULATORY_CARE_PROVIDER_SITE_OTHER): Payer: Medicare HMO | Admitting: Family Medicine

## 2011-10-06 VITALS — BP 130/80 | HR 93 | Temp 98.3°F | Wt 239.8 lb

## 2011-10-06 DIAGNOSIS — L570 Actinic keratosis: Secondary | ICD-10-CM

## 2011-10-06 DIAGNOSIS — L851 Acquired keratosis [keratoderma] palmaris et plantaris: Secondary | ICD-10-CM

## 2011-10-06 DIAGNOSIS — L821 Other seborrheic keratosis: Secondary | ICD-10-CM

## 2011-10-06 MED ORDER — MOMETASONE FUROATE 0.1 % EX CREA: TOPICAL_CREAM | Freq: Every day | CUTANEOUS | Status: DC

## 2011-10-06 NOTE — Assessment & Plan Note (Addendum)
[facility] referral pending 1 stitch removed

## 2011-10-06 NOTE — Progress Notes (Signed)
  Subjective:    Patient ID: Molly Klein, female    DOB: August 03, 1960, 51 y.o.   MRN: [id number]  HPI Pt here for removal of stitch.    Review of Systems     Objective:   Physical Exam        Assessment & Plan:

## 2011-10-06 NOTE — Patient Instructions (Addendum)
Derm referral pending rto prn

## 2011-10-07 ENCOUNTER — Ambulatory Visit (INDEPENDENT_AMBULATORY_CARE_PROVIDER_SITE_OTHER): Payer: Medicare HMO | Admitting: Pulmonary Disease

## 2011-10-07 ENCOUNTER — Encounter: Payer: Self-pay | Admitting: Pulmonary Disease

## 2011-10-07 VITALS — BP 124/80 | HR 84 | Temp 97.9°F | Ht 63.0 in | Wt 238.8 lb

## 2011-10-07 DIAGNOSIS — IMO0001 Reserved for inherently not codable concepts without codable children: Secondary | ICD-10-CM

## 2011-10-07 DIAGNOSIS — R059 Cough, unspecified: Secondary | ICD-10-CM

## 2011-10-07 DIAGNOSIS — R911 Solitary pulmonary nodule: Secondary | ICD-10-CM | POA: Insufficient documentation

## 2011-10-07 DIAGNOSIS — L851 Acquired keratosis [keratoderma] palmaris et plantaris: Secondary | ICD-10-CM

## 2011-10-07 DIAGNOSIS — J45909 Unspecified asthma, uncomplicated: Secondary | ICD-10-CM

## 2011-10-07 DIAGNOSIS — L570 Actinic keratosis: Secondary | ICD-10-CM

## 2011-10-07 DIAGNOSIS — K219 Gastro-esophageal reflux disease without esophagitis: Secondary | ICD-10-CM

## 2011-10-07 DIAGNOSIS — R05 Cough: Secondary | ICD-10-CM

## 2011-10-07 DIAGNOSIS — L821 Other seborrheic keratosis: Secondary | ICD-10-CM

## 2011-10-07 DIAGNOSIS — R9389 Abnormal findings on diagnostic imaging of other specified body structures: Secondary | ICD-10-CM

## 2011-10-07 MED ORDER — BUDESONIDE-FORMOTEROL FUMARATE 160-4.5 MCG/ACT IN AERO: 2.0000 | INHALATION_SPRAY | Freq: Two times a day (BID) | RESPIRATORY_TRACT | Status: DC

## 2011-10-07 MED ORDER — DOXYCYCLINE HYCLATE 100 MG PO TABS: 100.0000 mg | ORAL_TABLET | Freq: Two times a day (BID) | ORAL | Status: AC

## 2011-10-07 NOTE — Assessment & Plan Note (Signed)
She has more trouble with cough, wheeze, and sputum.  Some of this could be attributed to her reflux and upper airway irritation.  This could also be contributing to worsening asthma.  I have given her sample of symbicort.  She is to continue albuterol as needed.  Will re-assess at follow up visit in 2 weeks.

## 2011-10-07 NOTE — Assessment & Plan Note (Signed)
Explained that it is less likely her rash is contributing to findings on CT chest.

## 2011-10-07 NOTE — Progress Notes (Deleted)
  Subjective:    Patient ID: Molly Klein, female    DOB: [birth year], 51 y.o.   MRN: [id number]  HPI    Review of Systems  Constitutional: Positive for unexpected weight change. Negative for fever and appetite change.  HENT: Negative for ear pain, congestion, sore throat, trouble swallowing and dental problem.   Respiratory: Positive for cough and shortness of breath.   Cardiovascular: Negative for chest pain, palpitations and leg swelling.  Gastrointestinal: Negative for abdominal pain.  Musculoskeletal: Negative for joint swelling.  Skin: Positive for rash.  Neurological: Negative for headaches.  Psychiatric/Behavioral: Negative for dysphoric mood. The patient is not nervous/anxious.        Objective:   Physical Exam        Assessment & Plan:

## 2011-10-07 NOTE — Assessment & Plan Note (Signed)
She is to continue nexium.  Depending on results of esophagram she may need additional GI evaluation.

## 2011-10-07 NOTE — Patient Instructions (Signed)
Will schedule xray for esophagus Symbicort two puffs twice per day, and rinse mouth after each use Albuterol two puffs as needed Doxycycline 100 mg pill>>1 pill twice per day for 10 days Follow up in 2 weeks

## 2011-10-07 NOTE — Assessment & Plan Note (Signed)
I have reviewed her CT findings from May and August 2013.  These lesions appear to be inflammatory/infectious.  Likelihood of malignancy is much less.  I am concerned she could be having reflux and silent aspiration contributing to her CT chest findings.  Will give course of doxycycline to treat possible infection.  Will also arrange for esophagram to further assess reflux.

## 2011-10-07 NOTE — Progress Notes (Signed)
Chief Complaint  Patient presents with  . Advice Only    refer Dr. Laury Axon. abnormal CT scan.    History of Present Illness: Molly Klein is a 51 y.o. female for evaluation of abnormal CT chest.  She developed flank pain in May, and was assessed by PCP.  She had CT abd then, and images of lungs showed Rt lower lobe ground glass nodule 8 mm.  She was given course of avelox, and advised to have follow up CT chest in 6 months.  This was done in August, and showed faint nodular opacity at Rt lower lobe and esophageal thickening.  As a result she was referred to pulmonary.  She has history of asthma and reflux.  She has noticed more trouble with her reflux.  She also has more cough with chest congestion and sputum production. She notices more trouble with her breathing.  She was found to have a rash on her leg.  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  She is scheduled for evaluation with dermatology.  She is from [place], but has been living in [place] for several years.  She is a retired Corporate treasurer.  She has a Development worker, international aid.  She went on a trip to [place] several weeks ago.  There is no history of pneumonia or exposure to tuberculosis.  She has minimal, remote history of smoking.  Past Medical History  Diagnosis Date  . Asthma   . GERD (gastroesophageal reflux disease)   . Hepatitis C   . DVT (deep venous thrombosis)   . Nipple discharge     right breast  . Neuromuscular disorder     neuropathy lt arm  . OSA (obstructive sleep apnea)     Past Surgical History  Procedure Date  . Cholecystectomy   . Pilonidal cyst excision   . Dilation and curettage of uterus   . Tonsillectomy   . Abdominal hysterectomy   . Fracture surgery     fx lt arm  . Breast biopsy 02/06/2011    Procedure: BREAST BIOPSY;  Surgeon: Velora Heckler, MD;  Location: [facility];  Service: General;  Laterality: Right;  right breast biopsy    Current Outpatient  Prescriptions on File Prior to Visit  Medication Sig Dispense Refill  . albuterol (PROVENTIL HFA) 108 (90 BASE) MCG/ACT inhaler Inhale 2 puffs into the lungs 4 (four) times daily.  1 Inhaler  3  . albuterol (PROVENTIL) (2.5 MG/3ML) 0.083% nebulizer solution Take 3 mLs (2.5 mg total) by nebulization as directed.  75 mL  1  . esomeprazole (NEXIUM) 40 MG capsule Take 1 capsule (40 mg total) by mouth daily.  30 capsule  3  . gabapentin (NEURONTIN) 300 MG capsule Take 300 mg by mouth every evening.       . modafinil (PROVIGIL) 200 MG tablet Take 200 mg by mouth daily.        . mometasone (ELOCON) 0.1 % cream Apply topically daily.  45 g  0  . predniSONE (DELTASONE) 10 MG tablet 3 po qd for 3 days then 2 po qd for 3 days the 1 po qd for 3 days  18 tablet  0  . Respiratory Therapy Supplies (NEBULIZER COMPRESSOR) KIT by Does not apply route.        Marland Kitchen spironolactone (ALDACTONE) 25 MG tablet Take 25 mg by mouth daily.        . valACYclovir (VALTREX) 1000 MG tablet 1 po tid for 10  days  30 tablet  0    Allergies  Allergen Reactions  . Codeine Nausea And Vomiting  . Interferon Beta-1a   . Sulfonamide Derivatives Nausea Only    Family History  Problem Relation Age of Onset  . Rectal cancer Maternal Grandmother   . Cancer Sister     pt unaware of what kind  . Cancer Maternal Grandmother     breast  . Heart attack Mother     mi in 42s  . Alcohol abuse Mother     cirrhosis of liver  . Heart attack Father     MI in 52s    History  Substance Use Topics  . Smoking status: Former Smoker -- 5 years    Types: Cigarettes    Quit date: 02/03/1975  . Smokeless tobacco: Never Used   Comment: 1 pack per week  . Alcohol Use: No    Review of Systems  Constitutional: Positive for unexpected weight change. Negative for fever and appetite change.  HENT: Negative for ear pain, congestion, sore throat, trouble swallowing and dental problem.   Respiratory: Positive for cough and shortness of breath.     Cardiovascular: Negative for chest pain, palpitations and leg swelling.  Gastrointestinal: Negative for abdominal pain.  Musculoskeletal: Negative for joint swelling.  Skin: Positive for rash.  Neurological: Negative for headaches.  Psychiatric/Behavioral: Negative for dysphoric mood. The patient is not nervous/anxious.    Physical Exam: Filed Vitals:   10/07/11 1452  BP: 124/80  Pulse: 84  Temp: 97.9 F (36.6 C)  TempSrc: Oral  Height: 5\' 3"  (1.6 m)  Weight: 238 lb 12.8 oz (108.319 kg)  SpO2: 94%  ,  Current Encounter SPO2  10/07/11 1452 94%  10/06/11 0900 99%  09/28/11 1416 97%    Wt Readings from Last 3 Encounters:  10/07/11 238 lb 12.8 oz (108.319 kg)  10/06/11 239 lb 12.8 oz (108.773 kg)  09/28/11 235 lb 3.2 oz (106.686 kg)    Body mass index is 42.30 kg/(m^2).   General - No distress ENT - TM clear, no sinus tenderness, clear nasal drainage, no oral exudate, no LAN, no thyromegaly Cardiac - s1s2 regular, no murmur, pulses symmetric, no edema Chest - normal respiratory excursion, good air entry, coarse breath sounds, no wheeze/rales/dullness Back - no focal tenderness Abd - soft, non-tender, no organomegaly, + bowel sounds Ext - normal motor strength Neuro - Cranial nerves are normal. PERLA. EOM's intact. Skin - gray, slightly raised rash on right thigh Psych - normal mood, and behavior.  CT ABDOMEN AND PELVIS WITHOUT CONTRAST 06/21/11 Technique: Multidetector CT imaging of the abdomen and pelvis was performed following the standard protocol without intravenous contrast.  Comparison: No priors.  Findings:  Lung Bases: Small region of ill-defined ground-glass attenuation with a pleural based 8 mm nodule in the posterobasal aspect of the right lower lobe. There is a small hiatal hernia.  Abdomen/Pelvis: There are no abnormal calcifications within the collecting system of either kidney, along the course of either ureter, or within the lumen of the urinary  bladder. No abnormal hydroureteronephrosis or perinephric stranding at this time. On these noncontrast images, there are no definite focal renal lesions Identified.  The unenhanced appearance of the liver, pancreas, spleen, and bilateral adrenal glands is unremarkable. The appendix is normal.  There is no ascites or pneumoperitoneum and no pathologic distension of bowel. No definite pathologic adenopathy identified within the abdomen or pelvis on this noncontrast CT examination.  The patient is status post  hysterectomy. The right ovary is not confidently identified and may be surgically absent. The left ovary is unremarkable in appearance. Urinary bladder is unremarkable in appearance.  Musculoskeletal: There are no aggressive appearing lytic or blastic lesions noted in the visualized portions of the skeleton.  IMPRESSION: 1. No findings within the abdomen or pelvis to account the patient's history. Specifically, no abnormal urinary tract calculi.  2. Region of ill-defined ground-glass attenuation with ill-defined pleural based 8 mm nodule in the posterobasal segment of the right lower lobe. Overall appearance is strongly favored to be of infectious or inflammatory etiology, potentially from mild aspiration. However, if the patient is at high risk for bronchogenic carcinoma, follow-up chest CT at 3-6 months is recommended. If the patient is at low risk for bronchogenic carcinoma, follow-up chest CT at 6-12 months is recommended. This recommendation follows the consensus statement: Guidelines for Management of Small Pulmonary Nodules Detected on CT Scans: A Statement from the Fleischner Society as published in Radiology 2005; 237:395-400. 3. Status post hysterectomy and cholecystectomy. 4. Small hiatal hernia. Original Report Authenticated By: Florencia Reasons, M.D.  Ct Chest W Contrast  09/24/2011  *RADIOLOGY REPORT*  Clinical Data: Follow up of abnormal CT.  Ground-glass nodule.  CT CHEST WITH CONTRAST   Technique:  Multidetector CT imaging of the chest was performed following the standard protocol during bolus administration of intravenous contrast.  Contrast: 80mL OMNIPAQUE IOHEXOL 300 MG/ML  SOLN  Comparison: Abdominal CT of 06/18/2011.  Chest two of 06/04/2004.  Findings: Lung windows demonstrate minimal reticular nodular opacity at the right lung base.  Resolution of the ground-glass opacity at the right costophrenic angle since 06/18/2011.  Clear left lung.  Soft tissue windows demonstrate normal heart size without pericardial or pleural effusion.  No mediastinal or hilar adenopathy.  Question distal esophageal wall thickening on image 40.  Limited abdominal imaging demonstrates no significant findings. Cholecystectomy clips.  Remote lateral right fifth rib trauma.  IMPRESSION:  1.  Resolution of previously described right lung base ground-glass nodule. 2.  Minimal reticular nodular opacity at the right lung base, possibly increased or new since 06/18/2011. Cannot exclude interval infection. 3.  Suspicion of distal esophageal wall thickening.  Correlate with symptoms of esophagitis.   Original Report Authenticated By: Consuello Bossier, M.D.     Lab Results  Component Value Date   WBC 4.1* 07/07/2011   HGB 14.9 07/07/2011   HCT 43.6 07/07/2011   MCV 89.8 07/07/2011   PLT 165.0 07/07/2011    Lab Results  Component Value Date   CREATININE 0.7 07/07/2011   BUN 11 07/07/2011   NA 143 07/07/2011   K 3.5 07/07/2011   CL 105 07/07/2011   CO2 29 07/07/2011    Lab Results  Component Value Date   ALT 22 07/07/2011   AST 22 07/07/2011   ALKPHOS 94 07/07/2011   BILITOT 0.5 07/07/2011    Lab Results  Component Value Date   TSH 1.64 07/07/2011   Assessment/Plan:  Coralyn Helling, MD [facility]/Sleep Pager:  [phone] 10/07/2011, 2:54 PM

## 2011-10-09 ENCOUNTER — Encounter (HOSPITAL_COMMUNITY): Payer: Self-pay

## 2011-10-09 ENCOUNTER — Ambulatory Visit (HOSPITAL_COMMUNITY)
Admission: RE | Admit: 2011-10-09 | Discharge: 2011-10-09 | Disposition: A | Discharge status: Home / Self Care | Payer: Medicare HMO | Source: Ambulatory Visit | Attending: Pulmonary Disease | Admitting: Pulmonary Disease

## 2011-10-09 DIAGNOSIS — R05 Cough: Secondary | ICD-10-CM

## 2011-10-09 DIAGNOSIS — IMO0001 Reserved for inherently not codable concepts without codable children: Secondary | ICD-10-CM

## 2011-10-09 DIAGNOSIS — J45909 Unspecified asthma, uncomplicated: Secondary | ICD-10-CM | POA: Insufficient documentation

## 2011-10-09 DIAGNOSIS — R059 Cough, unspecified: Secondary | ICD-10-CM

## 2011-10-09 DIAGNOSIS — K449 Diaphragmatic hernia without obstruction or gangrene: Secondary | ICD-10-CM | POA: Insufficient documentation

## 2011-10-09 DIAGNOSIS — R1011 Right upper quadrant pain: Secondary | ICD-10-CM | POA: Insufficient documentation

## 2011-10-13 ENCOUNTER — Telehealth: Payer: Self-pay | Admitting: Pulmonary Disease

## 2011-10-13 NOTE — Telephone Encounter (Signed)
I spoke with patient about results and she verbalized understanding and had no questions 

## 2011-10-13 NOTE — Telephone Encounter (Signed)
Dg Esophagus  10/09/2011  *RADIOLOGY REPORT*  Clinical Data: Left upper quadrant pain.  ESOPHOGRAM/BARIUM SWALLOW  Technique:  Combined double contrast and single contrast examination performed using effervescent crystals, thick barium liquid, and thin barium liquid.  Fluoroscopy time:  1.1 minutes.  Comparison:  CT scan dated 09/24/2011  Findings:  The oral pharyngeal swallowing mechanisms are normal. No evidence of laryngeal penetration or aspiration.  The mucosa and motility of the esophagus are normal.  There is a small sliding hiatal hernia with no demonstrable reflux.  13 mm barium tablet passed immediately from the mouth to the stomach with no delay.  There is no edema of the distal esophageal mucosa.  IMPRESSION: Small sliding hiatal hernia.  Otherwise normal barium esophagram. No evidence of aspiration or laryngeal penetration.   Original Report Authenticated By: Gwynn Burly, M.D.     Will have my nurse inform pt that esophagus xray was normal.  No change to current tx plan.  Will discuss in more detail at [facility] on 10/22/11.

## 2011-10-22 ENCOUNTER — Encounter: Payer: Self-pay | Admitting: Pulmonary Disease

## 2011-10-22 ENCOUNTER — Ambulatory Visit (INDEPENDENT_AMBULATORY_CARE_PROVIDER_SITE_OTHER): Payer: Medicare HMO | Admitting: Pulmonary Disease

## 2011-10-22 VITALS — BP 122/88 | HR 75 | Temp 97.7°F | Ht 63.0 in | Wt 237.2 lb

## 2011-10-22 DIAGNOSIS — J45909 Unspecified asthma, uncomplicated: Secondary | ICD-10-CM

## 2011-10-22 DIAGNOSIS — G47419 Narcolepsy without cataplexy: Secondary | ICD-10-CM

## 2011-10-22 DIAGNOSIS — Z23 Encounter for immunization: Secondary | ICD-10-CM

## 2011-10-22 DIAGNOSIS — R9389 Abnormal findings on diagnostic imaging of other specified body structures: Secondary | ICD-10-CM

## 2011-10-22 DIAGNOSIS — K219 Gastro-esophageal reflux disease without esophagitis: Secondary | ICD-10-CM

## 2011-10-22 DIAGNOSIS — G4733 Obstructive sleep apnea (adult) (pediatric): Secondary | ICD-10-CM

## 2011-10-22 MED ORDER — BUDESONIDE-FORMOTEROL FUMARATE 160-4.5 MCG/ACT IN AERO: 2.0000 | INHALATION_SPRAY | Freq: Two times a day (BID) | RESPIRATORY_TRACT | Status: DC

## 2011-10-22 NOTE — Assessment & Plan Note (Signed)
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  Will need to further assess at next visit.

## 2011-10-22 NOTE — Assessment & Plan Note (Signed)
Improved.  Will continues symbicort and prn albuterol.  Will arrange for pulmonary function testing to further assess.  She got her flu shot today.

## 2011-10-22 NOTE — Assessment & Plan Note (Signed)
Her esophagram was unremarkable.  She has improved clinically.  Continue nexium per PCP.

## 2011-10-22 NOTE — Assessment & Plan Note (Signed)
Likely inflammatory/infectious changes. She has improved clinically with antibiotics and inhaler therapy.  Of note is that she was on prednisone for her lichenoid keratosis, and prednisone therapy may have helped her respiratory problems also.    Will continue current therapy, and then decide if/when to f/u chest imaging studies.

## 2011-10-22 NOTE — Patient Instructions (Addendum)
Flu shot today Will schedule breathing test (PFT) Follow up in 2 months

## 2011-10-22 NOTE — Assessment & Plan Note (Signed)
She would like to have this further assessed at next visit.

## 2011-10-22 NOTE — Progress Notes (Signed)
Chief Complaint  Patient presents with  . Follow-up    pt states no change in breathing since last OV. pt wants to discuss flu vaccine--hx of severe reaction(flu-like symptoms)    History of Present Illness: Molly Klein is a 51 y.o. female former smoker with asthma, and pulmonary nodule.  She also reports hx of OSA and narcolepsy.  Her breathing is better with symbicort.  She is not using albuterol as much.  She feels that antibiotic therapy has helped her breathing and cough.  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   Past Medical History  Diagnosis Date  . Asthma   . GERD (gastroesophageal reflux disease)   . Hepatitis C   . DVT (deep venous thrombosis)   . Nipple discharge     right breast  . Neuromuscular disorder     neuropathy lt arm  . OSA (obstructive sleep apnea)     Past Surgical History  Procedure Date  . Cholecystectomy   . Pilonidal cyst excision   . Dilation and curettage of uterus   . Tonsillectomy   . Abdominal hysterectomy   . Fracture surgery     fx lt arm  . Breast biopsy 02/06/2011    Procedure: BREAST BIOPSY;  Surgeon: Velora Heckler, MD;  Location: [facility];  Service: General;  Laterality: Right;  right breast biopsy    Outpatient Encounter Prescriptions as of 10/22/2011  Medication Sig Dispense Refill  . albuterol (PROVENTIL HFA;VENTOLIN HFA) 108 (90 BASE) MCG/ACT inhaler Inhale 2 puffs into the lungs 4 (four) times daily as needed.      Marland Kitchen albuterol (PROVENTIL) (2.5 MG/3ML) 0.083% nebulizer solution Take 3 mLs (2.5 mg total) by nebulization as directed.  75 mL  1  . budesonide-formoterol (SYMBICORT) 160-4.5 MCG/ACT inhaler Inhale 2 puffs into the lungs 2 (two) times daily.  1 Inhaler  6  . esomeprazole (NEXIUM) 40 MG capsule Take 1 capsule (40 mg total) by mouth daily.  30 capsule  3  . gabapentin (NEURONTIN) 300 MG capsule Take 300 mg by mouth every evening.       . modafinil (PROVIGIL) 200 MG tablet Take 200 mg  by mouth daily.        . mometasone (ELOCON) 0.1 % cream Apply topically daily.  45 g  0  . Respiratory Therapy Supplies (NEBULIZER COMPRESSOR) KIT by Does not apply route.        Marland Kitchen spironolactone (ALDACTONE) 25 MG tablet Take 25 mg by mouth daily.        . valACYclovir (VALTREX) 1000 MG tablet 1 po tid for 10 days  30 tablet  0  . DISCONTD: predniSONE (DELTASONE) 10 MG tablet 3 po qd for 3 days then 2 po qd for 3 days the 1 po qd for 3 days  18 tablet  0    Allergies  Allergen Reactions  . Codeine Nausea And Vomiting  . Interferon Beta-1a   . Sulfonamide Derivatives Nausea Only    Physical Exam:  Filed Vitals:   10/22/11 1635  BP: 122/88  Pulse: 75  Temp: 97.7 F (36.5 C)  TempSrc: Oral  Height: 5\' 3"  (1.6 m)  Weight: 237 lb 3.2 oz (107.593 kg)  SpO2: 97%    Current Encounter SPO2  10/22/11 1635 97%  10/07/11 1452 94%  10/06/11 0900 99%    Body mass index is 42.02 kg/(m^2). Wt Readings from Last 2 Encounters:  10/22/11 237 lb 3.2 oz (107.593 kg)  10/07/11 238 lb 12.8 oz (108.319 kg)    General - No distress  ENT - No sinus tenderness, clear nasal drainage, no oral exudate, no LAN Cardiac - s1s2 regular, no murmur, pulses symmetric, no edema  Chest - normal respiratory excursion, good air entry, no wheeze/rales/dullness  Back - no focal tenderness  Abd - soft, non-tender Ext - normal motor strength  Neuro - Cranial nerves are normal.  Skin - gray, slightly raised rash on right thigh  Psych - normal mood, and behavior.  Dg Esophagus  10/09/2011  *RADIOLOGY REPORT*  Clinical Data: Left upper quadrant pain.  ESOPHOGRAM/BARIUM SWALLOW  Technique:  Combined double contrast and single contrast examination performed using effervescent crystals, thick barium liquid, and thin barium liquid.  Fluoroscopy time:  1.1 minutes.  Comparison:  CT scan dated 09/24/2011  Findings:  The oral pharyngeal swallowing mechanisms are normal. No evidence of laryngeal penetration or  aspiration.  The mucosa and motility of the esophagus are normal.  There is a small sliding hiatal hernia with no demonstrable reflux.  13 mm barium tablet passed immediately from the mouth to the stomach with no delay.  There is no edema of the distal esophageal mucosa.  IMPRESSION: Small sliding hiatal hernia.  Otherwise normal barium esophagram. No evidence of aspiration or laryngeal penetration.   Original Report Authenticated By: Gwynn Burly, M.D.     Assessment/Plan:  Coralyn Helling, MD [place] [facility]/Critical [facility]/Sleep Pager:  [phone] 10/22/2011, 4:44 PM

## 2011-11-04 ENCOUNTER — Encounter: Payer: Self-pay | Admitting: Obstetrics and Gynecology

## 2011-11-04 ENCOUNTER — Telehealth: Payer: Self-pay | Admitting: Obstetrics and Gynecology

## 2011-11-04 ENCOUNTER — Ambulatory Visit (INDEPENDENT_AMBULATORY_CARE_PROVIDER_SITE_OTHER): Payer: Medicare HMO | Admitting: Obstetrics and Gynecology

## 2011-11-04 VITALS — BP 122/82 | Ht 63.0 in | Wt 238.0 lb

## 2011-11-04 DIAGNOSIS — Z202 Contact with and (suspected) exposure to infections with a predominantly sexual mode of transmission: Secondary | ICD-10-CM

## 2011-11-04 DIAGNOSIS — N898 Other specified noninflammatory disorders of vagina: Secondary | ICD-10-CM

## 2011-11-04 DIAGNOSIS — Z2089 Contact with and (suspected) exposure to other communicable diseases: Secondary | ICD-10-CM

## 2011-11-04 LAB — POCT WET PREP (WET MOUNT): Trichomonas Wet Prep HPF POC: POSITIVE

## 2011-11-04 MED ORDER — METRONIDAZOLE 500 MG PO TABS: 500.0000 mg | ORAL_TABLET | Freq: Two times a day (BID) | ORAL | Status: DC

## 2011-11-04 NOTE — Telephone Encounter (Signed)
Spoke with pt rgd msg. Pt stated been having itching,odor and discharge for 2 weeks. Made app with AVS today @ 3:30pm . Pt's voice understanding. bt cma

## 2011-11-04 NOTE — Progress Notes (Signed)
HISTORY OF PRESENT ILLNESS  [name] is a 51 y.o. year old female,No obstetric history on file., who presents for a problem visit. She complains of a vaginal discharge with itching.  She has concerns about her husband and STDs.  The patient has a history of hepatitis C. The patient has had a hysterectomy.  Subjective:  Vaginal discharge  Objective:  BP 122/82  Ht 5\' 3"  (1.6 m)  Wt 238 lb (107.956 kg)  BMI 42.16 kg/m2   abdomen soft and nontender  External genitalia: normal general appearance Vaginal: normal without tenderness, induration or masses, atrophic mucosa and relaxation noted Cervix: absent Adnexa: normal bimanual exam Uterus: absent  Wet prep: trichomoniasis  Assessment:  Trichomoniasis Rule out sexual transmitted infections  Plan:  GC and Chlamydia sent from urine Metronidazole 500 mg twice each day for 7 days  Return to office in 1 month(s) for annual exam and test of cure.   Leonard Schwartz M.D.  11/04/2011 4:05 PM    Odor: yes Fever: no Pelvic Pain: no  Itching: yes Dyspareunia: no Desires GC/CT: yes  Thin: yes History of PID: no Desires HIV,RPR,HbsAG: yes  Thick: no History of STD: yes Other: pt states during intercourse b/c of dryness her and her spouse used "[employer]" baby oil gel lavender scented.  Pt states her spouse has been coming home 3:00am or not coming home at all.

## 2011-11-04 NOTE — Patient Instructions (Signed)
Trichomoniasis Trichomoniasis is an infection, caused by the Trichomonas organism, that affects both women and men. In women, the outer female genitalia and the vagina are affected. In men, the penis is mainly affected, but the prostate and other reproductive organs can also be involved. Trichomoniasis is a sexually transmitted disease (STD) and is most often passed to another person through sexual contact. The majority of people who get trichomoniasis do so from a sexual encounter and are also at risk for other STDs. CAUSES   Sexual intercourse with an infected partner.  It can be present in swimming pools or hot tubs. SYMPTOMS   Abnormal gray-green frothy vaginal discharge in women.  Vaginal itching and irritation in women.  Itching and irritation of the area outside the vagina in women.  Penile discharge with or without pain in males.  Inflammation of the urethra (urethritis), causing painful urination.  Bleeding after sexual intercourse. RELATED COMPLICATIONS  Pelvic inflammatory disease.  Infection of the uterus (endometritis).  Infertility.  Tubal (ectopic) pregnancy.  It can be associated with other STDs, including gonorrhea and chlamydia, hepatitis B, and HIV. COMPLICATIONS DURING PREGNANCY  Early (premature) delivery.  Premature rupture of the membranes (PROM).  Low birth weight. DIAGNOSIS   Visualization of Trichomonas under the microscope from the vagina discharge.  Ph of the vagina greater than 4.5, tested with a test tape.  Trich Rapid Test.  Culture of the organism, but this is not usually needed.  It may be found on a Pap test.  Having a "strawberry cervix,"which means the cervix looks very red like a strawberry. TREATMENT   You may be given medication to fight the infection. Inform your caregiver if you could be or are pregnant. Some medications used to treat the infection should not be taken during pregnancy.  Over-the-counter medications or  creams to decrease itching or irritation may be recommended.  Your sexual partner will need to be treated if infected. HOME CARE INSTRUCTIONS   Take all medication prescribed by your caregiver.  Take over-the-counter medication for itching or irritation as directed by your caregiver.  Do not have sexual intercourse while you have the infection.  Do not douche or wear tampons.  Discuss your infection with your partner, as your partner may have acquired the infection from you. Or, your partner may have been the person who transmitted the infection to you.  Have your sex partner examined and treated if necessary.  Practice safe, informed, and protected sex.  See your caregiver for other STD testing. SEEK MEDICAL CARE IF:   You still have symptoms after you finish the medication.  You have an oral temperature above 102 F (38.9 C).  You develop belly (abdominal) pain.  You have pain when you urinate.  You have bleeding after sexual intercourse.  You develop a rash.  The medication makes you sick or makes you throw up (vomit). Document Released: 07/15/2000 Document Revised: 04/13/2011 Document Reviewed: 08/10/2008 Exit[employer] Patient Information 2013 [employer].  

## 2011-11-05 NOTE — Progress Notes (Signed)
Quick Note:  Send a letter that her labs are normal. Include a copy of the labs with the letter. ______ 

## 2011-12-01 ENCOUNTER — Encounter: Payer: Self-pay | Admitting: Obstetrics and Gynecology

## 2011-12-01 ENCOUNTER — Ambulatory Visit (INDEPENDENT_AMBULATORY_CARE_PROVIDER_SITE_OTHER): Payer: Medicare HMO | Admitting: Obstetrics and Gynecology

## 2011-12-01 VITALS — BP 112/64 | Ht 63.0 in | Wt 235.0 lb

## 2011-12-01 DIAGNOSIS — A599 Trichomoniasis, unspecified: Secondary | ICD-10-CM

## 2011-12-01 NOTE — Progress Notes (Signed)
HISTORY OF PRESENT ILLNESS  [name] is a 51 y.o. year old female,G1P0, who presents for a problem visit. The patient was treated for trichomoniasis.  Gonorrhea and Chlamydia cultures were negative.  She presents for a test of cure. Her partner says that he was treated.  Subjective:  Her discharge has resolved.  Objective:  BP 112/64  Ht 5\' 3"  (1.6 m)  Wt 235 lb (106.595 kg)  BMI 41.63 kg/m2   GI: soft and nontender  External genitalia: normal general appearance Vaginal: atrophic mucosa and no discharge present Cervix: absent Adnexa: normal bimanual exam Uterus: absent  Wet prep: No trichomoniasis seen  Assessment:  trichomoniasis-treated  Plan:  Return to normal activities.  Return to office for annual exam.  Leonard Schwartz M.D.  12/01/2011 2:41 PM

## 2011-12-14 ENCOUNTER — Encounter: Payer: Self-pay | Admitting: Obstetrics and Gynecology

## 2011-12-14 ENCOUNTER — Ambulatory Visit (INDEPENDENT_AMBULATORY_CARE_PROVIDER_SITE_OTHER): Payer: Medicare HMO | Admitting: Obstetrics and Gynecology

## 2011-12-14 VITALS — BP 140/88 | Resp 18 | Ht 63.0 in | Wt 226.0 lb

## 2011-12-14 DIAGNOSIS — R21 Rash and other nonspecific skin eruption: Secondary | ICD-10-CM

## 2011-12-14 DIAGNOSIS — B009 Herpesviral infection, unspecified: Secondary | ICD-10-CM

## 2011-12-14 DIAGNOSIS — Z01419 Encounter for gynecological examination (general) (routine) without abnormal findings: Secondary | ICD-10-CM

## 2011-12-14 MED ORDER — VALACYCLOVIR HCL 1 G PO TABS: ORAL_TABLET | ORAL | Status: DC

## 2011-12-14 MED ORDER — ACYCLOVIR 5 % EX OINT: TOPICAL_OINTMENT | CUTANEOUS | Status: DC

## 2011-12-14 NOTE — Progress Notes (Signed)
Subjective:    Molly Klein is a 51 y.o. female G1P0 who presents for annual exam. The patient complaints of stress with her partner. She has a history of herpes virus and also hepatitis C.  She is status post hysterectomy.  She has a past history of a deep venous thrombosis associated with hormone replacement therapy. She has urinary incontinence.  The following portions of the patient's history were reviewed and updated as appropriate: allergies, current medications, past family history, past medical history, past social history, past surgical history and problem list.  Review of Systems Pertinent items are noted in HPI. Gastrointestinal:No change in bowel habits, no abdominal pain, no rectal bleeding Genitourinary:negative for dysuria, frequency, hematuria, nocturia and urinary incontinence    Objective:     BP 140/88  Resp 18  Ht 5\' 3"  (1.6 m)  Wt 226 lb (102.513 kg)  BMI 40.03 kg/m2  Weight:  Wt Readings from Last 1 Encounters:  12/14/11 226 lb (102.513 kg)     BMI: Body mass index is 40.03 kg/(m^2). General Appearance: Alert, appropriate appearance for age. No acute distress HEENT: Grossly normal Neck / Thyroid: Supple, no masses, nodes or enlargement Lungs: clear to auscultation bilaterally Back: No CVA tenderness Breast Exam: No masses or nodes.No dimpling, nipple retraction or discharge. Cardiovascular: Regular rate and rhythm. S1, S2, no murmur Gastrointestinal: Soft, non-tender, no masses or organomegaly  ++++++++++++++++++++++++++++++++++++++++++++++++++++++++  Pelvic Exam: External genitalia: normal general appearance Vaginal: normal without tenderness, induration or masses and relaxation noted Cervix: absent Adnexa: normal bimanual exam Uterus: absent Rectovaginal: normal rectal, no masses  ++++++++++++++++++++++++++++++++++++++++++++++++++++++++  Lymphatic Exam: Non-palpable nodes in neck, clavicular, axillary, or inguinal regions  Psychiatric: Alert  and oriented, appropriate affect.      Assessment:    Normal gyn exam   Hepatitis C - stable  Herpes virus  Stress with her partner  History of a deep venous thrombosis on hormone replacement therapy  Overweight or obese: Yes  Pelvic relaxation: No  Menopausal symptoms: No. Severe: No.   Plan:    All questions answered. Mammogram.   Follow-up:  for annual exam  Valtrex 500 mg each day if needed  Acyclovir ointment as needed  The updated Pap smear screening guidelines were discussed with the patient. The patient requested that I obtain a Pap smear: No.  Kegel exercises discussed: Yes.  Proper diet and regular exercise were reviewed.  Annual mammograms recommended starting at age 25. Proper breast care was discussed.  Screening colonoscopy is recommended beginning at age 58.  Regular health maintenance was reviewed.  Sleep hygiene was discussed.  Adequate calcium and vitamin D intake was emphasized.  Leonard Schwartz M.D.   Regular Periods: no Mammogram: 01/07/2011 "Blood from R breast"  Monthly Breast Ex.: no Exercise: no  Tetanus < 10 years: no Seatbelts: yes  NI. Bladder Functn.: no "Frequent" Abuse at home: no  Daily BM's: yes Stressful Work: no  Healthy Diet: no Sigmoid-Colonoscopy: "2010" WNL  Calcium: no Medical problems this year: none per pt.    LAST PAP:12/30/2010  Contraception: HYST  Mammogram:  2012  PCP: Dr.Lowne  PMH: No Changes  FMH: No changes  Last Bone Scan: Never per pt.

## 2011-12-18 ENCOUNTER — Ambulatory Visit (INDEPENDENT_AMBULATORY_CARE_PROVIDER_SITE_OTHER): Payer: Medicare HMO | Admitting: Pulmonary Disease

## 2011-12-18 ENCOUNTER — Encounter: Payer: Self-pay | Admitting: Pulmonary Disease

## 2011-12-18 ENCOUNTER — Ambulatory Visit (INDEPENDENT_AMBULATORY_CARE_PROVIDER_SITE_OTHER)
Admission: RE | Admit: 2011-12-18 | Discharge: 2011-12-18 | Disposition: A | Discharge status: Home / Self Care | Payer: Medicare HMO | Source: Ambulatory Visit | Attending: Pulmonary Disease | Admitting: Pulmonary Disease

## 2011-12-18 VITALS — BP 126/80 | HR 101 | Temp 97.4°F | Ht 63.0 in | Wt 234.0 lb

## 2011-12-18 DIAGNOSIS — R9389 Abnormal findings on diagnostic imaging of other specified body structures: Secondary | ICD-10-CM

## 2011-12-18 DIAGNOSIS — G4733 Obstructive sleep apnea (adult) (pediatric): Secondary | ICD-10-CM

## 2011-12-18 DIAGNOSIS — J45909 Unspecified asthma, uncomplicated: Secondary | ICD-10-CM

## 2011-12-18 LAB — PULMONARY FUNCTION TEST

## 2011-12-18 MED ORDER — MONTELUKAST SODIUM 10 MG PO TABS: 10.0000 mg | ORAL_TABLET | Freq: Every day | ORAL | Status: DC

## 2011-12-18 NOTE — Progress Notes (Signed)
Chief Complaint  Patient presents with  . Follow-up    w/ PFT. breathing has unchanged, slight w/ occasional phlem, wheezing, chest tx, still SOB w/ exertion    History of Present Illness: Molly Klein is a 51 y.o. female former smoker with asthma, and pulmonary nodule.  She also has hx of OSA and narcolepsy.  Her breathing is okay.  She has occasional cough and wheeze.  She uses her albuterol every other day.  She has some sputum production.  She gets sinus congestion and post-nasal drip.  She denies fever, sweats, or hemoptysis.  She had PFT today which was negative for obstruction, had normal lung volumes except for reduced ERV, moderate diffusion defect that corrected for lung volumes, and positive bronchodilator response based on change in FEF 25/75%.  She has a CPAP machine.  She uses this about 2 nights per week.  She has nasal pillow mask.  She has not had recent sleep study.  She sleeps better, and feels better when she uses CPAP.  She does not snore if she uses CPAP.  She thinks the pressure from her machine is okay.    Tests: PFT 12/18/11>>FEV1 2.10 (89%), FEV1% 82, TLC 3.86 (81%), DLCO 63%, +BD from FEF 25-75% CT chest 10/02/11>>minimal reticular nodular opacity Rt lung base, distal esophageal thickening  Past Medical History  Diagnosis Date  . Asthma   . GERD (gastroesophageal reflux disease)   . Hepatitis C   . DVT (deep venous thrombosis)   . Nipple discharge     right breast  . Neuromuscular disorder     neuropathy lt arm  . OSA (obstructive sleep apnea)     Past Surgical History  Procedure Date  . Cholecystectomy   . Pilonidal cyst excision   . Dilation and curettage of uterus   . Tonsillectomy   . Abdominal hysterectomy   . Fracture surgery     fx lt arm  . Breast biopsy 02/06/2011    Procedure: BREAST BIOPSY;  Surgeon: Velora Heckler, MD;  Location: [facility];  Service: General;  Laterality: Right;  right breast biopsy    Current  Outpatient Prescriptions on File Prior to Visit  Medication Sig Dispense Refill  . acyclovir ointment (ZOVIRAX) 5 % Apply topically every 3 (three) hours.  30 g  2  . albuterol (PROVENTIL HFA;VENTOLIN HFA) 108 (90 BASE) MCG/ACT inhaler Inhale 2 puffs into the lungs 4 (four) times daily as needed.      Marland Kitchen albuterol (PROVENTIL) (2.5 MG/3ML) 0.083% nebulizer solution Take 3 mLs (2.5 mg total) by nebulization as directed.  75 mL  1  . budesonide-formoterol (SYMBICORT) 160-4.5 MCG/ACT inhaler Inhale 2 puffs into the lungs 2 (two) times daily.  1 Inhaler  6  . esomeprazole (NEXIUM) 40 MG capsule Take 1 capsule (40 mg total) by mouth daily.  30 capsule  3  . gabapentin (NEURONTIN) 300 MG capsule Take 300 mg by mouth every evening.       . modafinil (PROVIGIL) 200 MG tablet Take 200 mg by mouth daily.        Marland Kitchen Respiratory Therapy Supplies (NEBULIZER COMPRESSOR) KIT by Does not apply route.        . valACYclovir (VALTREX) 1000 MG tablet 1 po tid for 10 days  30 tablet  0    Allergies  Allergen Reactions  . Codeine Nausea And Vomiting  . Interferon Beta-1a   . Sulfonamide Derivatives Nausea Only    Physical Exam: Filed Vitals:  12/18/11 1108 12/18/11 1112  BP:  126/80  Pulse:  101  Temp: 97.4 F (36.3 C)   TempSrc: Oral   Height: 5\' 3"  (1.6 m)   Weight: 234 lb (106.142 kg)   SpO2:  97%  ,  Current Encounter SPO2  12/18/11 1112 97%  10/22/11 1635 97%  10/07/11 1452 94%    Wt Readings from Last 3 Encounters:  12/18/11 234 lb (106.142 kg)  12/14/11 226 lb (102.513 kg)  12/01/11 235 lb (106.595 kg)    Body mass index is 41.45 kg/(m^2).   General - No distress ENT - No sinus tenderness, no oral exudate, no LAN Cardiac - s1s2 regular, no murmur, pulses symmetric Chest - No wheeze/rales/dullness, good air entry, normal respiratory excursion Back - No focal tenderness Abd - Soft, non-tender Ext - No edema Neuro - Normal strength Skin - No rashes Psych - Normal mood, and  behavior  Assessment/Plan:  Coralyn Helling, MD [facility] Pager:  [phone] 12/18/2011, 11:19 AM

## 2011-12-18 NOTE — Progress Notes (Signed)
PFT done today. 

## 2011-12-18 NOTE — Patient Instructions (Signed)
Chest xray today Singulair 10 mg one pill nightly before bedtime Will schedule sleep study Follow up in 2 months

## 2011-12-21 ENCOUNTER — Telehealth: Payer: Self-pay | Admitting: Pulmonary Disease

## 2011-12-21 NOTE — Assessment & Plan Note (Signed)
She had frequent use of albuterol.  Will add singulair to her regimen.  She is to continue symbicort.

## 2011-12-21 NOTE — Assessment & Plan Note (Signed)
Will repeat chest xray today, and then decide if repeat CT chest is needed at this time.

## 2011-12-21 NOTE — Telephone Encounter (Signed)
Dg Chest 2 View  12/18/2011  *RADIOLOGY REPORT*  Clinical Data: Asthma.  Cough.  Former smoker.  CHEST - 2 VIEW  Comparison:  10/26/2008  Findings:  The heart size and mediastinal contours are within normal limits.  Both lungs are clear.  No suspicious pulmonary nodules or masses visualized radiographically.  The visualized skeletal structures are unremarkable.  IMPRESSION: No active cardiopulmonary disease.   Original Report Authenticated By: Myles Rosenthal, M.D.     Will have my nurse inform patient that chest xray was normal.  No change to current treatment plan.

## 2011-12-21 NOTE — Assessment & Plan Note (Signed)
She has persistent symptoms of daytime sleepiness.  She has a history of sleep apnea, and carries a diagnosis of narcolepsy.  I would to optimize her sleep apnea regimen first, and then determine if additional evaluation is needed for possible narcolepsy.  However, except for daytime sleepiness, she does not report other typical symptoms associated with narcolepsy.  Will arrange for in lab sleep study.

## 2011-12-23 NOTE — Telephone Encounter (Signed)
I spoke with patient about results and she verbalized understanding and had no questions 

## 2012-01-13 ENCOUNTER — Encounter (HOSPITAL_COMMUNITY): Payer: Self-pay | Admitting: Emergency Medicine

## 2012-01-13 ENCOUNTER — Emergency Department (HOSPITAL_COMMUNITY)
Admission: EM | Admit: 2012-01-13 | Discharge: 2012-01-13 | Disposition: A | Discharge status: Home / Self Care | Payer: Medicare HMO | Attending: Emergency Medicine | Admitting: Emergency Medicine

## 2012-01-13 ENCOUNTER — Emergency Department (HOSPITAL_COMMUNITY): Payer: Medicare HMO

## 2012-01-13 DIAGNOSIS — Z79899 Other long term (current) drug therapy: Secondary | ICD-10-CM | POA: Insufficient documentation

## 2012-01-13 DIAGNOSIS — W010XXA Fall on same level from slipping, tripping and stumbling without subsequent striking against object, initial encounter: Secondary | ICD-10-CM | POA: Insufficient documentation

## 2012-01-13 DIAGNOSIS — K219 Gastro-esophageal reflux disease without esophagitis: Secondary | ICD-10-CM | POA: Insufficient documentation

## 2012-01-13 DIAGNOSIS — J45909 Unspecified asthma, uncomplicated: Secondary | ICD-10-CM | POA: Insufficient documentation

## 2012-01-13 DIAGNOSIS — S59909A Unspecified injury of unspecified elbow, initial encounter: Secondary | ICD-10-CM

## 2012-01-13 DIAGNOSIS — S93609A Unspecified sprain of unspecified foot, initial encounter: Secondary | ICD-10-CM

## 2012-01-13 DIAGNOSIS — Z8619 Personal history of other infectious and parasitic diseases: Secondary | ICD-10-CM | POA: Insufficient documentation

## 2012-01-13 DIAGNOSIS — Z87891 Personal history of nicotine dependence: Secondary | ICD-10-CM | POA: Insufficient documentation

## 2012-01-13 DIAGNOSIS — Y929 Unspecified place or not applicable: Secondary | ICD-10-CM | POA: Insufficient documentation

## 2012-01-13 DIAGNOSIS — S63509A Unspecified sprain of unspecified wrist, initial encounter: Secondary | ICD-10-CM

## 2012-01-13 DIAGNOSIS — G4733 Obstructive sleep apnea (adult) (pediatric): Secondary | ICD-10-CM | POA: Insufficient documentation

## 2012-01-13 DIAGNOSIS — Y9301 Activity, walking, marching and hiking: Secondary | ICD-10-CM | POA: Insufficient documentation

## 2012-01-13 DIAGNOSIS — Z86718 Personal history of other venous thrombosis and embolism: Secondary | ICD-10-CM | POA: Insufficient documentation

## 2012-01-13 MED ORDER — OXYCODONE-ACETAMINOPHEN 5-325 MG PO TABS
2.0000 | ORAL_TABLET | Freq: Once | ORAL | Status: AC
  Administered 2012-01-13: 2 via ORAL
  Filled 2012-01-13: qty 2

## 2012-01-13 MED ORDER — HYDROCODONE-ACETAMINOPHEN 5-325 MG PO TABS: 1.0000 | ORAL_TABLET | Freq: Four times a day (QID) | ORAL | Status: DC | PRN

## 2012-01-13 NOTE — ED Provider Notes (Signed)
Medical screening examination/treatment/procedure(s) were performed by non-physician practitioner and as supervising physician I was immediately available for consultation/collaboration.  Kahari Critzer T Laconda Basich, MD 01/13/12 2303 

## 2012-01-13 NOTE — ED Notes (Signed)
Patient transported to X-ray 

## 2012-01-13 NOTE — ED Notes (Signed)
Pt reports that she tripped and fell forward onto both arms. R/elbow abrasion noted, decreased ROM due to pain. Swollen l/wrist noted. Pt stated that the 3rd toe (nail) on r/foot  is torn, c/o pain in toes 3-5

## 2012-01-13 NOTE — ED Provider Notes (Signed)
History   This chart was scribed for Toy Baker, MD by Charolett Bumpers, ED Scribe. The patient was seen in room WTR9/WTR9. Patient's care was started at 1617.   CSN: [id number]  Arrival date & time 01/13/12  1547   First MD Initiated Contact with Patient 01/13/12 1617     CC: Fall   The history is provided by the patient. No language interpreter was used.   Molly Klein is a 51 y.o. female who presents to the Emergency Department complaining of constant, severe left wrist pain after a fall that occurred earlier today. She reports that she tripped and fell while walking on the sidewalk landing on her left wrist. She denies any LOC. She reports associated right elbow pain with an abrasion and right foot pain. She rates her pain 10/10 and is aggravated with movement. 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   Past Medical History  Diagnosis Date  . Asthma   . GERD (gastroesophageal reflux disease)   . Hepatitis C   . DVT (deep venous thrombosis)   . Nipple discharge     right breast  . Neuromuscular disorder     neuropathy lt arm  . OSA (obstructive sleep apnea)     Past Surgical History  Procedure Date  . Cholecystectomy   . Pilonidal cyst excision   . Dilation and curettage of uterus   . Tonsillectomy   . Abdominal hysterectomy   . Fracture surgery     fx lt arm  . Breast biopsy 02/06/2011    Procedure: BREAST BIOPSY;  Surgeon: Velora Heckler, MD;  Location: [facility];  Service: General;  Laterality: Right;  right breast biopsy    Family History  Problem Relation Age of Onset  . Rectal cancer Maternal Grandmother   . Cancer Sister     pt unaware of what kind  . Cancer Maternal Grandmother     breast  . Heart attack Mother     mi in 34s  . Alcohol abuse Mother     cirrhosis of liver  . Heart attack Father     MI in 12s     History  Substance Use Topics  . Smoking status: Former Smoker -- 5 years    Types: Cigarettes    Quit date: 02/03/1975  . Smokeless tobacco: Never Used     Comment: 1 pack per week  . Alcohol Use: No    OB History    Grav Para Term Preterm Abortions TAB SAB Ect Mult Living   1 0        0      Review of Systems A complete 10 system review of systems was obtained and all systems are negative except as noted in the HPI and PMH.   Allergies  Codeine; Interferon beta-1a; and Sulfonamide derivatives  Home Medications   Current Outpatient Rx  Name  Route  Sig  Dispense  Refill  . ALBUTEROL SULFATE HFA 108 (90 BASE) MCG/ACT IN AERS   Inhalation   Inhale 2 puffs into the lungs 4 (four) times daily as needed. Shortness of breath         . ALBUTEROL SULFATE (2.5 MG/3ML) 0.083% IN NEBU   Nebulization   Take 2.5 mg by nebulization as directed. wheezing         . BUDESONIDE-FORMOTEROL FUMARATE 160-4.5 MCG/ACT IN AERO  Inhalation   Inhale 2 puffs into the lungs 2 (two) times daily.   1 Inhaler   6   . ESOMEPRAZOLE MAGNESIUM 40 MG PO CPDR   Oral   Take 1 capsule (40 mg total) by mouth daily.   30 capsule   3   . GABAPENTIN 300 MG PO CAPS   Oral   Take 300 mg by mouth every evening.          Marland Kitchen MONTELUKAST SODIUM 10 MG PO TABS   Oral   Take 10 mg by mouth daily.           There were no vitals taken for this visit.  Physical Exam  Nursing note and vitals reviewed. Constitutional: She is oriented to person, place, and time. She appears well-developed and well-nourished. No distress.  HENT:  Head: Normocephalic and atraumatic.  Eyes: Conjunctivae normal and EOM are normal.  Neck: Neck supple. No tracheal deviation present.  Cardiovascular: Normal rate, regular rhythm, normal heart sounds and intact distal pulses.   Pulmonary/Chest: Effort normal and breath sounds normal. No respiratory distress. She has no wheezes.  Musculoskeletal: Normal range of  motion. She exhibits edema and tenderness.       On left wrist, distal ulna and radius tenderness, snuff box tenderness and swelling noted over carpal bones. Pain with ROM. Tenderness over the right olecranon with good ROM and a 1 inch abrasion. 2nd and 3rd metatarsal tenderness on right foot.   Neurological: She is alert and oriented to person, place, and time.  Skin: Skin is warm and dry.  Psychiatric: She has a normal mood and affect. Her behavior is normal.    ED Course  Procedures (including critical care time)   COORDINATION OF CARE:  16:30-Discussed planned course of treatment with the patient including pain medication, x-ray of left wrist, right elbow and right foot, who is agreeable at this time.  16:45-Medication Orders: Oxycodone-acetaminophen (Percocet/Roxicet) 5-325 mg per tablet 2 tablet-once.   Labs Reviewed - No data to display Dg Elbow Complete Right  01/13/2012  *RADIOLOGY REPORT*  Clinical Data:  Posterior right elbow pain post fall  RIGHT ELBOW - COMPLETE 3+ VIEW  Comparison: None  Findings: Osseous demineralization. Mild deformity of the articular margin of the right radial head is identified  though an acute fracture plane is not identified. This could potentially represent sequela of a prior right radial head fracture. No additional fracture, dislocation, or bone destruction seen. No elbow joint effusion.  IMPRESSION: Deformity of the right radial head, question sequela of remote fracture. The absence of accompanying joint effusion makes this less likely to represent an occult acute injury. Follow-up exams recommended if the patient's symptoms persist.   Original Report Authenticated By: Ulyses Southward, M.D.    Dg Wrist Complete Left  01/13/2012  *RADIOLOGY REPORT*  Clinical Data: Left wrist pain post fall  LEFT WRIST - COMPLETE 3+ VIEW  Comparison: None  Findings: Osseous demineralization. Joint spaces preserved. No acute fracture, dislocation or bone destruction. Soft  tissues unremarkable.  IMPRESSION: No acute osseous abnormalities.   Original Report Authenticated By: Ulyses Southward, M.D.    Dg Foot Complete Right  01/13/2012  *RADIOLOGY REPORT*  Clinical Data: Diffuse right foot pain  RIGHT FOOT COMPLETE - 3+ VIEW  Comparison: 02/08/2008  Findings: Osseous demineralization. Joint spaces preserved. No acute fracture, dislocation, or bone destruction. Plantar and Achilles insertion calcaneal spurs.  IMPRESSION: No acute osseous abnormalities.   Original Report Authenticated By: Ulyses Southward, M.D.  1. Wrist sprain   2. Elbow injury   3. Foot sprain       MDM  51 y/o female with left wrist sprain, right foot sprain and right elbow injury. xrays without any acute bony abnormality, however radial head questionable on elbow xray. Sling, wrist splint, and post-op boot given. Vicodin for break through pain. Advised RICE and ibuprofen. She will f/u with ortho in 2 days for re-eval of her elbow. Neurovascularly intact.   I personally performed the services described in this documentation, which was scribed in my presence. The recorded information has been reviewed and is accurate.      Trevor Mace, PA-C 01/13/12 1806

## 2012-01-17 ENCOUNTER — Encounter (HOSPITAL_BASED_OUTPATIENT_CLINIC_OR_DEPARTMENT_OTHER): Payer: Medicare HMO

## 2012-01-18 ENCOUNTER — Other Ambulatory Visit: Payer: Self-pay | Admitting: Orthopedic Surgery

## 2012-01-18 DIAGNOSIS — R531 Weakness: Secondary | ICD-10-CM

## 2012-01-18 DIAGNOSIS — M25521 Pain in right elbow: Secondary | ICD-10-CM

## 2012-01-18 DIAGNOSIS — M25532 Pain in left wrist: Secondary | ICD-10-CM

## 2012-01-18 DIAGNOSIS — R609 Edema, unspecified: Secondary | ICD-10-CM

## 2012-01-19 ENCOUNTER — Ambulatory Visit
Admission: RE | Admit: 2012-01-19 | Discharge: 2012-01-19 | Disposition: A | Discharge status: Home / Self Care | Payer: Medicare HMO | Source: Ambulatory Visit | Attending: Orthopedic Surgery | Admitting: Orthopedic Surgery

## 2012-01-19 ENCOUNTER — Other Ambulatory Visit: Payer: Self-pay

## 2012-01-19 DIAGNOSIS — M25532 Pain in left wrist: Secondary | ICD-10-CM

## 2012-01-19 DIAGNOSIS — R609 Edema, unspecified: Secondary | ICD-10-CM

## 2012-01-19 DIAGNOSIS — M25521 Pain in right elbow: Secondary | ICD-10-CM

## 2012-01-19 DIAGNOSIS — R531 Weakness: Secondary | ICD-10-CM

## 2012-01-21 ENCOUNTER — Ambulatory Visit
Admission: RE | Admit: 2012-01-21 | Discharge: 2012-01-21 | Disposition: A | Discharge status: Home / Self Care | Payer: Medicare HMO | Source: Ambulatory Visit | Attending: Orthopedic Surgery | Admitting: Orthopedic Surgery

## 2012-01-21 DIAGNOSIS — M25521 Pain in right elbow: Secondary | ICD-10-CM

## 2012-01-21 DIAGNOSIS — R531 Weakness: Secondary | ICD-10-CM

## 2012-01-21 DIAGNOSIS — R609 Edema, unspecified: Secondary | ICD-10-CM

## 2012-01-25 ENCOUNTER — Telehealth: Payer: Self-pay | Admitting: Family Medicine

## 2012-01-25 DIAGNOSIS — R21 Rash and other nonspecific skin eruption: Secondary | ICD-10-CM

## 2012-01-25 NOTE — Telephone Encounter (Signed)
Patient requesting a new Dermatology referral.  The last one was for the same problem she is now having, Lesions on the right leg, patient states now spreading to left leg.  They are round spots, some as large as 50 cent pieces, they itch, and if she scratches they bleed.  States Dr. Terri Piedra has been treating her for this with 2 different creams, neither are making any changes in the lesions.  Patient does not want to go back to Dr. Terri Piedra, requests new dermatology practice.  See 09/28/11 & 10/06/11 office visits with Dr. Laury Axon.

## 2012-01-26 ENCOUNTER — Telehealth: Payer: Self-pay | Admitting: Family Medicine

## 2012-01-26 NOTE — Telephone Encounter (Signed)
Discuss with patient referral placed. 

## 2012-01-26 NOTE — Telephone Encounter (Signed)
pt called stated she was advised by someone last wk - Can not remember who -- but she needs a surgical history today if possible  Cb# [phone]

## 2012-01-26 NOTE — Telephone Encounter (Signed)
Patient wants copy printed of her surgical history, will pick it up on Thurs 12/26

## 2012-01-26 NOTE — Telephone Encounter (Signed)
Ok for new referral for 1st available derm

## 2012-02-10 ENCOUNTER — Ambulatory Visit (HOSPITAL_BASED_OUTPATIENT_CLINIC_OR_DEPARTMENT_OTHER): Payer: Medicare HMO | Attending: Pulmonary Disease

## 2012-02-10 VITALS — Ht 63.0 in | Wt 238.0 lb

## 2012-02-10 DIAGNOSIS — G4733 Obstructive sleep apnea (adult) (pediatric): Secondary | ICD-10-CM | POA: Insufficient documentation

## 2012-02-18 ENCOUNTER — Other Ambulatory Visit: Payer: Self-pay | Admitting: Obstetrics and Gynecology

## 2012-02-18 DIAGNOSIS — Z1231 Encounter for screening mammogram for malignant neoplasm of breast: Secondary | ICD-10-CM

## 2012-02-25 DIAGNOSIS — G4733 Obstructive sleep apnea (adult) (pediatric): Secondary | ICD-10-CM

## 2012-02-26 NOTE — Procedures (Signed)
NAME:  Molly Klein, Molly Klein NO.:  [id number]  MEDICAL RECORD NO.:  [id number]          PATIENT TYPE:  OUT  LOCATION:  SLEEP CENTER                 FACILITY[facility]  PHYSICIAN:  Coralyn Helling, MD        DATE OF BIRTH:  October 04, 1960  DATE OF STUDY:  02/10/2012                           NOCTURNAL POLYSOMNOGRAM  REFERRING PHYSICIAN:  Coralyn Helling, MD  INDICATION FOR STUDY:  Molly Klein is a 52 year old female who has symptoms of snoring, sleep disruption, and daytime sleepiness.  She has a prior diagnosis of obstructive sleep apnea and narcolepsy.  She has not been on therapy for her sleep apnea recently.  She returned to the sleep lab for evaluation of hypersomnia with obstructive sleep apnea.  Height is 5 feet 3 inches, weight is 238 pounds, BMI is 42, neck size is 15 inches.  EPWORTH SLEEPINESS SCORE:  11.  MEDICATIONS:  None.  SLEEP ARCHITECTURE:  Total recording time was 361 minute, total sleep time was 273 minutes, sleep efficiency was 75%.  Sleep latency was 25 minutes.  REM latency was 179 minutes.  The study was notable for the lack of slow-wave sleep and the patient slept in both the supine and non- supine positions.  RESPIRATORY DATA:  The average respiratory rate was 16.  Loud snoring was noted by the technician.  The overall apnea/hypopnea index was 87.7. The events were exclusively obstructive in nature.  OXYGEN DATA:  The baseline oxygenation shows 96%.  The oxygen saturation nadir was 75%.  The study was conducted without the use of supplemental oxygen.  CARDIAC DATA:  The average heart rate 64 and the rhythm strip showed sinus rhythm with occasional PVCs and PACs.  MOVEMENT-PARASOMNIA:  The patient had 1 restroom trip.  Periodic limb movement index was 0.  IMPRESSIONS-RECOMMENDATIONS:  This study shows evidence for severe obstructive sleep apnea with an apnea-hypopnea index of 87.7 and oxygen saturation nadir was 75%.  In addition to diet,  exercise, and weight reduction, I would recommend that the patient return to the sleep lab for a CPAP titration study.  After she has her obstructive sleep apnea well controlled, she should then undergo further assessment to determine whether in fact she does have narcolepsy in addition to sleep-disordered breathing.     Coralyn Helling, MD Diplomat, American Board of Sleep Medicine    VS/MEDQ  D:  02/25/2012 14:56:03  T:  02/26/2012 01:27:40  Job:  [id number]

## 2012-03-01 ENCOUNTER — Telehealth: Payer: Self-pay | Admitting: Pulmonary Disease

## 2012-03-01 DIAGNOSIS — G4733 Obstructive sleep apnea (adult) (pediatric): Secondary | ICD-10-CM

## 2012-03-01 NOTE — Telephone Encounter (Signed)
PSG 02/10/12 >> AHI 87.7, SpO2 low 75%.  Left message asking pt to call back to discuss sleep test results.  Will forward to my nurse to be aware of conversation.

## 2012-03-02 NOTE — Telephone Encounter (Signed)
lmtcb x1 for pt. 

## 2012-03-03 ENCOUNTER — Telehealth: Payer: Self-pay | Admitting: Pulmonary Disease

## 2012-03-03 NOTE — Telephone Encounter (Signed)
Returning call can be reached at [phone].Molly Klein

## 2012-03-03 NOTE — Telephone Encounter (Signed)
Spoke with pt about sleep study results.  Will arrange for Auto CPAP set up and new mask refit.  Will get download after change to auto CPAP.  Will have my nurse schedule ROV in 3 to 4 weeks.

## 2012-03-03 NOTE — Telephone Encounter (Signed)
Phone noted entered in error.

## 2012-03-03 NOTE — Telephone Encounter (Signed)
Patient returning call.

## 2012-03-07 NOTE — Telephone Encounter (Signed)
I spoke with pt. she is scheduled for 04/01/12 at 2:00. Nothing further was needed

## 2012-03-29 ENCOUNTER — Ambulatory Visit: Payer: Medicare HMO

## 2012-04-01 ENCOUNTER — Encounter: Payer: Self-pay | Admitting: Pulmonary Disease

## 2012-04-01 ENCOUNTER — Ambulatory Visit (INDEPENDENT_AMBULATORY_CARE_PROVIDER_SITE_OTHER): Payer: Medicare Other | Admitting: Pulmonary Disease

## 2012-04-01 VITALS — BP 126/84 | HR 75 | Temp 97.9°F | Ht 63.0 in | Wt 239.0 lb

## 2012-04-01 DIAGNOSIS — J4530 Mild persistent asthma, uncomplicated: Secondary | ICD-10-CM

## 2012-04-01 DIAGNOSIS — G4733 Obstructive sleep apnea (adult) (pediatric): Secondary | ICD-10-CM

## 2012-04-01 DIAGNOSIS — J45909 Unspecified asthma, uncomplicated: Secondary | ICD-10-CM

## 2012-04-01 DIAGNOSIS — R911 Solitary pulmonary nodule: Secondary | ICD-10-CM

## 2012-04-01 DIAGNOSIS — J3489 Other specified disorders of nose and nasal sinuses: Secondary | ICD-10-CM

## 2012-04-01 DIAGNOSIS — G47419 Narcolepsy without cataplexy: Secondary | ICD-10-CM

## 2012-04-01 DIAGNOSIS — R0981 Nasal congestion: Secondary | ICD-10-CM | POA: Insufficient documentation

## 2012-04-01 MED ORDER — MOMETASONE FUROATE 50 MCG/ACT NA SUSP: 2.0000 | Freq: Every day | NASAL | Status: DC

## 2012-04-01 NOTE — Progress Notes (Signed)
Chief Complaint  Patient presents with  . Asthma    Breathing is unchanged. Reports chest pain, chest tightness, SOB and coughing at times.    History of Present Illness: Molly Klein is a 52 y.o. female former smoker with asthma, and pulmonary nodule.  She also has hx of OSA and narcolepsy.  She had her sleep study in January.  This showed severe sleep apnea.  She has been started on CPAP.  She uses nasal pillows.  She is sleeping better, but still has to use the bathroom frequently at night.  She does not have a consistent sleep/wake pattern.  She still feels sleepy during the day.  She uses [employer] for her DME.  Her breathing has been doing better since she was started on singulair.  She is enrolled in an asthma study and had her symbicort replaced with a ICS/LABA combination.  This study will continue for another few months.  She has been getting nasal congestion with post-nasal drip.  TESTS: PFT 12/18/11>>FEV1 2.10 (89%), FEV1% 82, TLC 3.86 (81%), DLCO 63%, +BD from FEF 25-75%  CT chest 10/02/11>>minimal reticular nodular opacity Rt lung base, distal esophageal thickening  PSG 02/10/12 >> AHI 87.7, SpO2 low 75%.   Molly Klein  has a past medical history of Asthma; GERD (gastroesophageal reflux disease); Hepatitis C; DVT (deep venous thrombosis); Nipple discharge; Neuromuscular disorder; and OSA (obstructive sleep apnea).  Molly Klein  has past surgical history that includes Cholecystectomy; Pilonidal cyst excision; Dilation and curettage of uterus; Tonsillectomy; Abdominal hysterectomy; Fracture surgery; and Breast biopsy (02/06/2011).  Prior to Admission medications   Medication Sig Start Date End Date Taking? Authorizing Provider  albuterol (PROVENTIL HFA;VENTOLIN HFA) 108 (90 BASE) MCG/ACT inhaler Inhale 2 puffs into the lungs 4 (four) times daily as needed. Shortness of breath 07/07/11  Yes [name] R Lowne, DO  albuterol (PROVENTIL) (2.5 MG/3ML) 0.083% nebulizer  solution Take 2.5 mg by nebulization as directed. wheezing 08/13/11  Yes Grayling Congress Lowne, DO  budesonide-formoterol (SYMBICORT) 160-4.5 MCG/ACT inhaler Inhale 2 puffs into the lungs 2 (two) times daily. 10/22/11 10/21/12 Yes Coralyn Helling, MD  esomeprazole (NEXIUM) 40 MG capsule Take 1 capsule (40 mg total) by mouth daily. 06/10/11 06/09/12 Yes Wanda Plump, MD  gabapentin (NEURONTIN) 300 MG capsule Take 300 mg by mouth every evening.    Yes Historical Provider, MD  HYDROcodone-acetaminophen (NORCO/VICODIN) 5-325 MG per tablet Take 1-2 tablets by mouth every 6 (six) hours as needed for pain. 01/13/12  Yes Trevor Mace, PA-C  montelukast (SINGULAIR) 10 MG tablet Take 10 mg by mouth daily. 12/18/11  Yes Coralyn Helling, MD    Allergies  Allergen Reactions  . Codeine Nausea And Vomiting  . Interferon Beta-1a   . Sulfonamide Derivatives Nausea Only     Physical Exam:  General - No distress ENT - No sinus tenderness, clear nasal discharge, no oral exudate, no LAN Cardiac - s1s2 regular, no murmur Chest - No wheeze/rales/dullness Back - No focal tenderness Abd - Soft, non-tender Ext - No edema Neuro - Normal strength Skin - No rashes Psych - normal mood, and behavior   Assessment/Plan:  Coralyn Helling, MD [facility] Pager:  [phone] 04/01/2012, 2:03 PM

## 2012-04-01 NOTE — Patient Instructions (Signed)
Will schedule CT chest for August 2014 Nasonex two sprays each nostril daily for one week, then as needed Will get CPAP report and call with results Follow up in August 2014 after CT chest

## 2012-04-01 NOTE — Assessment & Plan Note (Signed)
Will better assess control of her sleep apnea, and then determine if she needs additional intervention for possible narcolepsy.  I have explained to her the importance of regular sleep/wake schedule, and brief scheduled naps during the day.

## 2012-04-01 NOTE — Assessment & Plan Note (Signed)
Will repeat non contrast CT chest for August 2014.

## 2012-04-01 NOTE — Assessment & Plan Note (Signed)
She has improved with addition of singulair.  She is enrolled in an asthma study with ICS/LABA.  Will likely need to resume prescription symbicort after her study protocol is completed.

## 2012-04-01 NOTE — Assessment & Plan Note (Signed)
I have given her a sample nasonex.  She can uses this daily for one week, then as needed.

## 2012-04-01 NOTE — Assessment & Plan Note (Signed)
She has severe sleep apnea.  I have reviewed her sleep test results with the patient.  Explained how sleep apnea can affect the patient's health.  Driving precautions and importance of weight loss were discussed.  Treatment options for sleep apnea were reviewed.  Will get copy of her CPAP download.

## 2012-04-07 ENCOUNTER — Telehealth: Payer: Self-pay | Admitting: Pulmonary Disease

## 2012-04-07 NOTE — Telephone Encounter (Signed)
Auto CPAP 03/23/12 to 04/04/12 >> Used on 12 of 13 nights with average 7 hrs 4 min.  Average AHI 6.5 with mean CPAP 9 cm H2O and 90th percentile CPAP 12 cm H2O.  Will have my nurse inform pt that CPAP report looks very good.  No change to current CPAP set up needed.

## 2012-04-12 NOTE — Telephone Encounter (Signed)
I spoke with patient about results and she verbalized understanding and had no questions 

## 2012-04-12 NOTE — Telephone Encounter (Signed)
lmomtcb x1 for pt 

## 2012-06-28 ENCOUNTER — Ambulatory Visit (INDEPENDENT_AMBULATORY_CARE_PROVIDER_SITE_OTHER): Payer: Medicare HMO | Admitting: Internal Medicine

## 2012-06-28 ENCOUNTER — Encounter: Payer: Self-pay | Admitting: Internal Medicine

## 2012-06-28 VITALS — BP 134/82 | HR 73 | Temp 98.2°F | Wt 234.0 lb

## 2012-06-28 DIAGNOSIS — H15009 Unspecified scleritis, unspecified eye: Secondary | ICD-10-CM

## 2012-06-28 DIAGNOSIS — H15002 Unspecified scleritis, left eye: Secondary | ICD-10-CM

## 2012-06-28 MED ORDER — GABAPENTIN 300 MG PO CAPS: 300.0000 mg | ORAL_CAPSULE | Freq: Every evening | ORAL | Status: DC

## 2012-06-28 NOTE — Patient Instructions (Addendum)
Please use natural tears every 2 hours while awake to cleanse the affected eye. Strict hand hygiene before and after using the natural tears as discussed. To ER if pain persists or is associaled with Warning Signs as discussed: pus , vision loss , pain, & high fever

## 2012-06-28 NOTE — Progress Notes (Signed)
  Subjective:    Patient ID: Molly Klein, female    DOB: Feb 07, 1960, 52 y.o.   MRN: [id number]  HPI Over the holiday weekend she was driving with the automobile window open. She noticed significant itching in her eyes. This led to her scratching her eyes. Upon return home significant hemorrhage was noted by her family members.  The eye was itching and had clear drainage; she denies any purulence  She has possibly had some mild fever; she took Aleve PM.  She has a history of deep venous thrombosis and underwent coagulopathy evaluation to the cancer Center. Apparently she was on warfarin for several months.   Review of Systems  She denies frontal headache, facial pain, nasal purulence, dental pain, or sore throat. She denies epistaxis, hemoptysis, hematuria, melena, or rectal bleeding.She has no unexplained weight loss, dysphagia, or abdominal pain. She has no abnormal bruising or bleeding. She has no difficulty stopping bleeding with injury.       Objective:   Physical Exam General appearance:good health ;well nourished; no acute distress or increased work of breathing is present.  No  lymphadenopathy about the head, neck, or axilla noted.   Eyes: No conjunctival inflammation or lid edema is present. Medially in the left eye there is significant scleral hemorrhage. There is no scleral icterus.  Ears:  External ear exam shows no significant lesions or deformities.  Otoscopic examination reveals clear canals, tympanic membranes are intact bilaterally without bulging, retraction, inflammation or discharge.  Nose:  External nasal examination shows no deformity or inflammation. Nasal mucosa are pink and moist without lesions or exudates. No septal dislocation or deviation.No obstruction to airflow.   Oral exam: Dental hygiene is good; lips and gums are healthy appearing.There is no oropharyngeal erythema or exudate noted.   Neck:  No deformities,  masses, or tenderness noted.   Supple  with full range of motion without pain.   Heart:  Normal rate and regular rhythm. S1 and S2 normal without gallop, murmur, click, rub or other extra sounds.   Lungs:Chest clear to auscultation; no wheezes, rhonchi,rales ,or rubs present.No increased work of breathing.    Extremities:  No cyanosis, edema, or clubbing  noted    Skin: Warm & dry .         Assessment & Plan:  #1 scleral hemorrhage related to itching/trauma. Clinically no evidence of corneal abrasion or conjunctivitis.  Plan: See orders and recommendations

## 2012-06-28 NOTE — Addendum Note (Signed)
Addended by: Candie Echevaria L on: 06/28/2012 03:27 PM   Modules accepted: Orders

## 2012-06-28 NOTE — Addendum Note (Signed)
Addended byPecola Lawless on: 06/28/2012 02:52 PM   Modules accepted: Orders

## 2012-07-01 ENCOUNTER — Other Ambulatory Visit: Payer: Self-pay | Admitting: Internal Medicine

## 2012-07-01 ENCOUNTER — Telehealth: Payer: Self-pay | Admitting: Family Medicine

## 2012-07-01 DIAGNOSIS — H44812 Hemophthalmos, left eye: Secondary | ICD-10-CM

## 2012-07-01 NOTE — Telephone Encounter (Signed)
Patient states her eye is no better and she would like referral to opthamologist. CB# [phone]

## 2012-07-01 NOTE — Telephone Encounter (Signed)
Hopp please advise  

## 2012-09-19 ENCOUNTER — Ambulatory Visit (INDEPENDENT_AMBULATORY_CARE_PROVIDER_SITE_OTHER)
Admission: RE | Admit: 2012-09-19 | Discharge: 2012-09-19 | Disposition: A | Discharge status: Home / Self Care | Payer: Medicare HMO | Source: Ambulatory Visit | Attending: Pulmonary Disease | Admitting: Pulmonary Disease

## 2012-09-19 DIAGNOSIS — R911 Solitary pulmonary nodule: Secondary | ICD-10-CM

## 2012-09-22 ENCOUNTER — Telehealth: Payer: Self-pay | Admitting: Pulmonary Disease

## 2012-09-22 NOTE — Telephone Encounter (Signed)
Ct Chest Wo Contrast  09/19/2012   *RADIOLOGY REPORT*   Clinical Data: Follow-up solitary pulmonary nodule.  Chest congestion.   CT CHEST WITHOUT CONTRAST   Technique:  Multidetector CT imaging of the chest was performed following the standard protocol without IV contrast.   Comparison: 09/24/2011 and 06/04/2004.   Findings:  No pathologically enlarged mediastinal, hilar or axillary lymph nodes.  Heart size normal.  No pericardial effusion.  Distal esophageal wall appears slightly thickened, which can be seen with gastroesophageal reflux disease.  Mild subpleural scarring in the anterior right hemithorax. Interval clearing of previously seen right lower lobe ground-glass with probable minimal scarring.  No pleural fluid.  Airway is unremarkable.  Incidental imaging of the upper abdomen shows no acute findings. No worrisome lytic or sclerotic lesions.  Old right rib fractures.   IMPRESSION:  Interval clearing of right lower lobe ground-glass with subtle residual scarring.  No acute findings.    Original Report Authenticated By: Leanna Battles, M.D.   Will have my nurse inform pt that CT chest looks better, and she will need next available ROV to review in more detail.

## 2012-09-23 NOTE — Telephone Encounter (Signed)
Pt is aware of results. Has been scheduled for 11/11/2012 at 9:15a.

## 2012-09-26 ENCOUNTER — Other Ambulatory Visit: Payer: Self-pay | Admitting: Family Medicine

## 2012-09-27 ENCOUNTER — Telehealth: Payer: Self-pay | Admitting: *Deleted

## 2012-09-27 MED ORDER — ALBUTEROL SULFATE HFA 108 (90 BASE) MCG/ACT IN AERS: 2.0000 | INHALATION_SPRAY | Freq: Four times a day (QID) | RESPIRATORY_TRACT | Status: DC | PRN

## 2012-09-27 NOTE — Telephone Encounter (Signed)
Please inform pt that proventil HFA is hand held inhaler pump that she can use when she is away from home.  Proventil for nebulizer can only used with her nebulizer machine.  She needs to have both.

## 2012-09-27 NOTE — Telephone Encounter (Signed)
Refill sent. Rickardo Brinegar, CMA  

## 2012-09-27 NOTE — Telephone Encounter (Signed)
Patients pharmacy is requesting proventil HFA.  However the patient has just received a prescription for proventil for Nebulizer 09/26/12. I have been advised to send this issue to Dr. Craige Cotta.    Ag cma

## 2012-09-28 ENCOUNTER — Ambulatory Visit (INDEPENDENT_AMBULATORY_CARE_PROVIDER_SITE_OTHER): Payer: Medicare HMO | Admitting: Family Medicine

## 2012-09-28 ENCOUNTER — Telehealth: Payer: Self-pay

## 2012-09-28 ENCOUNTER — Telehealth: Payer: Self-pay | Admitting: Hematology and Oncology

## 2012-09-28 ENCOUNTER — Encounter: Payer: Self-pay | Admitting: Family Medicine

## 2012-09-28 VITALS — BP 136/86 | HR 76 | Temp 98.0°F | Wt 236.6 lb

## 2012-09-28 DIAGNOSIS — M546 Pain in thoracic spine: Secondary | ICD-10-CM | POA: Insufficient documentation

## 2012-09-28 MED ORDER — NAPROXEN 500 MG PO TABS: 500.0000 mg | ORAL_TABLET | Freq: Two times a day (BID) | ORAL | Status: DC

## 2012-09-28 MED ORDER — CYCLOBENZAPRINE HCL 10 MG PO TABS: 10.0000 mg | ORAL_TABLET | Freq: Three times a day (TID) | ORAL | Status: DC | PRN

## 2012-09-28 NOTE — Telephone Encounter (Signed)
C/D 09/28/12 for appt. 10/21/12

## 2012-09-28 NOTE — Progress Notes (Signed)
  Subjective:    Patient ID: Molly Klein, female    DOB: [birth year], 52 y.o.   MRN: [id number]  HPI Back pain- occurred this AM while bending forward over the bed.  Mid back, just below bra strap in the midline.  'felt like someone punched me, took my breath away'.  + diaphoresis, light headed.  Difficulty getting up off toilet.  No bowel or bladder incontinence, no fever.   Review of Systems For ROS see HPI     Objective:   Physical Exam  Vitals reviewed. Constitutional: She is oriented to person, place, and time. She appears well-developed and well-nourished.  Uncomfortable but not distressed  Cardiovascular: Intact distal pulses.   Musculoskeletal: She exhibits no edema.  No TTP over spine + TTP over thoracic paraspinals and lats  Neurological: She is alert and oriented to person, place, and time. She has normal reflexes.  (-) SLR bilaterally  Skin: Skin is warm and dry.          Assessment & Plan:

## 2012-09-28 NOTE — Patient Instructions (Addendum)
This appears to be a muscle strain/spasm Start the Naproxen twice daily- take w/ food Use the flexeril as needed for muscle spasm- will cause drowsiness HEAT! Call with any questions or concerns If no improvement in the next 7-10 days, or worsening, please call Hang in there!

## 2012-09-28 NOTE — Telephone Encounter (Signed)
S/W PT IN RE TO NP APPT 09/19 @ 10:30 W/DR. VICTOR DX- HX OF DVT WELCOME PACKET MAILED.

## 2012-10-03 NOTE — Assessment & Plan Note (Signed)
New.  No bony tenderness.  + muscle spasm.  Start scheduled NSAIDs, muscle relaxers prn.  Heat.  Reviewed supportive care and red flags that should prompt return.  Pt expressed understanding and is in agreement w/ plan.

## 2012-10-21 ENCOUNTER — Ambulatory Visit: Payer: Medicare HMO

## 2012-10-21 ENCOUNTER — Encounter: Payer: Self-pay | Admitting: Hematology and Oncology

## 2012-10-21 ENCOUNTER — Ambulatory Visit (HOSPITAL_BASED_OUTPATIENT_CLINIC_OR_DEPARTMENT_OTHER): Payer: Medicare HMO | Admitting: Hematology and Oncology

## 2012-10-21 VITALS — BP 147/105 | HR 91 | Temp 97.3°F | Resp 20 | Ht 63.0 in | Wt 236.9 lb

## 2012-10-21 DIAGNOSIS — T148XXA Other injury of unspecified body region, initial encounter: Secondary | ICD-10-CM

## 2012-10-21 DIAGNOSIS — Z86718 Personal history of other venous thrombosis and embolism: Secondary | ICD-10-CM | POA: Insufficient documentation

## 2012-10-21 HISTORY — DX: Personal history of other venous thrombosis and embolism: Z86.718

## 2012-10-21 HISTORY — DX: Other injury of unspecified body region, initial encounter: T14.8XXA

## 2012-10-21 NOTE — Progress Notes (Signed)
Checked in new patient with no financial issues. Mail and phone.

## 2012-10-21 NOTE — Progress Notes (Signed)
[facility] OFFICE CONSULT NOTE  Loreen Freud, [place] [address] [facility] [address] [place] [address] No chief complaint on file.   DIAGNOSIS: History of DVT, no evidence of recurrence Reason for consult: Recent bruising, history of DVT  Molly Klein 52 y.o. female was being referred here because of recent bruising. She is a very poor historian. She cannot recall when she was diagnosed with DVT. According to her, several years ago, she presented with some process in the right leg and none when some imaging study and was told she had a blood clot. She cannot recall how long ago. I tried to review her records dated back to 2010 and notice that she has been a blood thinner around that time for almost several years up until about 2013. She denies any bleeding complication while on blood thinner. There were no family history of blood clot. She has been pregnant before but never carry it to full term. She had a miscarriage. According to her, at the time when she was diagnosed with blood clots, she was on his surgery replacement treatment. She denies any trauma to her leg at the time of diagnosis. No long distance traveled. According to her she was told she might have an inherited disorder. Recently, approximately 2 weeks ago, she strained her back. She thought she might have pulled muscle. She was prescribed naproxen. MEDICAL HISTORY:  Past Medical History  Diagnosis Date  . Asthma   . GERD (gastroesophageal reflux disease)   . Hepatitis C   . DVT (deep venous thrombosis)   . Nipple discharge     right breast  . Neuromuscular disorder     neuropathy lt arm  . OSA (obstructive sleep apnea)     SURGICAL HISTORY: Past Surgical History  Procedure Laterality Date  . Cholecystectomy    . Pilonidal cyst excision    . Dilation and curettage of uterus    . Tonsillectomy    . Abdominal hysterectomy    . Fracture surgery      fx lt arm  . Breast biopsy   02/06/2011    Procedure: BREAST BIOPSY;  Surgeon: Velora Heckler, MD;  Location: [facility];  Service: General;  Laterality: Right;  right breast biopsy    SOCIAL HISTORY: History   Social History  . Marital Status: Married    Spouse Name: N/A    Number of Children: N/A  . Years of Education: N/A   Occupational History  . retired    Social History Main Topics  . Smoking status: Former Smoker -- 5 years    Types: Cigarettes    Quit date: 02/03/1975  . Smokeless tobacco: Never Used     Comment: 1 pack per week  . Alcohol Use: No  . Drug Use: No  . Sexual Activity: Yes    Partners: Male   Other Topics Concern  . Not on file   Social History Narrative   Exercise-- 2-3 days a week    FAMILY HISTORY: Family History  Problem Relation Age of Onset  . Rectal cancer Maternal Grandmother   . Cancer Sister     pt unaware of what kind  . Cancer Maternal Grandmother     breast  . Heart attack Mother     mi in 74s  . Alcohol abuse Mother     cirrhosis of liver  . Heart attack Father     MI in 54s    ALLERGIES:  is allergic to codeine; interferon beta-1a; and sulfonamide derivatives.  MEDICATIONS Current outpatient prescriptions:albuterol (PROVENTIL HFA;VENTOLIN HFA) 108 (90 BASE) MCG/ACT inhaler, Inhale 2 puffs into the lungs 4 (four) times daily as needed. Shortness of breath, Disp: 1 Inhaler, Rfl: 6;  albuterol (PROVENTIL) (2.5 MG/3ML) 0.083% nebulizer solution, INHALE 1 VIAL BY NEBULIZER AS DIRECTED, Disp: 75 mL, Rfl: 0 cyclobenzaprine (FLEXERIL) 10 MG tablet, Take 1 tablet (10 mg total) by mouth 3 (three) times daily as needed for muscle spasms., Disp: 30 tablet, Rfl: 0;  esomeprazole (NEXIUM) 40 MG capsule, Take 1 capsule (40 mg total) by mouth daily., Disp: 30 capsule, Rfl: 3;  gabapentin (NEURONTIN) 300 MG capsule, Take 1 capsule (300 mg total) by mouth every evening., Disp: 90 capsule, Rfl: 0 naproxen (NAPROSYN) 500 MG tablet, Take 1 tablet (500 mg total) by  mouth 2 (two) times daily with a meal., Disp: 60 tablet, Rfl: 0  REVIEW OF SYSTEMS:   Constitutional: Denies fevers, chills or abnormal weight loss Eyes: Denies blurriness of vision Ears, nose, mouth, throat, and face: Denies mucositis or sore throat Respiratory: Denies cough, dyspnea or wheezes Cardiovascular: Denies palpitation, chest discomfort or lower extremity swelling Gastrointestinal:  Denies nausea, heartburn or change in bowel habits Skin: Denies abnormal skin rashes. She did complain of some reason bruises in her legs. Lymphatics: Denies new lymphadenopathy or easy bruising Neurological:Denies numbness, tingling or new weaknesses Behavioral/Psych: Mood is stable, no new changes  All other systems were reviewed with the patient and are negative.  PHYSICAL EXAMINATION: ECOG PERFORMANCE STATUS: 1 - Symptomatic but completely ambulatory  Filed Vitals:   10/21/12 1050  BP: 147/105  Pulse: 91  Temp: 97.3 F (36.3 C)  Resp: 20   Filed Weights   10/21/12 1050  Weight: 236 lb 14.4 oz (107.457 kg)    GENERAL:alert, no distress and comfortable SKIN: skin color, texture, turgor are normal, no rashes or significant lesions up out from the bruises as described above EYES: normal, Conjunctiva are pink and non-injected, sclera clear OROPHARYNX:no exudate, no erythema and lips, buccal mucosa, and tongue normal  NECK: supple, thyroid normal size, non-tender, without nodularity LYMPH:  no palpable lymphadenopathy in the cervical, axillary or inguinal LUNGS: clear to auscultation and percussion with normal breathing effort HEART: regular rate & rhythm and no murmurs and no lower extremity edema ABDOMEN:abdomen soft, non-tender and normal bowel sounds Musculoskeletal:no cyanosis of digits and no clubbing  NEURO: alert & oriented x 3 with fluent speech, no focal motor/sensory deficits  LABORATORY DATA:  I have reviewed the data as listed  ASSESSMENT: Remote history of DVT, no  evidence of recurrence   PLAN:  #1 history of DVT The patient is concerned that some of the bruises that developed recently was related to another recurrence of blood clot. I reassured the patient by my examination I do not think she has a recurrence. Rather, her bruising is likely unrelated. #2 recent bruising After reviewing her medication list, I suspect her bruising is related to naproxen. I recommend she stop naproxen and switch to Tylenol as needed. The patient was told to take Tylenol due to history of hepatitis C. I was informed that her hepatitis C has been treated and the most recent liver function tests were within normal limits. I recommend reduced dose Tylenol to take as needed for to help with her recent back pain.  All questions were answered. The patient knows to call the clinic with any problems, questions or concerns. We can certainly see the patient  much sooner if necessary. No barriers to learning was detected.  The patient and plan discussed with [facility], Dinita Migliaccio and she is in agreement with the aforementioned.  I spent 25 minutes counseling the patient face to face. The total time spent in the appointment was 40 minutes and more than 50% was on counseling.     Lawernce Earll, MD 10/21/2012 1:20 PM

## 2012-11-11 ENCOUNTER — Ambulatory Visit: Payer: Medicare HMO | Admitting: Pulmonary Disease

## 2012-12-27 ENCOUNTER — Ambulatory Visit: Payer: Medicare HMO | Admitting: Pulmonary Disease

## 2013-04-05 ENCOUNTER — Encounter: Payer: Self-pay | Admitting: Nurse Practitioner

## 2013-04-05 ENCOUNTER — Other Ambulatory Visit: Payer: Self-pay | Admitting: *Deleted

## 2013-04-05 ENCOUNTER — Ambulatory Visit (INDEPENDENT_AMBULATORY_CARE_PROVIDER_SITE_OTHER): Payer: Medicare HMO | Admitting: Nurse Practitioner

## 2013-04-05 VITALS — BP 120/84 | HR 88 | Temp 98.3°F | Ht 63.0 in | Wt 226.2 lb

## 2013-04-05 DIAGNOSIS — R197 Diarrhea, unspecified: Secondary | ICD-10-CM

## 2013-04-05 MED ORDER — ESOMEPRAZOLE MAGNESIUM 40 MG PO CPDR: 40.0000 mg | DELAYED_RELEASE_CAPSULE | Freq: Every day | ORAL | Status: DC

## 2013-04-05 MED ORDER — GABAPENTIN 300 MG PO CAPS: 300.0000 mg | ORAL_CAPSULE | Freq: Every evening | ORAL | Status: DC

## 2013-04-05 MED ORDER — LOPERAMIDE HCL 2 MG PO CAPS: ORAL_CAPSULE | ORAL | Status: DC

## 2013-04-05 NOTE — Patient Instructions (Signed)
Start imodium as prescribed. Please sip fluids every 5 minutes until you have drank 64 oz. Then sip every hour while awake until diarrhea stops. You can make your own hydration solution at home (see below) or you can drink pedialyte or gatorade. Avoid milk products (creamy soups, pudding, etc.) Eat bland diet of eggs, toast, apple sauce, bananas, baked or mashed potatotoes, soups with clear broth (chicken noodle, vegetable, veg beef, etc.) for few days. If you develop nausea, abdominal pain or diarrhea does not resolve please let us know or go to ER.  Diarrhea Diarrhea is frequent loose and watery bowel movements. It can cause you to feel weak and dehydrated. Dehydration can cause you to become tired and thirsty, have a dry mouth, and have decreased urination that often is dark yellow. Diarrhea is a sign of another problem, most often an infection that will not last long. In most cases, diarrhea typically lasts 2 3 days. However, it can last longer if it is a sign of something more serious. It is important to treat your diarrhea as directed by your caregive to lessen or prevent future episodes of diarrhea. CAUSES  Some common causes include:  Gastrointestinal infections caused by viruses, bacteria, or parasites.  Food poisoning or food allergies.  Certain medicines, such as antibiotics, chemotherapy, and laxatives.  Artificial sweeteners and fructose.  Digestive disorders. HOME CARE INSTRUCTIONS  Ensure adequate fluid intake (hydration): have 1 cup (8 oz) of fluid for each diarrhea episode. Avoid fluids that contain simple sugars or sports drinks, fruit juices, whole milk products, and sodas. Your urine should be clear or pale yellow if you are drinking enough fluids. Hydrate with an oral rehydration solution that you can purchase at pharmacies, retail stores, and online. You can prepare an oral rehydration solution at home by mixing the following ingredients together:    tsp table salt.   tsp  baking soda.   tsp salt substitute containing potassium chloride.  1  tablespoons sugar.  1 L (34 oz) of water.  Certain foods and beverages may increase the speed at which food moves through the gastrointestinal (GI) tract. These foods and beverages should be avoided and include:  Caffeinated and alcoholic beverages.  High-fiber foods, such as raw fruits and vegetables, nuts, seeds, and whole grain breads and cereals.  Foods and beverages sweetened with sugar alcohols, such as xylitol, sorbitol, and mannitol.  Some foods may be well tolerated and may help thicken stool including:  Starchy foods, such as rice, toast, pasta, low-sugar cereal, oatmeal, grits, baked potatoes, crackers, and bagels.  Bananas.  Applesauce.  Add probiotic-rich foods to help increase healthy bacteria in the GI tract, such as yogurt and fermented milk products.  Wash your hands well after each diarrhea episode.  Only take over-the-counter or prescription medicines as directed by your caregiver.  Take a warm bath to relieve any burning or pain from frequent diarrhea episodes. SEEK IMMEDIATE MEDICAL CARE IF:   You are unable to keep fluids down.  You have persistent vomiting.  You have blood in your stool, or your stools are black and tarry.  You do not urinate in 6 8 hours, or there is only a small amount of very dark urine.  You have abdominal pain that increases or localizes.  You have weakness, dizziness, confusion, or lightheadedness.  You have a severe headache.  Your diarrhea gets worse or does not get better.  You have a fever or persistent symptoms for more than 2 3 days.  You have a fever and your symptoms suddenly get worse. MAKE SURE YOU:   Understand these instructions.  Will watch your condition.  Will get help right away if you are not doing well or get worse. Document Released: 01/09/2002 Document Revised: 01/06/2012 Document Reviewed: 09/27/2011 [facility] Patient  Information 2014 [place], [place].

## 2013-04-05 NOTE — Telephone Encounter (Signed)
Molly Klein , this is [name]'s patient. Miss your smiling face & clinical skills. [employer]

## 2013-04-05 NOTE — Progress Notes (Signed)
Pre visit review using our clinic review tool, if applicable. No additional management support is needed unless otherwise documented below in the visit note. 

## 2013-04-05 NOTE — Progress Notes (Signed)
   Subjective:    Patient ID: Molly Klein, female    DOB: January 25, 1961, 53 y.o.   MRN: [id number]  Diarrhea  This is a new problem. The current episode started in the past 7 days. The problem occurs 5 to 10 times per day. The problem has been gradually worsening. The stool consistency is described as watery (watery in last 2 days ). The patient states that diarrhea awakens her from sleep. Associated symptoms include chills (5 da, resolved). Pertinent negatives include no abdominal pain, bloating, coughing, fever, headaches, URI or vomiting. Nothing aggravates the symptoms. There are no known risk factors. She has tried nothing for the symptoms. The treatment provided no relief.      Review of Systems  Constitutional: Positive for chills (5 da, resolved) and appetite change (decreased in last few days). Negative for fever and activity change.  HENT: Positive for congestion (nasal, mild). Negative for ear pain, postnasal drip and sore throat.   Respiratory: Negative for cough, shortness of breath and wheezing.   Gastrointestinal: Positive for diarrhea. Negative for nausea, vomiting, abdominal pain and bloating.  Neurological: Negative for headaches.  Hematological: Negative for adenopathy.       Objective:   Physical Exam  Vitals reviewed. Constitutional: She is oriented to person, place, and time. She appears well-developed and well-nourished. No distress.  Looks tired  HENT:  Head: Normocephalic and atraumatic.  Eyes: Conjunctivae are normal. Right eye exhibits no discharge. Left eye exhibits no discharge.  Cardiovascular: Normal rate.   No murmur heard. Pulmonary/Chest: Effort normal.  Abdominal: Soft. She exhibits no distension and no mass. There is no tenderness. There is no rebound and no guarding.  Neurological: She is alert and oriented to person, place, and time.  Poor historian-initially states diarrhea for 2 d, then realized in conversation, duration is 5d  Skin: Skin  is warm and dry.  Psychiatric: She has a normal mood and affect. Her behavior is normal. Thought content normal.          Assessment & Plan:  1. Diarrhea 5d, watery, 1 episode incontinence - loperamide (IMODIUM) 2 MG capsule; Take 2T po, then 1T po after each loose stool. Do not take more than 8T in 24 hours.  Dispense: 45 capsule; Refill: 0 See pt instructions. If no improvement in few days, re-eval with stool tests, CBC, abd [place].

## 2013-04-05 NOTE — Telephone Encounter (Signed)
Requesting Gabapentin 300mg  Take 1 capsule by mouth every evening. Last refill:06-10-11;#90 Last OV:09-28-12 Please advise.//AB/CMA

## 2013-04-06 ENCOUNTER — Telehealth: Payer: Self-pay | Admitting: *Deleted

## 2013-04-06 NOTE — Telephone Encounter (Signed)
Rx for neurontin faxed to [facility], pt notified.

## 2013-05-15 ENCOUNTER — Encounter: Payer: Self-pay | Admitting: Physician Assistant

## 2013-05-15 ENCOUNTER — Ambulatory Visit (INDEPENDENT_AMBULATORY_CARE_PROVIDER_SITE_OTHER): Payer: Medicare HMO | Admitting: Physician Assistant

## 2013-05-15 VITALS — BP 124/86 | HR 78 | Temp 98.0°F | Resp 18 | Ht 63.0 in | Wt 227.1 lb

## 2013-05-15 DIAGNOSIS — J209 Acute bronchitis, unspecified: Secondary | ICD-10-CM

## 2013-05-15 DIAGNOSIS — J45901 Unspecified asthma with (acute) exacerbation: Secondary | ICD-10-CM

## 2013-05-15 MED ORDER — ALBUTEROL SULFATE (2.5 MG/3ML) 0.083% IN NEBU: INHALATION_SOLUTION | RESPIRATORY_TRACT | Status: DC

## 2013-05-15 MED ORDER — PREDNISONE 20 MG PO TABS: 40.0000 mg | ORAL_TABLET | Freq: Every day | ORAL | Status: DC

## 2013-05-15 MED ORDER — AZITHROMYCIN 250 MG PO TABS: ORAL_TABLET | ORAL | Status: DC

## 2013-05-15 MED ORDER — BENZONATATE 100 MG PO CAPS: 100.0000 mg | ORAL_CAPSULE | Freq: Two times a day (BID) | ORAL | Status: DC | PRN

## 2013-05-15 MED ORDER — ALBUTEROL SULFATE HFA 108 (90 BASE) MCG/ACT IN AERS: 2.0000 | INHALATION_SPRAY | Freq: Four times a day (QID) | RESPIRATORY_TRACT | Status: DC | PRN

## 2013-05-15 NOTE — Patient Instructions (Signed)
Please take Azithromycin as directed.  Use Tessalon Perles for cough.  Take Prednisone daily for 5 days as directed.  Increase fluid intake.  Rest.  Saline nasal spray.  Mucinex.  Use Albuterol inhaler or nebulizer every 4-6 hours if needed for wheeze.  Call or return to clinic if symptoms are not improving.

## 2013-05-15 NOTE — Assessment & Plan Note (Signed)
Rx Azithromycin.  Rx [employer]  Rx Prednisone 40 mg burst.  Increase fluid intake.  Rest.  Saline nasal spray.  Mucinex.  Humidifier in bedroom.  Refilled Albuterol.

## 2013-05-15 NOTE — Progress Notes (Signed)
Patient presents to clinic today c/o 1 week of sinus pressure, sinus pain, ear pain, fever and nonproductive cough.  Patient with history of asthma.  Is out of her albuterol.  Endorses wheezing and chest tightness but denies SOB or pleuritic chest pain.  Denies recent travel or sick contact.  Past Medical History  Diagnosis Date  . Asthma   . GERD (gastroesophageal reflux disease)   . Hepatitis C   . DVT (deep venous thrombosis)   . Nipple discharge     right breast  . Neuromuscular disorder     neuropathy lt arm  . OSA (obstructive sleep apnea)   . Bruising 10/21/2012  . History of DVT of lower extremity 10/21/2012    Current Outpatient Prescriptions on File Prior to Visit  Medication Sig Dispense Refill  . esomeprazole (NEXIUM) 40 MG capsule Take 1 capsule (40 mg total) by mouth daily.  30 capsule  5  . gabapentin (NEURONTIN) 300 MG capsule Take 1 capsule (300 mg total) by mouth every evening.  90 capsule  0  . loperamide (IMODIUM) 2 MG capsule Take 2T po, then 1T po after each loose stool. Do not take more than 8T in 24 hours.  45 capsule  0   No current facility-administered medications on file prior to visit.    Allergies  Allergen Reactions  . Codeine Nausea And Vomiting  . Interferon Beta-1a   . Sulfonamide Derivatives Nausea Only    Family History  Problem Relation Age of Onset  . Rectal cancer Maternal Grandmother   . Cancer Sister     pt unaware of what kind  . Cancer Maternal Grandmother     breast  . Heart attack Mother     mi in 51s  . Alcohol abuse Mother     cirrhosis of liver  . Heart attack Father     MI in 64s    History   Social History  . Marital Status: Married    Spouse Name: N/A    Number of Children: N/A  . Years of Education: N/A   Occupational History  . retired    Social History Main Topics  . Smoking status: Former Smoker -- 5 years    Types: Cigarettes    Quit date: 02/03/1975  . Smokeless tobacco: Never Used     Comment: 1  pack per week  . Alcohol Use: No  . Drug Use: No  . Sexual Activity: Yes    Partners: Male   Other Topics Concern  . None   Social History Narrative   Exercise-- 2-3 days a week   Review of Systems - See HPI.  All other ROS are negative.  BP 124/86  Pulse 78  Temp(Src) 98 F (36.7 C) (Oral)  Resp 18  Ht 5\' 3"  (1.6 m)  Wt 227 lb 2 oz (103.023 kg)  BMI 40.24 kg/m2  SpO2 96%  Physical Exam  Vitals reviewed. Constitutional: She is oriented to person, place, and time and well-developed, well-nourished, and in no distress.  HENT:  Head: Normocephalic and atraumatic.  Right Ear: External ear normal.  Left Ear: External ear normal.  Nose: Nose normal.  Mouth/Throat: Oropharynx is clear and moist. No oropharyngeal exudate.  + TTP of maxillary sinuses.  L TM within normal limits.  R TM erythematous.  Eyes: Conjunctivae are normal. Pupils are equal, round, and reactive to light.  Neck: Neck supple.  Cardiovascular: Normal rate, regular rhythm, normal heart sounds and intact distal pulses.  Pulmonary/Chest: Effort normal. No respiratory distress. She has wheezes. She has no rales. She exhibits no tenderness.  Lymphadenopathy:    She has no cervical adenopathy.  Neurological: She is alert and oriented to person, place, and time.  Skin: Skin is warm and dry. No rash noted.  Psychiatric: Affect normal.   Assessment/Plan: Acute bronchitis with asthma with acute exacerbation Rx Azithromycin.  Rx [employer]  Rx Prednisone 40 mg burst.  Increase fluid intake.  Rest.  Saline nasal spray.  Mucinex.  Humidifier in bedroom.  Refilled Albuterol.

## 2013-05-24 ENCOUNTER — Telehealth: Payer: Self-pay

## 2013-05-24 NOTE — Telephone Encounter (Signed)
Patient called and stated that they are not feeling better. Still has congestion and stuffy head.  Patient had not taken Mucinex as directed or used the humidifier. Advised patient that the z-pak stays in the system for 7-10 days after the last dose is taken. Advised to take the Mucinex with lots of fluid and use a humidifier at night as directed per provider order. Also, let us know if she is not seeing improvement by Friday. Patient agrees with plan.

## 2013-06-10 IMAGING — CR DG FOOT COMPLETE 3+V*R*
3 series · 3 of 3 positions shown · non-contrast
Comparison: 02/08/2008

CLINICAL DATA: Diffuse right foot pain

RIGHT FOOT COMPLETE - 3+ VIEW

[x foot ap right]
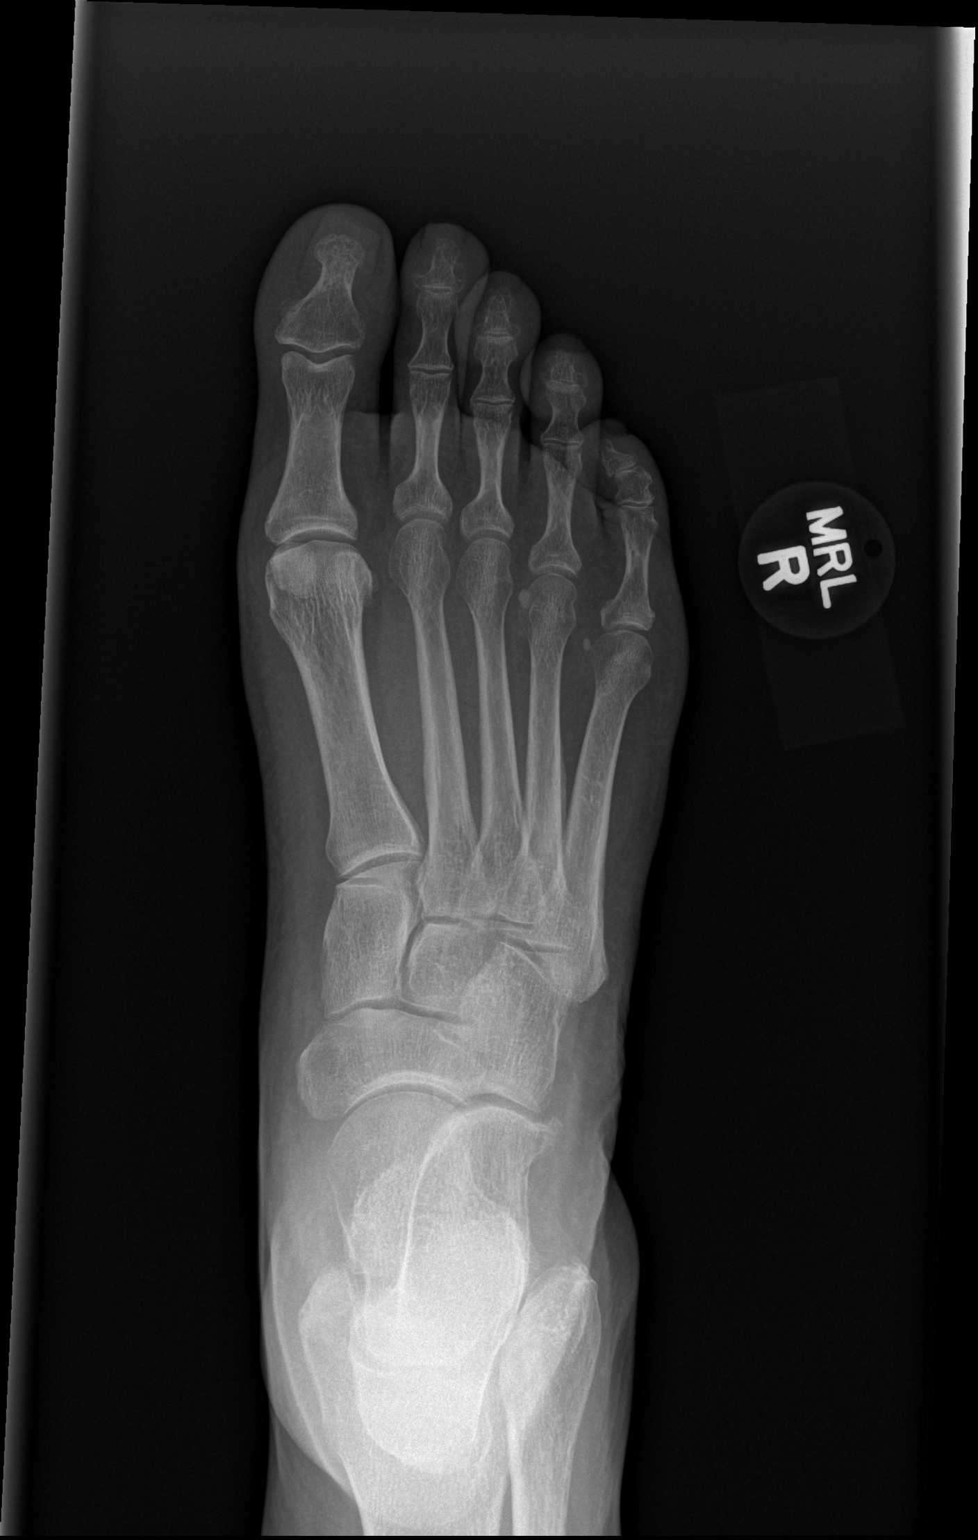

[x foot obl right]
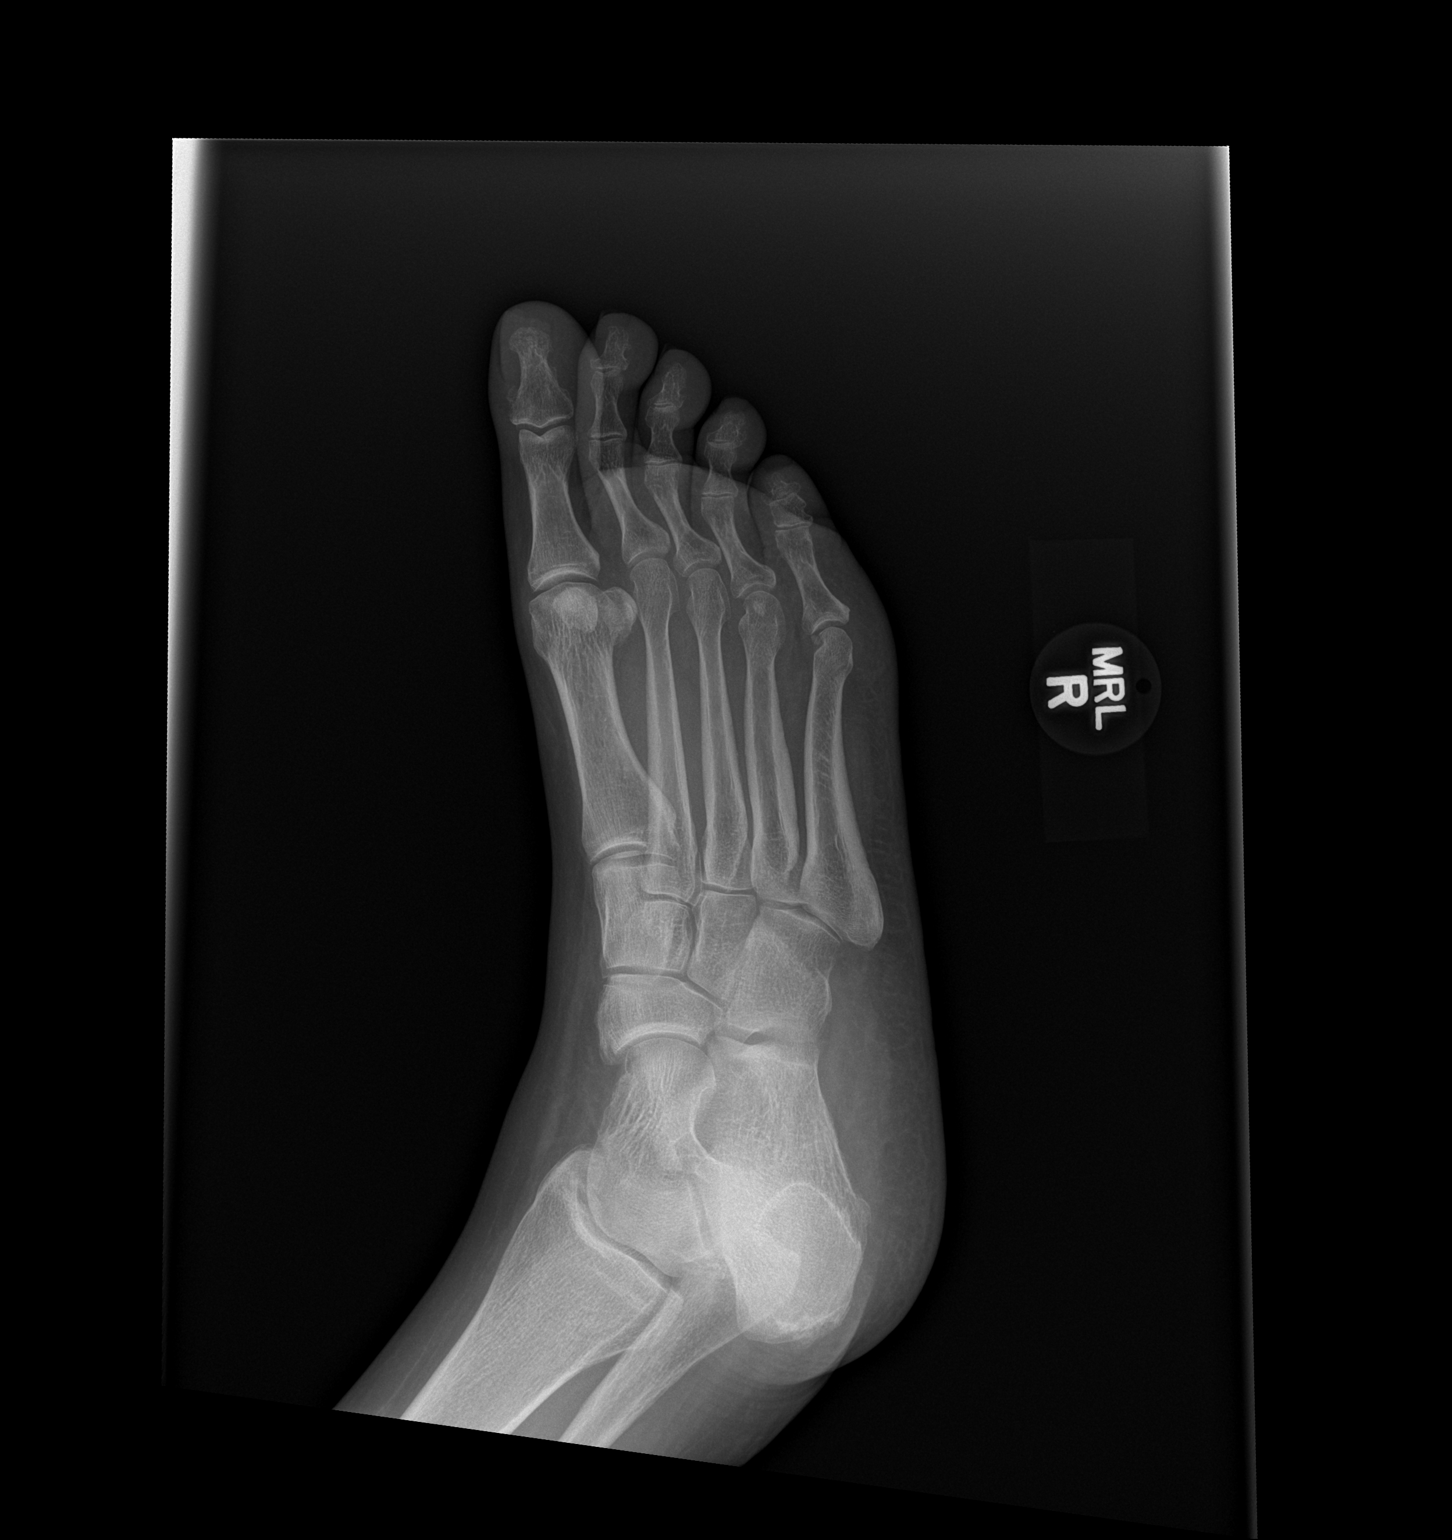

[x foot lat right]
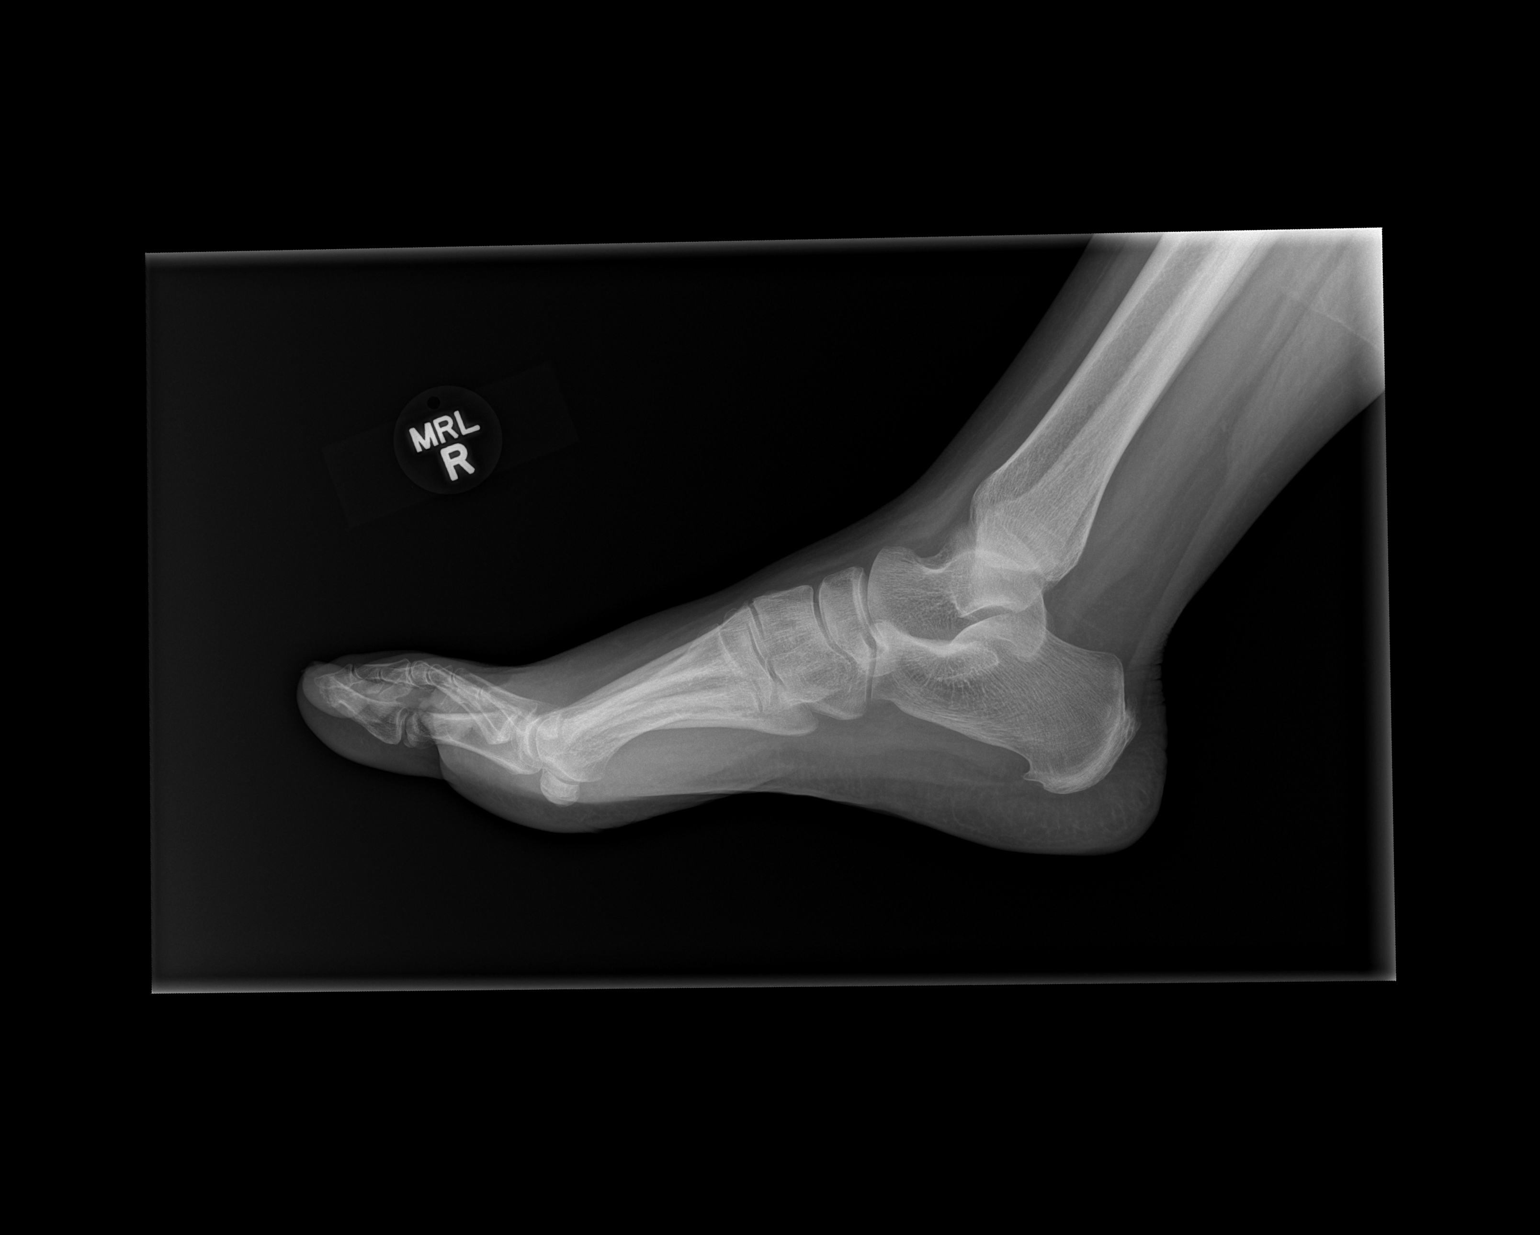

[3 of 3 positions shown; findings below may reference images not displayed]

FINDINGS: Osseous demineralization.
Joint spaces preserved.
No acute fracture, dislocation, or bone destruction.
Plantar and Achilles insertion calcaneal spurs.
IMPRESSION: No acute osseous abnormalities.

## 2013-10-24 ENCOUNTER — Telehealth: Payer: Self-pay

## 2013-10-24 NOTE — Telephone Encounter (Signed)
LVM for pt to call back and schedule AWV.   *schedule with Eula Listen, PA unless pt insist on PCP doing AWV

## 2013-12-04 ENCOUNTER — Encounter: Payer: Self-pay | Admitting: Physician Assistant

## 2013-12-13 ENCOUNTER — Other Ambulatory Visit: Payer: Self-pay | Admitting: Family Medicine

## 2013-12-13 ENCOUNTER — Ambulatory Visit (HOSPITAL_BASED_OUTPATIENT_CLINIC_OR_DEPARTMENT_OTHER)
Admission: RE | Admit: 2013-12-13 | Discharge: 2013-12-13 | Disposition: A | Discharge status: Home / Self Care | Payer: Medicare HMO | Source: Ambulatory Visit | Attending: Medical | Admitting: Medical

## 2013-12-13 ENCOUNTER — Ambulatory Visit (INDEPENDENT_AMBULATORY_CARE_PROVIDER_SITE_OTHER): Payer: Medicare HMO | Admitting: Medical

## 2013-12-13 ENCOUNTER — Encounter: Payer: Self-pay | Admitting: Medical

## 2013-12-13 VITALS — BP 134/86 | HR 81 | Temp 97.9°F | Ht 63.0 in | Wt 230.2 lb

## 2013-12-13 DIAGNOSIS — S8012XA Contusion of left lower leg, initial encounter: Secondary | ICD-10-CM | POA: Diagnosis present

## 2013-12-13 DIAGNOSIS — Y9339 Activity, other involving climbing, rappelling and jumping off: Secondary | ICD-10-CM | POA: Diagnosis not present

## 2013-12-13 DIAGNOSIS — M25562 Pain in left knee: Secondary | ICD-10-CM

## 2013-12-13 DIAGNOSIS — Z1231 Encounter for screening mammogram for malignant neoplasm of breast: Secondary | ICD-10-CM

## 2013-12-13 MED ORDER — DICLOFENAC SODIUM 75 MG PO TBEC: 75.0000 mg | DELAYED_RELEASE_TABLET | Freq: Two times a day (BID) | ORAL | Status: DC
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
## 2013-12-13 NOTE — Progress Notes (Signed)
Pre visit review using our clinic review tool, if applicable. No additional management support is needed unless otherwise documented below in the visit note. 
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
## 2013-12-19 ENCOUNTER — Ambulatory Visit (HOSPITAL_BASED_OUTPATIENT_CLINIC_OR_DEPARTMENT_OTHER)
Admission: RE | Admit: 2013-12-19 | Discharge: 2013-12-19 | Disposition: A | Discharge status: Home / Self Care | Payer: Medicare HMO | Source: Ambulatory Visit | Attending: Family Medicine | Admitting: Family Medicine

## 2013-12-19 ENCOUNTER — Inpatient Hospital Stay (HOSPITAL_BASED_OUTPATIENT_CLINIC_OR_DEPARTMENT_OTHER): Admission: RE | Admit: 2013-12-19 | Payer: Medicare HMO | Source: Ambulatory Visit

## 2013-12-19 DIAGNOSIS — I2510 Atherosclerotic heart disease of native coronary artery without angina pectoris: Secondary | ICD-10-CM | POA: Diagnosis not present

## 2013-12-19 DIAGNOSIS — Z1231 Encounter for screening mammogram for malignant neoplasm of breast: Secondary | ICD-10-CM | POA: Insufficient documentation

## 2013-12-20 ENCOUNTER — Ambulatory Visit (HOSPITAL_BASED_OUTPATIENT_CLINIC_OR_DEPARTMENT_OTHER)
Admission: RE | Admit: 2013-12-20 | Discharge: 2013-12-20 | Disposition: A | Discharge status: Home / Self Care | Payer: Medicare HMO | Source: Ambulatory Visit | Attending: Medical | Admitting: Medical

## 2013-12-20 ENCOUNTER — Telehealth: Payer: Self-pay | Admitting: Medical

## 2013-12-20 ENCOUNTER — Encounter: Payer: Self-pay | Admitting: Medical

## 2013-12-20 ENCOUNTER — Ambulatory Visit (INDEPENDENT_AMBULATORY_CARE_PROVIDER_SITE_OTHER): Payer: Medicare HMO | Admitting: Medical

## 2013-12-20 VITALS — BP 120/88 | HR 71 | Temp 98.2°F | Ht 63.0 in | Wt 232.0 lb

## 2013-12-20 DIAGNOSIS — S8012XD Contusion of left lower leg, subsequent encounter: Secondary | ICD-10-CM

## 2013-12-20 DIAGNOSIS — M79609 Pain in unspecified limb: Secondary | ICD-10-CM

## 2013-12-20 DIAGNOSIS — X58XXXA Exposure to other specified factors, initial encounter: Secondary | ICD-10-CM | POA: Insufficient documentation

## 2013-12-20 DIAGNOSIS — M79662 Pain in left lower leg: Secondary | ICD-10-CM | POA: Insufficient documentation

## 2013-12-20 DIAGNOSIS — S8012XA Contusion of left lower leg, initial encounter: Secondary | ICD-10-CM | POA: Diagnosis not present

## 2013-12-20 MED ORDER — RANITIDINE HCL 150 MG PO TABS: 150.0000 mg | ORAL_TABLET | Freq: Two times a day (BID) | ORAL | Status: DC

## 2013-12-20 NOTE — Telephone Encounter (Signed)
Advised pt on negative doppler.

## 2013-12-20 NOTE — Progress Notes (Signed)
Pre visit review using our clinic review tool, if applicable. No additional management support is needed unless otherwise documented below in the visit note. 

## 2013-12-20 NOTE — Patient Instructions (Signed)
You appear to be improving slowly from the calf contusions. However, with your recent trauma to lower extremity, faint popliteal pain and history of dvt years ago. I do want to get a stat lower extremity doppler today.Appointment is at 10:30 am downstairs in radiology. We will call you with the results today.  Follow up as needed.(if doppler negative)

## 2013-12-20 NOTE — Progress Notes (Signed)
Subjective:    Patient ID: ADDYSYN FERN, female    DOB: 31-Aug-1960, 53 y.o.   MRN: [id number]  HPI  Pt in with less pain to bruised area on her left lateral calf. The area of left calf is much less bruised now. Patient does not report any popliteal pain on daily activities(but some pain on exam). No sob or wheezing. No chest pain.  Pt has also some history of reflux. Pt on nexium Pt is taking her husband med which works better for her. She request rx of rantidine.  Past Medical History  Diagnosis Date  . Asthma   . GERD (gastroesophageal reflux disease)   . Hepatitis C   . DVT (deep venous thrombosis)   . Nipple discharge     right breast  . Neuromuscular disorder     neuropathy lt arm  . OSA (obstructive sleep apnea)   . Bruising 10/21/2012  . History of DVT of lower extremity 10/21/2012    History   Social History  . Marital Status: Married    Spouse Name: N/A    Number of Children: N/A  . Years of Education: N/A   Occupational History  . retired    Social History Main Topics  . Smoking status: Former Smoker -- 5 years    Types: Cigarettes    Quit date: 02/03/1975  . Smokeless tobacco: Never Used     Comment: 1 pack per week  . Alcohol Use: No  . Drug Use: No  . Sexual Activity:    Partners: Male   Other Topics Concern  . Not on file   Social History Narrative   Exercise-- 2-3 days a week    Past Surgical History  Procedure Laterality Date  . Cholecystectomy    . Pilonidal cyst excision    . Dilation and curettage of uterus    . Tonsillectomy    . Abdominal hysterectomy    . Fracture surgery      fx lt arm  . Breast biopsy  02/06/2011    Procedure: BREAST BIOPSY;  Surgeon: Earnstine Regal, MD;  Location: [facility];  Service: General;  Laterality: Right;  right breast biopsy    Family History  Problem Relation Age of Onset  . Rectal cancer Maternal Grandmother   . Cancer Sister     pt unaware of what kind  . Cancer Maternal  Grandmother     breast  . Heart attack Mother     mi in 8s  . Alcohol abuse Mother     cirrhosis of liver  . Heart attack Father     MI in 11s    Allergies  Allergen Reactions  . Codeine Nausea And Vomiting  . Interferon Beta-1a   . Sulfonamide Derivatives Nausea Only    Current Outpatient Prescriptions on File Prior to Visit  Medication Sig Dispense Refill  . albuterol (PROVENTIL HFA;VENTOLIN HFA) 108 (90 BASE) MCG/ACT inhaler Inhale 2 puffs into the lungs 4 (four) times daily as needed. Shortness of breath 1 Inhaler 6  . albuterol (PROVENTIL) (2.5 MG/3ML) 0.083% nebulizer solution INHALE 1 VIAL BY NEBULIZER AS DIRECTED 75 mL 0  . azithromycin (ZITHROMAX Z-PAK) 250 MG tablet Take 2 tablets (500 mg) on  Day 1,  followed by 1 tablet (250 mg) once daily on Days 2 through 5. 6 each 0  . benzonatate (TESSALON) 100 MG capsule Take 1 capsule (100 mg total) by mouth 2 (two) times daily as needed for cough.  20 capsule 0  . diclofenac (VOLTAREN) 75 MG EC tablet Take 1 tablet (75 mg total) by mouth 2 (two) times daily. 20 tablet 0  . esomeprazole (NEXIUM) 40 MG capsule Take 1 capsule (40 mg total) by mouth daily. 30 capsule 5  . gabapentin (NEURONTIN) 300 MG capsule Take 1 capsule (300 mg total) by mouth every evening. 90 capsule 0  . loperamide (IMODIUM) 2 MG capsule Take 2T po, then 1T po after each loose stool. Do not take more than 8T in 24 hours. 45 capsule 0  . predniSONE (DELTASONE) 20 MG tablet Take 2 tablets (40 mg total) by mouth daily with breakfast. For 5 Days. 10 tablet 0   No current facility-administered medications on file prior to visit.    BP 120/88 mmHg  Pulse 71  Temp(Src) 98.2 F (36.8 C) (Oral)  Ht 5\' 3"  (1.6 m)  Wt 232 lb (105.235 kg)  BMI 41.11 kg/m2  SpO2 99%      Review of Systems  Constitutional: Negative for fever, chills and fatigue.  Respiratory: Negative for cough, chest tightness, shortness of breath and wheezing.   Cardiovascular: Negative for  chest pain and palpitations.  Gastrointestinal: Positive for abdominal pain. Negative for nausea, vomiting, diarrhea, constipation, blood in stool, abdominal distention, anal bleeding and rectal pain.       On and off reflux. Does a lot better with husband rantidine then nexium. She request a switch. No pain/reflux during exam.  Musculoskeletal: Negative for back pain, joint swelling and neck pain.        Lt faint calf pain.   Skin:       Bruising to left calf. But better.  Hematological: Negative for adenopathy. Does not bruise/bleed easily.       Objective:   Physical Exam   Lt lower ext- faint bruising left calf 2 areas. Smaller now and less bruised. No calf swelling. Faint minimal homans sign left side.       Assessment & Plan:
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
## 2013-12-20 NOTE — Assessment & Plan Note (Addendum)
Areas are improving in terms of soreness and size of bruising. She does not need further follow up for the contusion itself. Prn follow up for this. Low dose ibuprofen for soreness as well. Can dc rx nsaid (diclofenac)I gave her last visit.

## 2013-12-21 ENCOUNTER — Other Ambulatory Visit: Payer: Self-pay | Admitting: Family Medicine

## 2013-12-21 DIAGNOSIS — R928 Other abnormal and inconclusive findings on diagnostic imaging of breast: Secondary | ICD-10-CM

## 2014-01-05 ENCOUNTER — Ambulatory Visit
Admission: RE | Admit: 2014-01-05 | Discharge: 2014-01-05 | Disposition: A | Discharge status: Home / Self Care | Payer: Medicare HMO | Source: Ambulatory Visit | Attending: Family Medicine | Admitting: Family Medicine

## 2014-01-05 DIAGNOSIS — R928 Other abnormal and inconclusive findings on diagnostic imaging of breast: Secondary | ICD-10-CM

## 2014-03-12 ENCOUNTER — Ambulatory Visit (INDEPENDENT_AMBULATORY_CARE_PROVIDER_SITE_OTHER): Payer: Medicare HMO | Admitting: Family Medicine

## 2014-03-12 ENCOUNTER — Other Ambulatory Visit (HOSPITAL_COMMUNITY)
Admission: RE | Admit: 2014-03-12 | Discharge: 2014-03-12 | Disposition: A | Discharge status: Home / Self Care | Payer: Medicare HMO | Source: Ambulatory Visit | Attending: Family Medicine | Admitting: Family Medicine

## 2014-03-12 ENCOUNTER — Encounter: Payer: Self-pay | Admitting: Family Medicine

## 2014-03-12 VITALS — BP 134/76 | HR 80 | Temp 98.0°F | Ht 63.0 in | Wt 236.6 lb

## 2014-03-12 DIAGNOSIS — Z124 Encounter for screening for malignant neoplasm of cervix: Secondary | ICD-10-CM

## 2014-03-12 DIAGNOSIS — A6000 Herpesviral infection of urogenital system, unspecified: Secondary | ICD-10-CM

## 2014-03-12 DIAGNOSIS — M179 Osteoarthritis of knee, unspecified: Secondary | ICD-10-CM

## 2014-03-12 DIAGNOSIS — Z1151 Encounter for screening for human papillomavirus (HPV): Secondary | ICD-10-CM | POA: Insufficient documentation

## 2014-03-12 DIAGNOSIS — R8290 Unspecified abnormal findings in urine: Secondary | ICD-10-CM

## 2014-03-12 DIAGNOSIS — IMO0002 Reserved for concepts with insufficient information to code with codable children: Secondary | ICD-10-CM

## 2014-03-12 DIAGNOSIS — J4521 Mild intermittent asthma with (acute) exacerbation: Secondary | ICD-10-CM

## 2014-03-12 DIAGNOSIS — M1710 Unilateral primary osteoarthritis, unspecified knee: Secondary | ICD-10-CM

## 2014-03-12 DIAGNOSIS — Z79899 Other long term (current) drug therapy: Secondary | ICD-10-CM

## 2014-03-12 DIAGNOSIS — Z8619 Personal history of other infectious and parasitic diseases: Secondary | ICD-10-CM

## 2014-03-12 DIAGNOSIS — Z0000 Encounter for general adult medical examination without abnormal findings: Secondary | ICD-10-CM

## 2014-03-12 LAB — CBC WITH DIFFERENTIAL/PLATELET
BASOS ABS: 0 10*3/uL (ref 0.0–0.1)
Basophils Relative: 0.7 % (ref 0.0–3.0)
Eosinophils Absolute: 0.1 10*3/uL (ref 0.0–0.7)
Eosinophils Relative: 3.1 % (ref 0.0–5.0)
HEMATOCRIT: 43.4 % (ref 36.0–46.0)
HEMOGLOBIN: 15 g/dL (ref 12.0–15.0)
LYMPHS PCT: 28.7 % (ref 12.0–46.0)
Lymphs Abs: 1.3 10*3/uL (ref 0.7–4.0)
MCHC: 34.5 g/dL (ref 30.0–36.0)
MCV: 85.9 fl (ref 78.0–100.0)
MONOS PCT: 6.6 % (ref 3.0–12.0)
Monocytes Absolute: 0.3 10*3/uL (ref 0.1–1.0)
NEUTROS PCT: 60.9 % (ref 43.0–77.0)
Neutro Abs: 2.8 10*3/uL (ref 1.4–7.7)
Platelets: 206 10*3/uL (ref 150.0–400.0)
RBC: 5.05 Mil/uL (ref 3.87–5.11)
RDW: 13.8 % (ref 11.5–15.5)
WBC: 4.5 10*3/uL (ref 4.0–10.5)

## 2014-03-12 LAB — LIPID PANEL
CHOL/HDL RATIO: 3
Cholesterol: 173 mg/dL (ref 0–200)
HDL: 50 mg/dL (ref >39.00)
LDL CALC: 105 mg/dL — AB (ref 0–99)
NONHDL: 123
Triglycerides: 90 mg/dL (ref 0.0–149.0)
VLDL: 18 mg/dL (ref 0.0–40.0)

## 2014-03-12 LAB — POCT URINALYSIS DIPSTICK
BILIRUBIN UA: NEGATIVE
Glucose, UA: NEGATIVE
LEUKOCYTES UA: NEGATIVE
Nitrite, UA: POSITIVE
PH UA: 6
Protein, UA: NEGATIVE
RBC UA: NEGATIVE
Spec Grav, UA: >=1.03
Urobilinogen, UA: NEGATIVE

## 2014-03-12 LAB — BASIC METABOLIC PANEL
BUN: 14 mg/dL (ref 6–23)
CHLORIDE: 102 meq/L (ref 96–112)
CO2: 28 meq/L (ref 19–32)
Calcium: 9.2 mg/dL (ref 8.4–10.5)
Creatinine, Ser: 0.63 mg/dL (ref 0.40–1.20)
GFR: 125.79 mL/min (ref >60.00)
Glucose, Bld: 97 mg/dL (ref 70–99)
Potassium: 3.7 mEq/L (ref 3.5–5.1)
SODIUM: 138 meq/L (ref 135–145)

## 2014-03-12 LAB — TSH: TSH: 2.33 u[IU]/mL (ref 0.35–4.50)

## 2014-03-12 LAB — HEPATIC FUNCTION PANEL
ALBUMIN: 4.3 g/dL (ref 3.5–5.2)
ALT: 18 U/L (ref 0–35)
AST: 14 U/L (ref 0–37)
Alkaline Phosphatase: 96 U/L (ref 39–117)
BILIRUBIN TOTAL: 0.4 mg/dL (ref 0.2–1.2)
Bilirubin, Direct: 0.1 mg/dL (ref 0.0–0.3)
Total Protein: 7.7 g/dL (ref 6.0–8.3)

## 2014-03-12 MED ORDER — VALACYCLOVIR HCL 1 G PO TABS: 1000.0000 mg | ORAL_TABLET | Freq: Three times a day (TID) | ORAL | Status: DC

## 2014-03-12 MED ORDER — BECLOMETHASONE DIPROPIONATE 40 MCG/ACT IN AERS: 2.0000 | INHALATION_SPRAY | Freq: Two times a day (BID) | RESPIRATORY_TRACT | Status: DC

## 2014-03-12 MED ORDER — DICLOFENAC SODIUM 75 MG PO TBEC: 75.0000 mg | DELAYED_RELEASE_TABLET | Freq: Two times a day (BID) | ORAL | Status: DC

## 2014-03-12 NOTE — Progress Notes (Signed)
Pre visit review using our clinic review tool, if applicable. No additional management support is needed unless otherwise documented below in the visit note. 

## 2014-03-12 NOTE — Progress Notes (Signed)
Subjective:     Molly Klein is a 54 y.o. female and is here for a comprehensive physical exam. The patient reports no problems.  History   Social History  . Marital Status: Married    Spouse Name: N/A    Number of Children: N/A  . Years of Education: N/A   Occupational History  . retired    Social History Main Topics  . Smoking status: Former Smoker -- 5 years    Types: Cigarettes    Quit date: 02/03/1975  . Smokeless tobacco: Never Used     Comment: 1 pack per week  . Alcohol Use: No  . Drug Use: No  . Sexual Activity:    Partners: Male   Other Topics Concern  . Not on file   Social History Narrative   Exercise-- 2-3 days a week   Health Maintenance  Topic Date Due  . TETANUS/TDAP  09/30/2013  . INFLUENZA VACCINE  03/13/2015 (Originally 09/02/2013)  . PAP SMEAR  07/07/2014  . MAMMOGRAM  01/06/2016  . COLONOSCOPY  03/13/2019    The following portions of the patient's history were reviewed and updated as appropriate:  She  has a past medical history of Asthma; GERD (gastroesophageal reflux disease); Hepatitis C; DVT (deep venous thrombosis); Nipple discharge; Neuromuscular disorder; OSA (obstructive sleep apnea); Bruising (10/21/2012); and History of DVT of lower extremity (10/21/2012). She  does not have any pertinent problems on file. She  has past surgical history that includes Cholecystectomy; Pilonidal cyst excision; Dilation and curettage of uterus; Tonsillectomy; Abdominal hysterectomy; Fracture surgery; and Breast biopsy (02/06/2011). Her family history includes Alcohol abuse in her mother; Cancer in her maternal grandmother and sister; Heart attack in her father and mother; Rectal cancer in her maternal grandmother. She  reports that she quit smoking about 39 years ago. Her smoking use included Cigarettes. She quit after 5 years of use. She has never used smokeless tobacco. She reports that she does not drink alcohol or use illicit drugs. She has a current  medication list which includes the following prescription(s): albuterol, albuterol, diclofenac, gabapentin, ranitidine, and valacyclovir. Current Outpatient Prescriptions on File Prior to Visit  Medication Sig Dispense Refill  . albuterol (PROVENTIL HFA;VENTOLIN HFA) 108 (90 BASE) MCG/ACT inhaler Inhale 2 puffs into the lungs 4 (four) times daily as needed. Shortness of breath 1 Inhaler 6  . albuterol (PROVENTIL) (2.5 MG/3ML) 0.083% nebulizer solution INHALE 1 VIAL BY NEBULIZER AS DIRECTED 75 mL 0  . gabapentin (NEURONTIN) 300 MG capsule Take 1 capsule (300 mg total) by mouth every evening. 90 capsule 0  . ranitidine (ZANTAC) 150 MG tablet Take 1 tablet (150 mg total) by mouth 2 (two) times daily. 60 tablet 3   No current facility-administered medications on file prior to visit.   She is allergic to codeine; interferon beta-1a; and sulfonamide derivatives..  Review of Systems Review of Systems  Constitutional: Negative for activity change, appetite change and fatigue.  HENT: Negative for hearing loss, congestion, tinnitus and ear discharge.  dentist q84m Eyes: Negative for visual disturbance (see optho q1y -- vision corrected to 20/20 with glasses).  Respiratory: Negative for cough, chest tightness and shortness of breath.   Cardiovascular: Negative for chest pain, palpitations and leg swelling.  Gastrointestinal: Negative for abdominal pain, diarrhea, constipation and abdominal distention.  Genitourinary: Negative for urgency, frequency, decreased urine volume and difficulty urinating.  Musculoskeletal: Negative for back pain, arthralgias and gait problem.  Skin: Negative for color change, pallor and rash.  Neurological:  Negative for dizziness, light-headedness, numbness and headaches.  Hematological: Negative for adenopathy. Does not bruise/bleed easily.  Psychiatric/Behavioral: Negative for suicidal ideas, confusion, sleep disturbance, self-injury, dysphoric mood, decreased concentration  and agitation.       Objective:    BP 134/76 mmHg  Pulse 80  Temp(Src) 98 F (36.7 C) (Oral)  Ht 5\' 3"  (1.6 m)  Wt 236 lb 9.6 oz (107.321 kg)  BMI 41.92 kg/m2  SpO2 98% General appearance: alert, cooperative, appears stated age and no distress Head: Normocephalic, without obvious abnormality, atraumatic Eyes: conjunctivae/corneas clear. PERRL, EOM's intact. Fundi benign., conjunctivae/corneas clear. PERRL, EOM&#39;s intact. Fundi benign. Ears: normal TM's and external ear canals both ears Nose: Nares normal. Septum midline. Mucosa normal. No drainage or sinus tenderness. Throat: lips, mucosa, and tongue normal; teeth and gums normal Neck: no adenopathy, no carotid bruit, no JVD, supple, symmetrical, trachea midline and thyroid not enlarged, symmetric, no tenderness/mass/nodules Back: symmetric, no curvature. ROM normal. No CVA tenderness. Lungs: clear to auscultation bilaterally Breasts: normal appearance, no masses or tenderness Heart: regular rate and rhythm, S1, S2 normal, no murmur, click, rub or gallop Abdomen: soft, non-tender; bowel sounds normal; no masses,  no organomegaly Pelvic: external genitalia normal, no adnexal masses or tenderness, rectovaginal septum normal, uterus surgically absent, vagina normal without discharge and vaginal smear done, heme neg brown stool Extremities: extremities normal, atraumatic, no cyanosis or edema Pulses: 2+ and symmetric Skin: Skin color, texture, turgor normal. No rashes or lesions Lymph nodes: Cervical, supraclavicular, and axillary nodes normal. Neurologic: Alert and oriented X 3, normal strength and tone. Normal symmetric reflexes. Normal coordination and gait Psych--- no depression, no anxiety      Assessment:    Healthy female exam.      Plan:     ghm utd Check labs See After Visit Summary for Counseling Recommendations    1. Genital herpes   - valACYclovir (VALTREX) 1000 MG tablet; Take 1 tablet (1,000 mg total) by  mouth 3 (three) times daily.  Dispense: 30 tablet; Refill: 0  2. History of hepatitis C   - Hepatitis C RNA quantitative  3. Osteoarthrosis, unspecified whether generalized or localized, involving lower leg   - diclofenac (VOLTAREN) 75 MG EC tablet; Take 1 tablet (75 mg total) by mouth 2 (two) times daily.  Dispense: 20 tablet; Refill: 0  4. Preventative health care   - Basic metabolic panel - CBC with Differential/Platelet - Hepatic function panel - Lipid panel - POCT urinalysis dipstick - TSH  5. Severe obesity (BMI >= 40)

## 2014-03-12 NOTE — Patient Instructions (Signed)
Preventive Care for Adults A healthy lifestyle and preventive care can promote health and wellness. Preventive health guidelines for women include the following key practices.  A routine yearly physical is a good way to check with your health care provider about your health and preventive screening. It is a chance to share any concerns and updates on your health and to receive a thorough exam.  Visit your dentist for a routine exam and preventive care every 6 months. Brush your teeth twice a day and floss once a day. Good oral hygiene prevents tooth decay and gum disease.  The frequency of eye exams is based on your age, health, family medical history, use of contact lenses, and other factors. Follow your health care provider's recommendations for frequency of eye exams.  Eat a healthy diet. Foods like vegetables, fruits, whole grains, low-fat dairy products, and lean protein foods contain the nutrients you need without too many calories. Decrease your intake of foods high in solid fats, added sugars, and salt. Eat the right amount of calories for you.Get information about a proper diet from your health care provider, if necessary.  Regular physical exercise is one of the most important things you can do for your health. Most adults should get at least 150 minutes of moderate-intensity exercise (any activity that increases your heart rate and causes you to sweat) each week. In addition, most adults need muscle-strengthening exercises on 2 or more days a week.  Maintain a healthy weight. The body mass index (BMI) is a screening tool to identify possible weight problems. It provides an estimate of body fat based on height and weight. Your health care provider can find your BMI and can help you achieve or maintain a healthy weight.For adults 20 years and older:  A BMI below 18.5 is considered underweight.  A BMI of 18.5 to 24.9 is normal.  A BMI of 25 to 29.9 is considered overweight.  A BMI of  30 and above is considered obese.  Maintain normal blood lipids and cholesterol levels by exercising and minimizing your intake of saturated fat. Eat a balanced diet with plenty of fruit and vegetables. Blood tests for lipids and cholesterol should begin at age 76 and be repeated every 5 years. If your lipid or cholesterol levels are high, you are over 50, or you are at high risk for heart disease, you may need your cholesterol levels checked more frequently.Ongoing high lipid and cholesterol levels should be treated with medicines if diet and exercise are not working.  If you smoke, find out from your health care provider how to quit. If you do not use tobacco, do not start.  Lung cancer screening is recommended for adults aged 22-80 years who are at high risk for developing lung cancer because of a history of smoking. A yearly low-dose CT scan of the lungs is recommended for people who have at least a 30-pack-year history of smoking and are a current smoker or have quit within the past 15 years. A pack year of smoking is smoking an average of 1 pack of cigarettes a day for 1 year (for example: 1 pack a day for 30 years or 2 packs a day for 15 years). Yearly screening should continue until the smoker has stopped smoking for at least 15 years. Yearly screening should be stopped for people who develop a health problem that would prevent them from having lung cancer treatment.  If you are pregnant, do not drink alcohol. If you are breastfeeding,  be very cautious about drinking alcohol. If you are not pregnant and choose to drink alcohol, do not have more than 1 drink per day. One drink is considered to be 12 ounces (355 mL) of beer, 5 ounces (148 mL) of wine, or 1.5 ounces (44 mL) of liquor.  Avoid use of street drugs. Do not share needles with anyone. Ask for help if you need support or instructions about stopping the use of drugs.  High blood pressure causes heart disease and increases the risk of  stroke. Your blood pressure should be checked at least every 1 to 2 years. Ongoing high blood pressure should be treated with medicines if weight loss and exercise do not work.  If you are 75-52 years old, ask your health care provider if you should take aspirin to prevent strokes.  Diabetes screening involves taking a blood sample to check your fasting blood sugar level. This should be done once every 3 years, after age 15, if you are within normal weight and without risk factors for diabetes. Testing should be considered at a younger age or be carried out more frequently if you are overweight and have at least 1 risk factor for diabetes.  Breast cancer screening is essential preventive care for women. You should practice "breast self-awareness." This means understanding the normal appearance and feel of your breasts and may include breast self-examination. Any changes detected, no matter how small, should be reported to a health care provider. Women in their 58s and 30s should have a clinical breast exam (CBE) by a health care provider as part of a regular health exam every 1 to 3 years. After age 16, women should have a CBE every year. Starting at age 53, women should consider having a mammogram (breast X-ray test) every year. Women who have a family history of breast cancer should talk to their health care provider about genetic screening. Women at a high risk of breast cancer should talk to their health care providers about having an MRI and a mammogram every year.  Breast cancer gene (BRCA)-related cancer risk assessment is recommended for women who have family members with BRCA-related cancers. BRCA-related cancers include breast, ovarian, tubal, and peritoneal cancers. Having family members with these cancers may be associated with an increased risk for harmful changes (mutations) in the breast cancer genes BRCA1 and BRCA2. Results of the assessment will determine the need for genetic counseling and  BRCA1 and BRCA2 testing.  Routine pelvic exams to screen for cancer are no longer recommended for nonpregnant women who are considered low risk for cancer of the pelvic organs (ovaries, uterus, and vagina) and who do not have symptoms. Ask your health care provider if a screening pelvic exam is right for you.  If you have had past treatment for cervical cancer or a condition that could lead to cancer, you need Pap tests and screening for cancer for at least 20 years after your treatment. If Pap tests have been discontinued, your risk factors (such as having a new sexual partner) need to be reassessed to determine if screening should be resumed. Some women have medical problems that increase the chance of getting cervical cancer. In these cases, your health care provider may recommend more frequent screening and Pap tests.  The HPV test is an additional test that may be used for cervical cancer screening. The HPV test looks for the virus that can cause the cell changes on the cervix. The cells collected during the Pap test can be  tested for HPV. The HPV test could be used to screen women aged 30 years and older, and should be used in women of any age who have unclear Pap test results. After the age of 30, women should have HPV testing at the same frequency as a Pap test.  Colorectal cancer can be detected and often prevented. Most routine colorectal cancer screening begins at the age of 50 years and continues through age 75 years. However, your health care provider may recommend screening at an earlier age if you have risk factors for colon cancer. On a yearly basis, your health care provider may provide home test kits to check for hidden blood in the stool. Use of a small camera at the end of a tube, to directly examine the colon (sigmoidoscopy or colonoscopy), can detect the earliest forms of colorectal cancer. Talk to your health care provider about this at age 50, when routine screening begins. Direct  exam of the colon should be repeated every 5-10 years through age 75 years, unless early forms of pre-cancerous polyps or small growths are found.  People who are at an increased risk for hepatitis B should be screened for this virus. You are considered at high risk for hepatitis B if:  You were born in a country where hepatitis B occurs often. Talk with your health care provider about which countries are considered high risk.  Your parents were born in a high-risk country and you have not received a shot to protect against hepatitis B (hepatitis B vaccine).  You have HIV or AIDS.  You use needles to inject street drugs.  You live with, or have sex with, someone who has hepatitis B.  You get hemodialysis treatment.  You take certain medicines for conditions like cancer, organ transplantation, and autoimmune conditions.  Hepatitis C blood testing is recommended for all people born from 1945 through 1965 and any individual with known risks for hepatitis C.  Practice safe sex. Use condoms and avoid high-risk sexual practices to reduce the spread of sexually transmitted infections (STIs). STIs include gonorrhea, chlamydia, syphilis, trichomonas, herpes, HPV, and human immunodeficiency virus (HIV). Herpes, HIV, and HPV are viral illnesses that have no cure. They can result in disability, cancer, and death.  You should be screened for sexually transmitted illnesses (STIs) including gonorrhea and chlamydia if:  You are sexually active and are younger than 24 years.  You are older than 24 years and your health care provider tells you that you are at risk for this type of infection.  Your sexual activity has changed since you were last screened and you are at an increased risk for chlamydia or gonorrhea. Ask your health care provider if you are at risk.  If you are at risk of being infected with HIV, it is recommended that you take a prescription medicine daily to prevent HIV infection. This is  called preexposure prophylaxis (PrEP). You are considered at risk if:  You are a heterosexual woman, are sexually active, and are at increased risk for HIV infection.  You take drugs by injection.  You are sexually active with a partner who has HIV.  Talk with your health care provider about whether you are at high risk of being infected with HIV. If you choose to begin PrEP, you should first be tested for HIV. You should then be tested every 3 months for as long as you are taking PrEP.  Osteoporosis is a disease in which the bones lose minerals and strength   with aging. This can result in serious bone fractures or breaks. The risk of osteoporosis can be identified using a bone density scan. Women ages 65 years and over and women at risk for fractures or osteoporosis should discuss screening with their health care providers. Ask your health care provider whether you should take a calcium supplement or vitamin D to reduce the rate of osteoporosis.  Menopause can be associated with physical symptoms and risks. Hormone replacement therapy is available to decrease symptoms and risks. You should talk to your health care provider about whether hormone replacement therapy is right for you.  Use sunscreen. Apply sunscreen liberally and repeatedly throughout the day. You should seek shade when your shadow is shorter than you. Protect yourself by wearing long sleeves, pants, a wide-brimmed hat, and sunglasses year round, whenever you are outdoors.  Once a month, do a whole body skin exam, using a mirror to look at the skin on your back. Tell your health care provider of new moles, moles that have irregular borders, moles that are larger than a pencil eraser, or moles that have changed in shape or color.  Stay current with required vaccines (immunizations).  Influenza vaccine. All adults should be immunized every year.  Tetanus, diphtheria, and acellular pertussis (Td, Tdap) vaccine. Pregnant women should  receive 1 dose of Tdap vaccine during each pregnancy. The dose should be obtained regardless of the length of time since the last dose. Immunization is preferred during the 27th-36th week of gestation. An adult who has not previously received Tdap or who does not know her vaccine status should receive 1 dose of Tdap. This initial dose should be followed by tetanus and diphtheria toxoids (Td) booster doses every 10 years. Adults with an unknown or incomplete history of completing a 3-dose immunization series with Td-containing vaccines should begin or complete a primary immunization series including a Tdap dose. Adults should receive a Td booster every 10 years.  Varicella vaccine. An adult without evidence of immunity to varicella should receive 2 doses or a second dose if she has previously received 1 dose. Pregnant females who do not have evidence of immunity should receive the first dose after pregnancy. This first dose should be obtained before leaving the health care facility. The second dose should be obtained 4-8 weeks after the first dose.  Human papillomavirus (HPV) vaccine. Females aged 13-26 years who have not received the vaccine previously should obtain the 3-dose series. The vaccine is not recommended for use in pregnant females. However, pregnancy testing is not needed before receiving a dose. If a female is found to be pregnant after receiving a dose, no treatment is needed. In that case, the remaining doses should be delayed until after the pregnancy. Immunization is recommended for any person with an immunocompromised condition through the age of 26 years if she did not get any or all doses earlier. During the 3-dose series, the second dose should be obtained 4-8 weeks after the first dose. The third dose should be obtained 24 weeks after the first dose and 16 weeks after the second dose.  Zoster vaccine. One dose is recommended for adults aged 60 years or older unless certain conditions are  present.  Measles, mumps, and rubella (MMR) vaccine. Adults born before 1957 generally are considered immune to measles and mumps. Adults born in 1957 or later should have 1 or more doses of MMR vaccine unless there is a contraindication to the vaccine or there is laboratory evidence of immunity to   each of the three diseases. A routine second dose of MMR vaccine should be obtained at least 28 days after the first dose for students attending postsecondary schools, health care workers, or international travelers. People who received inactivated measles vaccine or an unknown type of measles vaccine during 1963-1967 should receive 2 doses of MMR vaccine. People who received inactivated mumps vaccine or an unknown type of mumps vaccine before 1979 and are at high risk for mumps infection should consider immunization with 2 doses of MMR vaccine. For females of childbearing age, rubella immunity should be determined. If there is no evidence of immunity, females who are not pregnant should be vaccinated. If there is no evidence of immunity, females who are pregnant should delay immunization until after pregnancy. Unvaccinated health care workers born before 1957 who lack laboratory evidence of measles, mumps, or rubella immunity or laboratory confirmation of disease should consider measles and mumps immunization with 2 doses of MMR vaccine or rubella immunization with 1 dose of MMR vaccine.  Pneumococcal 13-valent conjugate (PCV13) vaccine. When indicated, a person who is uncertain of her immunization history and has no record of immunization should receive the PCV13 vaccine. An adult aged 19 years or older who has certain medical conditions and has not been previously immunized should receive 1 dose of PCV13 vaccine. This PCV13 should be followed with a dose of pneumococcal polysaccharide (PPSV23) vaccine. The PPSV23 vaccine dose should be obtained at least 8 weeks after the dose of PCV13 vaccine. An adult aged 19  years or older who has certain medical conditions and previously received 1 or more doses of PPSV23 vaccine should receive 1 dose of PCV13. The PCV13 vaccine dose should be obtained 1 or more years after the last PPSV23 vaccine dose.  Pneumococcal polysaccharide (PPSV23) vaccine. When PCV13 is also indicated, PCV13 should be obtained first. All adults aged 65 years and older should be immunized. An adult younger than age 65 years who has certain medical conditions should be immunized. Any person who resides in a nursing home or long-term care facility should be immunized. An adult smoker should be immunized. People with an immunocompromised condition and certain other conditions should receive both PCV13 and PPSV23 vaccines. People with human immunodeficiency virus (HIV) infection should be immunized as soon as possible after diagnosis. Immunization during chemotherapy or radiation therapy should be avoided. Routine use of PPSV23 vaccine is not recommended for American Indians, [place] Natives, or people younger than 65 years unless there are medical conditions that require PPSV23 vaccine. When indicated, people who have unknown immunization and have no record of immunization should receive PPSV23 vaccine. One-time revaccination 5 years after the first dose of PPSV23 is recommended for people aged 19-64 years who have chronic kidney failure, nephrotic syndrome, asplenia, or immunocompromised conditions. People who received 1-2 doses of PPSV23 before age 65 years should receive another dose of PPSV23 vaccine at age 65 years or later if at least 5 years have passed since the previous dose. Doses of PPSV23 are not needed for people immunized with PPSV23 at or after age 65 years.  Meningococcal vaccine. Adults with asplenia or persistent complement component deficiencies should receive 2 doses of quadrivalent meningococcal conjugate (MenACWY-D) vaccine. The doses should be obtained at least 2 months apart.  Microbiologists working with certain meningococcal bacteria, military recruits, people at risk during an outbreak, and people who travel to or live in countries with a high rate of meningitis should be immunized. A first-year college student up through age   21 years who is living in a residence hall should receive a dose if she did not receive a dose on or after her 16th birthday. Adults who have certain high-risk conditions should receive one or more doses of vaccine.  Hepatitis A vaccine. Adults who wish to be protected from this disease, have certain high-risk conditions, work with hepatitis A-infected animals, work in hepatitis A research labs, or travel to or work in countries with a high rate of hepatitis A should be immunized. Adults who were previously unvaccinated and who anticipate close contact with an international adoptee during the first 60 days after arrival in the [place] from a country with a high rate of hepatitis A should be immunized.  Hepatitis B vaccine. Adults who wish to be protected from this disease, have certain high-risk conditions, may be exposed to blood or other infectious body fluids, are household contacts or sex partners of hepatitis B positive people, are clients or workers in certain care facilities, or travel to or work in countries with a high rate of hepatitis B should be immunized.  Haemophilus influenzae type b (Hib) vaccine. A previously unvaccinated person with asplenia or sickle cell disease or having a scheduled splenectomy should receive 1 dose of Hib vaccine. Regardless of previous immunization, a recipient of a hematopoietic stem cell transplant should receive a 3-dose series 6-12 months after her successful transplant. Hib vaccine is not recommended for adults with HIV infection. Preventive Services / Frequency Ages 64 to 68 years  Blood pressure check.** / Every 1 to 2 years.  Lipid and cholesterol check.** / Every 5 years beginning at age  22.  Clinical breast exam.** / Every 3 years for women in their 88s and 53s.  BRCA-related cancer risk assessment.** / For women who have family members with a BRCA-related cancer (breast, ovarian, tubal, or peritoneal cancers).  Pap test.** / Every 2 years from ages 90 through 51. Every 3 years starting at age 21 through age 56 or 3 with a history of 3 consecutive normal Pap tests.  HPV screening.** / Every 3 years from ages 24 through ages 1 to 46 with a history of 3 consecutive normal Pap tests.  Hepatitis C blood test.** / For any individual with known risks for hepatitis C.  Skin self-exam. / Monthly.  Influenza vaccine. / Every year.  Tetanus, diphtheria, and acellular pertussis (Tdap, Td) vaccine.** / Consult your health care provider. Pregnant women should receive 1 dose of Tdap vaccine during each pregnancy. 1 dose of Td every 10 years.  Varicella vaccine.** / Consult your health care provider. Pregnant females who do not have evidence of immunity should receive the first dose after pregnancy.  HPV vaccine. / 3 doses over 6 months, if 72 and younger. The vaccine is not recommended for use in pregnant females. However, pregnancy testing is not needed before receiving a dose.  Measles, mumps, rubella (MMR) vaccine.** / You need at least 1 dose of MMR if you were born in 1957 or later. You may also need a 2nd dose. For females of childbearing age, rubella immunity should be determined. If there is no evidence of immunity, females who are not pregnant should be vaccinated. If there is no evidence of immunity, females who are pregnant should delay immunization until after pregnancy.  Pneumococcal 13-valent conjugate (PCV13) vaccine.** / Consult your health care provider.  Pneumococcal polysaccharide (PPSV23) vaccine.** / 1 to 2 doses if you smoke cigarettes or if you have certain conditions.  Meningococcal vaccine.** /  1 dose if you are age 19 to 21 years and a first-year college  student living in a residence hall, or have one of several medical conditions, you need to get vaccinated against meningococcal disease. You may also need additional booster doses.  Hepatitis A vaccine.** / Consult your health care provider.  Hepatitis B vaccine.** / Consult your health care provider.  Haemophilus influenzae type b (Hib) vaccine.** / Consult your health care provider. Ages 40 to 64 years  Blood pressure check.** / Every 1 to 2 years.  Lipid and cholesterol check.** / Every 5 years beginning at age 20 years.  Lung cancer screening. / Every year if you are aged 55-80 years and have a 30-pack-year history of smoking and currently smoke or have quit within the past 15 years. Yearly screening is stopped once you have quit smoking for at least 15 years or develop a health problem that would prevent you from having lung cancer treatment.  Clinical breast exam.** / Every year after age 40 years.  BRCA-related cancer risk assessment.** / For women who have family members with a BRCA-related cancer (breast, ovarian, tubal, or peritoneal cancers).  Mammogram.** / Every year beginning at age 40 years and continuing for as long as you are in good health. Consult with your health care provider.  Pap test.** / Every 3 years starting at age 30 years through age 65 or 70 years with a history of 3 consecutive normal Pap tests.  HPV screening.** / Every 3 years from ages 30 years through ages 65 to 70 years with a history of 3 consecutive normal Pap tests.  Fecal occult blood test (FOBT) of stool. / Every year beginning at age 50 years and continuing until age 75 years. You may not need to do this test if you get a colonoscopy every 10 years.  Flexible sigmoidoscopy or colonoscopy.** / Every 5 years for a flexible sigmoidoscopy or every 10 years for a colonoscopy beginning at age 50 years and continuing until age 75 years.  Hepatitis C blood test.** / For all people born from 1945 through  1965 and any individual with known risks for hepatitis C.  Skin self-exam. / Monthly.  Influenza vaccine. / Every year.  Tetanus, diphtheria, and acellular pertussis (Tdap/Td) vaccine.** / Consult your health care provider. Pregnant women should receive 1 dose of Tdap vaccine during each pregnancy. 1 dose of Td every 10 years.  Varicella vaccine.** / Consult your health care provider. Pregnant females who do not have evidence of immunity should receive the first dose after pregnancy.  Zoster vaccine.** / 1 dose for adults aged 60 years or older.  Measles, mumps, rubella (MMR) vaccine.** / You need at least 1 dose of MMR if you were born in 1957 or later. You may also need a 2nd dose. For females of childbearing age, rubella immunity should be determined. If there is no evidence of immunity, females who are not pregnant should be vaccinated. If there is no evidence of immunity, females who are pregnant should delay immunization until after pregnancy.  Pneumococcal 13-valent conjugate (PCV13) vaccine.** / Consult your health care provider.  Pneumococcal polysaccharide (PPSV23) vaccine.** / 1 to 2 doses if you smoke cigarettes or if you have certain conditions.  Meningococcal vaccine.** / Consult your health care provider.  Hepatitis A vaccine.** / Consult your health care provider.  Hepatitis B vaccine.** / Consult your health care provider.  Haemophilus influenzae type b (Hib) vaccine.** / Consult your health care provider. Ages 65   years and over  Blood pressure check.** / Every 1 to 2 years.  Lipid and cholesterol check.** / Every 5 years beginning at age 22 years.  Lung cancer screening. / Every year if you are aged 73-80 years and have a 30-pack-year history of smoking and currently smoke or have quit within the past 15 years. Yearly screening is stopped once you have quit smoking for at least 15 years or develop a health problem that would prevent you from having lung cancer  treatment.  Clinical breast exam.** / Every year after age 4 years.  BRCA-related cancer risk assessment.** / For women who have family members with a BRCA-related cancer (breast, ovarian, tubal, or peritoneal cancers).  Mammogram.** / Every year beginning at age 40 years and continuing for as long as you are in good health. Consult with your health care provider.  Pap test.** / Every 3 years starting at age 9 years through age 34 or 91 years with 3 consecutive normal Pap tests. Testing can be stopped between 65 and 70 years with 3 consecutive normal Pap tests and no abnormal Pap or HPV tests in the past 10 years.  HPV screening.** / Every 3 years from ages 57 years through ages 64 or 45 years with a history of 3 consecutive normal Pap tests. Testing can be stopped between 65 and 70 years with 3 consecutive normal Pap tests and no abnormal Pap or HPV tests in the past 10 years.  Fecal occult blood test (FOBT) of stool. / Every year beginning at age 15 years and continuing until age 17 years. You may not need to do this test if you get a colonoscopy every 10 years.  Flexible sigmoidoscopy or colonoscopy.** / Every 5 years for a flexible sigmoidoscopy or every 10 years for a colonoscopy beginning at age 86 years and continuing until age 71 years.  Hepatitis C blood test.** / For all people born from [birth year] through 1967 and any individual with known risks for hepatitis C.  Osteoporosis screening.** / A one-time screening for women ages 83 years and over and women at risk for fractures or osteoporosis.  Skin self-exam. / Monthly.  Influenza vaccine. / Every year.  Tetanus, diphtheria, and acellular pertussis (Tdap/Td) vaccine.** / 1 dose of Td every 10 years.  Varicella vaccine.** / Consult your health care provider.  Zoster vaccine.** / 1 dose for adults aged 61 years or older.  Pneumococcal 13-valent conjugate (PCV13) vaccine.** / Consult your health care provider.  Pneumococcal  polysaccharide (PPSV23) vaccine.** / 1 dose for all adults aged 28 years and older.  Meningococcal vaccine.** / Consult your health care provider.  Hepatitis A vaccine.** / Consult your health care provider.  Hepatitis B vaccine.** / Consult your health care provider.  Haemophilus influenzae type b (Hib) vaccine.** / Consult your health care provider. ** Family history and personal history of risk and conditions may change your health care provider's recommendations. Document Released: 03/17/2001 Document Revised: 06/05/2013 Document Reviewed: 06/16/2010 [facility] Patient Information 2015 [place], [place]. This information is not intended to replace advice given to you by your health care provider. Make sure you discuss any questions you have with your health care provider.

## 2014-03-13 LAB — HEPATITIS C RNA QUANTITATIVE: HCV Quantitative: NOT DETECTED IU/mL (ref <15)

## 2014-03-14 LAB — URINE CULTURE

## 2014-03-14 LAB — CYTOLOGY - PAP

## 2014-03-16 ENCOUNTER — Other Ambulatory Visit: Payer: Self-pay

## 2014-03-16 MED ORDER — CIPROFLOXACIN HCL 250 MG PO TABS
250.0000 mg | ORAL_TABLET | Freq: Two times a day (BID) | ORAL | Status: DC
Start: 2014-03-16 — End: 2014-04-12

## 2014-04-10 ENCOUNTER — Emergency Department (HOSPITAL_BASED_OUTPATIENT_CLINIC_OR_DEPARTMENT_OTHER)
Admission: EM | Admit: 2014-04-10 | Discharge: 2014-04-10 | Disposition: A | Discharge status: Home / Self Care | Payer: Medicare HMO | Attending: Emergency Medicine | Admitting: Emergency Medicine

## 2014-04-10 ENCOUNTER — Emergency Department (HOSPITAL_BASED_OUTPATIENT_CLINIC_OR_DEPARTMENT_OTHER): Payer: Medicare HMO

## 2014-04-10 ENCOUNTER — Encounter (HOSPITAL_BASED_OUTPATIENT_CLINIC_OR_DEPARTMENT_OTHER): Payer: Self-pay

## 2014-04-10 DIAGNOSIS — S8992XA Unspecified injury of left lower leg, initial encounter: Secondary | ICD-10-CM | POA: Insufficient documentation

## 2014-04-10 DIAGNOSIS — Z8742 Personal history of other diseases of the female genital tract: Secondary | ICD-10-CM | POA: Diagnosis not present

## 2014-04-10 DIAGNOSIS — J45909 Unspecified asthma, uncomplicated: Secondary | ICD-10-CM | POA: Diagnosis not present

## 2014-04-10 DIAGNOSIS — Z87891 Personal history of nicotine dependence: Secondary | ICD-10-CM | POA: Diagnosis not present

## 2014-04-10 DIAGNOSIS — S299XXA Unspecified injury of thorax, initial encounter: Secondary | ICD-10-CM | POA: Diagnosis present

## 2014-04-10 DIAGNOSIS — G709 Myoneural disorder, unspecified: Secondary | ICD-10-CM | POA: Insufficient documentation

## 2014-04-10 DIAGNOSIS — T148XXA Other injury of unspecified body region, initial encounter: Secondary | ICD-10-CM

## 2014-04-10 DIAGNOSIS — S8991XA Unspecified injury of right lower leg, initial encounter: Secondary | ICD-10-CM | POA: Insufficient documentation

## 2014-04-10 DIAGNOSIS — S20212A Contusion of left front wall of thorax, initial encounter: Secondary | ICD-10-CM | POA: Diagnosis not present

## 2014-04-10 DIAGNOSIS — Y9389 Activity, other specified: Secondary | ICD-10-CM | POA: Insufficient documentation

## 2014-04-10 DIAGNOSIS — Z7951 Long term (current) use of inhaled steroids: Secondary | ICD-10-CM | POA: Diagnosis not present

## 2014-04-10 DIAGNOSIS — Z791 Long term (current) use of non-steroidal anti-inflammatories (NSAID): Secondary | ICD-10-CM | POA: Insufficient documentation

## 2014-04-10 DIAGNOSIS — Z792 Long term (current) use of antibiotics: Secondary | ICD-10-CM | POA: Insufficient documentation

## 2014-04-10 DIAGNOSIS — Y998 Other external cause status: Secondary | ICD-10-CM | POA: Insufficient documentation

## 2014-04-10 DIAGNOSIS — Z86718 Personal history of other venous thrombosis and embolism: Secondary | ICD-10-CM | POA: Diagnosis not present

## 2014-04-10 DIAGNOSIS — Y92410 Unspecified street and highway as the place of occurrence of the external cause: Secondary | ICD-10-CM | POA: Diagnosis not present

## 2014-04-10 DIAGNOSIS — Z79899 Other long term (current) drug therapy: Secondary | ICD-10-CM | POA: Diagnosis not present

## 2014-04-10 DIAGNOSIS — Z8619 Personal history of other infectious and parasitic diseases: Secondary | ICD-10-CM | POA: Diagnosis not present

## 2014-04-10 DIAGNOSIS — K219 Gastro-esophageal reflux disease without esophagitis: Secondary | ICD-10-CM | POA: Diagnosis not present

## 2014-04-10 MED ORDER — IBUPROFEN 800 MG PO TABS
800.0000 mg | ORAL_TABLET | Freq: Once | ORAL | Status: AC
  Administered 2014-04-10: 800 mg via ORAL
  Filled 2014-04-10: qty 1

## 2014-04-10 MED ORDER — OXYCODONE-ACETAMINOPHEN 5-325 MG PO TABS: ORAL_TABLET | ORAL | Status: DC
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
## 2014-04-10 NOTE — Discharge Instructions (Signed)
Take percocet for breakthrough pain, do not drink alcohol, drive, care for children or do other critical tasks while taking percocet.   It is very important that you take deep breaths to prevent lung collapse and infection.  Take 10 deep breaths every hour to prevent lung collapse.  If you develop cough, fever or shortness of breath return immediately to the emergency room.

## 2014-04-12 ENCOUNTER — Encounter: Payer: Self-pay | Admitting: Internal Medicine

## 2014-04-12 ENCOUNTER — Ambulatory Visit (INDEPENDENT_AMBULATORY_CARE_PROVIDER_SITE_OTHER): Payer: Medicare HMO | Admitting: Internal Medicine

## 2014-04-12 VITALS — BP 120/64 | HR 77 | Temp 98.3°F | Ht 63.0 in | Wt 238.1 lb

## 2014-04-12 DIAGNOSIS — S4350XD Sprain of unspecified acromioclavicular joint, subsequent encounter: Secondary | ICD-10-CM

## 2014-04-12 DIAGNOSIS — S4380XD Sprain of other specified parts of unspecified shoulder girdle, subsequent encounter: Secondary | ICD-10-CM

## 2014-04-12 DIAGNOSIS — S20212D Contusion of left front wall of thorax, subsequent encounter: Secondary | ICD-10-CM

## 2014-04-12 MED ORDER — OXYCODONE-ACETAMINOPHEN 5-325 MG PO TABS: 1.0000 | ORAL_TABLET | Freq: Three times a day (TID) | ORAL | Status: DC | PRN

## 2014-04-12 MED ORDER — CYCLOBENZAPRINE HCL 10 MG PO TABS: 10.0000 mg | ORAL_TABLET | Freq: Every evening | ORAL | Status: DC | PRN
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
## 2014-04-12 NOTE — Progress Notes (Signed)
Pre visit review using our clinic review tool, if applicable. No additional management support is needed unless otherwise documented below in the visit note. 

## 2014-04-12 NOTE — Patient Instructions (Signed)
IBUPROFEN (Advil or Motrin) 200 mg 2 tablets every 6 hours as needed for pain.  Always take it with food because may cause gastritis and ulcers.  If you notice nausea, stomach pain, change in the color of stools --->  Stop the medicine and let us know  Also take the Oxycodone as needed no more than 3 times a day  Flexeril, a muscle relaxant at night as needed , will cause drowsiness   Warm compress to the left shoulder  Call if you are not gradually improving in the next 2 weeks

## 2014-05-08 ENCOUNTER — Telehealth: Payer: Self-pay | Admitting: Family Medicine

## 2014-05-08 NOTE — Telephone Encounter (Signed)
ov

## 2014-05-08 NOTE — Telephone Encounter (Signed)
Relation to pt: self  Call back number: [phone] Pharmacy: [facility] [address] - [place], [address] AT [facility] OF [address] [phone] (Phone) [phone] (Fax)         Reason for call:  Pt requesting a refill ranitidine (ZANTAC) 150 MG tablet,  And states the antibiotics given is not working pt states she is expericing discharge. Requesting another RX and clinical advice

## 2014-05-08 NOTE — Telephone Encounter (Signed)
Please triage symptoms.

## 2014-05-08 NOTE — Telephone Encounter (Signed)
Patient states she was seen for vaginal discharge and it is still occuring.  It is a small amount, brown, has odor.  No pain or itching, no urinary symptoms.  She states it has not cleared since her visit and she would like to know if she can be sent another antibiotic, or would you like her to come back for office visit?    Please advise.

## 2014-05-08 NOTE — Telephone Encounter (Signed)
Message left advising the patient to schedule an office visit for a follow up.       KP

## 2014-05-14 ENCOUNTER — Other Ambulatory Visit (HOSPITAL_COMMUNITY)
Admission: RE | Admit: 2014-05-14 | Discharge: 2014-05-14 | Disposition: A | Discharge status: Home / Self Care | Payer: Medicare HMO | Source: Ambulatory Visit | Attending: Family Medicine | Admitting: Family Medicine

## 2014-05-14 ENCOUNTER — Encounter: Payer: Self-pay | Admitting: Family Medicine

## 2014-05-14 ENCOUNTER — Ambulatory Visit (INDEPENDENT_AMBULATORY_CARE_PROVIDER_SITE_OTHER): Payer: Medicare HMO | Admitting: Family Medicine

## 2014-05-14 VITALS — BP 136/82 | HR 71 | Temp 97.9°F | Resp 18 | Ht 63.0 in | Wt 234.0 lb

## 2014-05-14 DIAGNOSIS — N898 Other specified noninflammatory disorders of vagina: Secondary | ICD-10-CM | POA: Diagnosis not present

## 2014-05-14 DIAGNOSIS — N760 Acute vaginitis: Secondary | ICD-10-CM | POA: Insufficient documentation

## 2014-05-14 MED ORDER — FLUCONAZOLE 150 MG PO TABS: ORAL_TABLET | ORAL | Status: DC

## 2014-05-14 NOTE — Progress Notes (Signed)
Subjective:     Molly Klein is a 54 y.o. female who presents for evaluation of an abnormal vaginal discharge. Symptoms have been present for several weeks. Vaginal symptoms: discharge described as malodorous and dark, odor and vulvar itching. Contraception: none. She denies abnormal bleeding, blisters, bumps, burning, discharge, dyspareunia, lesions, local irritation, pain, post coital bleeding, tears, urinary symptoms of urinary frequency, vulvar itching and warts Sexually transmitted infection risk: very low risk of STD exposure. Menstrual flow: rare.  The following portions of the patient's history were reviewed and updated as appropriate: allergies, current medications, past family history, past medical history, past social history, past surgical history and problem list.   Review of Systems Pertinent items are noted in HPI.    Objective:    BP 136/82 mmHg  Pulse 71  Temp(Src) 97.9 F (36.6 C) (Oral)  Resp 18  Ht 5\' 3"  (1.6 m)  Wt 234 lb (106.142 kg)  BMI 41.46 kg/m2  SpO2 98% General appearance: alert, cooperative, appears stated age and no distress Throat: lips, mucosa, and tongue normal; teeth and gums normal Neck: no adenopathy, no carotid bruit, no JVD, supple, symmetrical, trachea midline and thyroid not enlarged, symmetric, no tenderness/mass/nodules Lungs: clear to auscultation bilaterally Heart: S1, S2 normal Abdomen: soft, non-tender; bowel sounds normal; no masses,  no organomegaly Pelvic: cervix normal in appearance, external genitalia normal, no adnexal masses or tenderness, no cervical motion tenderness, positive findings: vaginal discharge:  white, rectovaginal septum normal, uterus normal size, shape, and consistency and vagina normal without discharge Extremities: extremities normal, atraumatic, no cyanosis or edema    Assessment:    Monilial vulvo-vaginitis.    Plan:    Symptomatic local care discussed. Scientist, clinical (histocompatibility and immunogenetics) distributed. Oral  antifungal see orders. cultures sent

## 2014-05-14 NOTE — Progress Notes (Signed)
Pre visit review using our clinic review tool, if applicable. No additional management support is needed unless otherwise documented below in the visit note. 

## 2014-05-16 ENCOUNTER — Telehealth: Payer: Self-pay | Admitting: Physician Assistant

## 2014-05-16 DIAGNOSIS — B9689 Other specified bacterial agents as the cause of diseases classified elsewhere: Secondary | ICD-10-CM

## 2014-05-16 DIAGNOSIS — N760 Acute vaginitis: Principal | ICD-10-CM

## 2014-05-16 LAB — CERVICOVAGINAL ANCILLARY ONLY: Wet Prep (BD Affirm): POSITIVE — AB

## 2014-05-16 MED ORDER — METRONIDAZOLE 500 MG PO TABS: 500.0000 mg | ORAL_TABLET | Freq: Two times a day (BID) | ORAL | Status: DC

## 2014-05-16 NOTE — Telephone Encounter (Signed)
Reviewing for Dr. Etter Sjogren who is out of office. Wet prep positive for bacterial vaginosis.  Will send in Rx Flagyl to her pharmacy.  Take as directed.  No alcohol while on medication. Start a daily probiotic.  Return if symptoms are not improving.

## 2014-05-16 NOTE — Telephone Encounter (Signed)
Patient informed, understood & had lots of questions ["believes spouse may be cheating"] and wanted to know if she had a VD"; explained to patient that the BV diagnosis may be sexually transmitted and just like yeast infection [being treated for by PCP] can be passed to partner during sexual intercourse, but BV is derived from bacteria and, but it's origin cannot be determined. Went over provider response numerous times [spelled information], as requested by patient for further personal research about BV, yeast infection and the Rx prescribed [Flagyl and Diflucan]. Patient understood & agreed/SLS .

## 2014-05-17 ENCOUNTER — Telehealth: Payer: Self-pay | Admitting: Family Medicine

## 2014-05-17 DIAGNOSIS — Z9989 Dependence on other enabling machines and devices: Principal | ICD-10-CM

## 2014-05-17 DIAGNOSIS — G4733 Obstructive sleep apnea (adult) (pediatric): Secondary | ICD-10-CM

## 2014-05-17 NOTE — Telephone Encounter (Signed)
Patient had a lot of desaturation while on Cpap and they are thinking she will need O2. She will send over the report.     KP

## 2014-05-17 NOTE — Telephone Encounter (Signed)
Caller name: mandy at [facility] Relation to pt: Call back number: [phone] Pharmacy:  Reason for call:   Requesting O2 order.

## 2014-05-21 NOTE — Telephone Encounter (Signed)
Ok to order o2 and refer to pulmonary for f/u osa

## 2014-05-21 NOTE — Telephone Encounter (Signed)
Report given to MD.     KP

## 2014-05-21 NOTE — Telephone Encounter (Signed)
Patient aware and verbalized understanding, she agreed to the Oxygen.     KP

## 2014-05-21 NOTE — Telephone Encounter (Signed)
Order and referral has been placed.     KP

## 2014-07-05 ENCOUNTER — Encounter: Payer: Self-pay | Admitting: Pulmonary Disease

## 2014-07-05 ENCOUNTER — Ambulatory Visit (INDEPENDENT_AMBULATORY_CARE_PROVIDER_SITE_OTHER): Payer: Medicare HMO | Admitting: Pulmonary Disease

## 2014-07-05 VITALS — BP 134/84 | HR 69 | Ht 63.0 in | Wt 239.0 lb

## 2014-07-05 DIAGNOSIS — G4733 Obstructive sleep apnea (adult) (pediatric): Secondary | ICD-10-CM | POA: Diagnosis not present

## 2014-07-05 DIAGNOSIS — G47419 Narcolepsy without cataplexy: Secondary | ICD-10-CM

## 2014-07-05 DIAGNOSIS — J4521 Mild intermittent asthma with (acute) exacerbation: Secondary | ICD-10-CM

## 2014-07-05 DIAGNOSIS — Z6841 Body Mass Index (BMI) 40.0 and over, adult: Secondary | ICD-10-CM

## 2014-07-05 DIAGNOSIS — Z9989 Dependence on other enabling machines and devices: Principal | ICD-10-CM

## 2014-07-05 NOTE — Patient Instructions (Signed)
Will arrange for overnight oxygen test with you using CPAP only Will get copy of CPAP report Follow up in 1 year

## 2014-07-05 NOTE — Progress Notes (Signed)
Chief Complaint  Patient presents with  . Follow-up    Last seen 2014 - OSA. Using CPAP machine. Uses O2 at night only. No complaints.     History of Present Illness: Molly Klein is a 54 y.o. female former smoker with asthma.  She also has hx of OSA and narcolepsy.  I have not seen her since 2014.  Since her last visit she was started on supplemental oxygen at night.  From her description it seems that her ONO was done on room air w/o her using CPAP.  She is not sure who ordered the ONO.  She continues to use CPAP.  She feels this is working well.  She denies any issues with mask fit.  She still gets sleepy during the day.  She has no trouble staying awake when she is active.  She can fall asleep while watching TV at night.  She is not having cough, wheeze, sputum, or chest discomfort.  She uses albuterol once or twice per week.  TESTS: PFT 12/18/11>>FEV1 2.10 (89%), FEV1% 82, TLC 3.86 (81%), DLCO 63%, +BD from FEF 25-75%  CT chest 10/02/11>>minimal reticular nodular opacity Rt lung base, distal esophageal thickening  PSG 02/10/12 >> AHI 87.7, SpO2 low 75%. Auto CPAP 03/23/12 to 04/04/12 >> Used on 12 of 13 nights with average 7 hrs 4 min. Average AHI 6.5 with mean CPAP 9 cm H2O and 90th percentile CPAP 12 cm H2O. CT chest 09/19/12 >> clearing of RLL GGO  PMHx >> GERD, Hep C, DVT 2014  PSHx, Medications, Allergies, Fhx, Shx reviewed.  Physical Exam: Blood pressure 134/84, pulse 69, height 5\' 3"  (1.6 m), weight 239 lb (108.41 kg), SpO2 97 %. Body mass index is 42.35 kg/(m^2).  General - No distress ENT - No sinus tenderness, clear nasal discharge, no oral exudate, no LAN, MP 4, enlarged tongue Cardiac - s1s2 regular, no murmur Chest - No wheeze/rales/dullness Back - No focal tenderness Abd - Soft, non-tender Ext - No edema Neuro - Normal strength Skin - No rashes Psych - normal mood, and behavior   Assessment/Plan:  Obstructive sleep apnea. She reports compliance  with CPAP and benefit from therapy. 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 Plan: - defer stimulant medication therapy at this time - advised her to try taking 10 to 15 minute naps once or twice per day - discussed maintaining regular sleep/wake pattern - safe driving practices reviewed  Asthma. Plan: - continue prn albuterol  Obesity. Plan: - discussed importance of weight loss   Chesley Mires, MD [facility] Pager:  [phone] 07/05/2014, 11:09 AM

## 2014-07-16 ENCOUNTER — Telehealth: Payer: Self-pay | Admitting: Family Medicine

## 2014-07-16 DIAGNOSIS — J45901 Unspecified asthma with (acute) exacerbation: Secondary | ICD-10-CM

## 2014-07-16 DIAGNOSIS — J209 Acute bronchitis, unspecified: Secondary | ICD-10-CM

## 2014-07-16 MED ORDER — ALBUTEROL SULFATE HFA 108 (90 BASE) MCG/ACT IN AERS: 2.0000 | INHALATION_SPRAY | Freq: Four times a day (QID) | RESPIRATORY_TRACT | Status: DC | PRN

## 2014-07-16 NOTE — Telephone Encounter (Signed)
Rx faxed.    KP 

## 2014-07-16 NOTE — Telephone Encounter (Signed)
Caller name: Pranavi Aure Relationship to patient: self Can be reached: [phone] Pharmacy: [facility] on [address]  Reason for call: Pt needing new albuterol (PROVENTIL HFA;VENTOLIN HFA) 108 (90 BASE) MCG/ACT inhaler. She is going out of town Friday. Please send RX to pharmacy.

## 2014-08-24 ENCOUNTER — Encounter: Payer: Self-pay | Admitting: Physician Assistant

## 2014-08-24 ENCOUNTER — Ambulatory Visit (INDEPENDENT_AMBULATORY_CARE_PROVIDER_SITE_OTHER): Payer: Medicare HMO | Admitting: Physician Assistant

## 2014-08-24 ENCOUNTER — Ambulatory Visit (HOSPITAL_BASED_OUTPATIENT_CLINIC_OR_DEPARTMENT_OTHER)
Admission: RE | Admit: 2014-08-24 | Discharge: 2014-08-24 | Disposition: A | Discharge status: Home / Self Care | Payer: Medicare HMO | Source: Ambulatory Visit | Attending: Physician Assistant | Admitting: Physician Assistant

## 2014-08-24 VITALS — BP 110/78 | HR 96 | Temp 97.6°F | Ht 63.0 in | Wt 238.6 lb

## 2014-08-24 DIAGNOSIS — M7989 Other specified soft tissue disorders: Secondary | ICD-10-CM | POA: Diagnosis present

## 2014-08-24 DIAGNOSIS — M25561 Pain in right knee: Secondary | ICD-10-CM

## 2014-08-24 DIAGNOSIS — S6991XA Unspecified injury of right wrist, hand and finger(s), initial encounter: Secondary | ICD-10-CM

## 2014-08-24 MED ORDER — MELOXICAM 15 MG PO TABS: 15.0000 mg | ORAL_TABLET | Freq: Every day | ORAL | Status: DC

## 2014-08-24 NOTE — Progress Notes (Signed)
Pre visit review using our clinic review tool, if applicable. No additional management support is needed unless otherwise documented below in the visit note. 

## 2014-08-24 NOTE — Progress Notes (Signed)
Patient presents to clinic today c/o pain to R wrist and R knee after tripping over shoes and falling onto the affected extremities. Patient endorses [place]. Endorses pain and swelling to dorsum of wrist. Denies numbness, tingling, weakness or bruising. Endorses some decreased ROM due to pain. Also notes tenderness and swelling of kneecap after fall. Denies head trauma or LOC during fall. Has not taken anything for symptoms.  Past Medical History  Diagnosis Date  . Asthma   . GERD (gastroesophageal reflux disease)   . Hepatitis C   . DVT (deep venous thrombosis)   . Nipple discharge     right breast  . Neuromuscular disorder     neuropathy lt arm  . OSA (obstructive sleep apnea)   . Bruising 10/21/2012  . History of DVT of lower extremity 10/21/2012    Current Outpatient Prescriptions on File Prior to Visit  Medication Sig Dispense Refill  . albuterol (PROVENTIL HFA;VENTOLIN HFA) 108 (90 BASE) MCG/ACT inhaler Inhale 2 puffs into the lungs 4 (four) times daily as needed. Shortness of breath 1 Inhaler 6  . albuterol (PROVENTIL) (2.5 MG/3ML) 0.083% nebulizer solution INHALE 1 VIAL BY NEBULIZER AS DIRECTED 75 mL 0  . beclomethasone (QVAR) 40 MCG/ACT inhaler Inhale 2 puffs into the lungs 2 (two) times daily. 1 Inhaler 12  . gabapentin (NEURONTIN) 300 MG capsule Take 1 capsule (300 mg total) by mouth every evening. 90 capsule 0  . ranitidine (ZANTAC) 150 MG tablet Take 1 tablet (150 mg total) by mouth 2 (two) times daily. 60 tablet 3  . valACYclovir (VALTREX) 1000 MG tablet Take 1 tablet (1,000 mg total) by mouth 3 (three) times daily. 30 tablet 0  . cyclobenzaprine (FLEXERIL) 10 MG tablet Take 1 tablet (10 mg total) by mouth at bedtime as needed for muscle spasms. (Patient not taking: Reported on 08/24/2014) 21 tablet 0   No current facility-administered medications on file prior to visit.    Allergies  Allergen Reactions  . Codeine Nausea And Vomiting  . Interferon Beta-1a   .  Sulfonamide Derivatives Nausea Only    Family History  Problem Relation Age of Onset  . Rectal cancer Maternal Grandmother   . Cancer Sister     pt unaware of what kind  . Cancer Maternal Grandmother     breast  . Heart attack Mother     mi in 24s  . Alcohol abuse Mother     cirrhosis of liver  . Heart attack Father     MI in 43s    History   Social History  . Marital Status: Married    Spouse Name: N/A  . Number of Children: N/A  . Years of Education: N/A   Occupational History  . retired    Social History Main Topics  . Smoking status: Former Smoker -- 5 years    Types: Cigarettes    Quit date: 02/03/1975  . Smokeless tobacco: Never Used     Comment: 1 pack per week  . Alcohol Use: No  . Drug Use: No  . Sexual Activity:    Partners: Male   Other Topics Concern  . None   Social History Narrative   Exercise-- 2-3 days a week    Review of Systems - See HPI.  All other ROS are negative.  BP 110/78 mmHg  Pulse 96  Temp(Src) 97.6 F (36.4 C) (Oral)  Ht 5\' 3"  (1.6 m)  Wt 238 lb 9.6 oz (108.228 kg)  BMI 42.28 kg/m2  SpO2 98%  Physical Exam  Constitutional: She is oriented to person, place, and time and well-developed, well-nourished, and in no distress.  HENT:  Head: Normocephalic and atraumatic.  Eyes: Conjunctivae are normal.  Neck: Neck supple.  Cardiovascular: Normal rate, regular rhythm, normal heart sounds and intact distal pulses.   Pulmonary/Chest: Effort normal and breath sounds normal. No respiratory distress. She has no wheezes. She has no rales. She exhibits no tenderness.  Musculoskeletal:       Right elbow: Normal.      Right wrist: She exhibits decreased range of motion, tenderness and swelling. She exhibits no crepitus and no deformity.       Right knee: She exhibits swelling. She exhibits normal range of motion, no ecchymosis, normal alignment, no LCL laxity, normal patellar mobility, normal meniscus and no MCL laxity. Tenderness found.  Patellar tendon tenderness noted.  Neurological: She is alert and oriented to person, place, and time.  Skin: Skin is warm and dry. No rash noted.  Psychiatric: Affect normal.  Vitals reviewed.  Assessment/Plan: Right wrist injury Will obtain x-ray giving [place] to r/o fracture. Suspect sprain. Bracing applied. RICE recommended. Rx Meloxicam to take as directed. Will follow-up based on x-ray results.  Right anterior knee pain Suspect contusion. Mild anterior knee tenderness with swelling but no joint line pain or instability. RICE discussed. Rx mobic. Knee bracing OTC discussed. Follow-up if symptoms are not resolving.

## 2014-08-24 NOTE — Patient Instructions (Signed)
Please go downstairs for an x-ray. I will call you with your results. Take the Mobic daily as directed with food. Use ES tylenol for breakthrough pain if needed. Ice the knee and wrist. Wear the wrist brace over the next week.  If anything is broken on imaging, I will be sending you to see an Orthopedist.

## 2014-08-26 DIAGNOSIS — S6991XA Unspecified injury of right wrist, hand and finger(s), initial encounter: Secondary | ICD-10-CM | POA: Insufficient documentation

## 2014-08-26 DIAGNOSIS — M25562 Pain in left knee: Secondary | ICD-10-CM | POA: Insufficient documentation

## 2014-08-26 NOTE — Assessment & Plan Note (Signed)
Suspect contusion. Mild anterior knee tenderness with swelling but no joint line pain or instability. RICE discussed. Rx mobic. Knee bracing OTC discussed. Follow-up if symptoms are not resolving.

## 2014-08-26 NOTE — Assessment & Plan Note (Signed)
Will obtain x-ray giving Molly Klein to r/o fracture. Suspect sprain. Bracing applied. RICE recommended. Rx Meloxicam to take as directed. Will follow-up based on x-ray results.

## 2014-09-05 ENCOUNTER — Other Ambulatory Visit: Payer: Self-pay

## 2014-09-05 MED ORDER — RANITIDINE HCL 150 MG PO TABS: 150.0000 mg | ORAL_TABLET | Freq: Two times a day (BID) | ORAL | Status: DC

## 2014-09-26 ENCOUNTER — Ambulatory Visit: Payer: Medicare HMO | Attending: Orthopaedic Surgery | Admitting: Physical Therapy

## 2014-09-26 ENCOUNTER — Encounter: Payer: Self-pay | Admitting: Physical Therapy

## 2014-09-26 DIAGNOSIS — M25561 Pain in right knee: Secondary | ICD-10-CM | POA: Insufficient documentation

## 2014-09-26 DIAGNOSIS — M25661 Stiffness of right knee, not elsewhere classified: Secondary | ICD-10-CM | POA: Diagnosis present

## 2014-09-26 DIAGNOSIS — M25662 Stiffness of left knee, not elsewhere classified: Secondary | ICD-10-CM

## 2014-09-26 DIAGNOSIS — M25562 Pain in left knee: Secondary | ICD-10-CM | POA: Insufficient documentation

## 2014-09-26 DIAGNOSIS — R262 Difficulty in walking, not elsewhere classified: Secondary | ICD-10-CM | POA: Diagnosis present

## 2014-09-26 NOTE — Therapy (Signed)
[place] [facility] [place] [address] [place], [place], [address] Phone: [phone]   Fax:  [phone]  Physical Therapy Evaluation  Patient Details  Name: Molly Klein MRN: [id number] Date of Birth: 10-23-1960 Referring Provider:  Mcarthur Rossetti*  Encounter Date: 09/26/2014      PT End of Session - 09/26/14 0956    Visit Number 1   Date for PT Re-Evaluation 11/26/14   PT Start Time 0928   PT Stop Time 1018   PT Time Calculation (min) 50 min   Activity Tolerance Patient tolerated treatment well   Behavior During Therapy [facility] for tasks assessed/performed      Past Medical History  Diagnosis Date  . Asthma   . GERD (gastroesophageal reflux disease)   . Hepatitis C   . DVT (deep venous thrombosis)   . Nipple discharge     right breast  . Neuromuscular disorder     neuropathy lt arm  . OSA (obstructive sleep apnea)   . Bruising 10/21/2012  . History of DVT of lower extremity 10/21/2012    Past Surgical History  Procedure Laterality Date  . Cholecystectomy    . Pilonidal cyst excision    . Dilation and curettage of uterus    . Tonsillectomy    . Abdominal hysterectomy    . Fracture surgery      fx lt arm  . Breast biopsy  02/06/2011    Procedure: BREAST BIOPSY;  Surgeon: Earnstine Regal, MD;  Location: [facility];  Service: General;  Laterality: Right;  right breast biopsy    There were no vitals filed for this visit.  Visit Diagnosis:  Arthralgia of both knees - Plan: PT plan of care cert/re-cert  Knee stiffness, left - Plan: PT plan of care cert/re-cert  Knee stiffness, right - Plan: PT plan of care cert/re-cert  Difficulty walking - Plan: PT plan of care cert/re-cert      Subjective Assessment - 09/26/14 0934    Subjective Patient reports that about two months she was walking for exercise.  She started having pain in bilateral knees.  She stopped walking and exercising and  continues to have knee pain   Pertinent History has had right ACL repair 3 years ago.     How long can you stand comfortably? 10 minutes   How long can you walk comfortably? 5 minutes   Diagnostic tests x-rays were negative   Patient Stated Goals less pain and be able to walk better   Currently in Pain? Yes   Pain Score 8    Pain Location Neck   Pain Orientation Right;Left   Pain Descriptors / Indicators Aching;Sharp   Pain Type Chronic pain   Pain Onset More than a month ago   Pain Frequency Constant   Aggravating Factors  with walking and standing   Pain Relieving Factors rest and some pain meds pain to 2/10   Effect of Pain on Daily Activities limits everything            [facility] PT Assessment - 09/26/14 0001    Assessment   Medical Diagnosis bilateral knee pain   Onset Date/Surgical Date 07/27/14   Prior Therapy no   Precautions   Precautions None   Balance Screen   Has the patient fallen in the past 6 months No   Has the patient had a decrease in activity level because of a fear of falling?  No   Is  the patient reluctant to leave their home because of a fear of falling?  No   Home Ecologist residence   Additional Comments housework   Prior Function   Level of Independence Independent   Vocation Retired;On disability   Leisure walk for exercise   AROM   Overall AROM Comments right knee AROM 10-100 degrees flexion, AROM of the left knee 20-90 degrees flexion   Strength   Overall Strength Comments 3+/5 with pain   Flexibility   Soft Tissue Assessment /Muscle Length --  tight HS   Palpation   Patella mobility good   Palpation comment very tender to palpation left medial knee   Special Tests    Special Tests --  patient was too painful to do tests all motions cause pain   Ambulation/Gait   Gait Comments not using an assistive device, slow, very antalgic gait, appears to be in pain with any step, tends to want to creep along furniture  for support                   [facility] Adult PT Treatment/Exercise - 2014/10/15 0001    Modalities   Modalities Cryotherapy;Electrical Stimulation   Cryotherapy   Number Minutes Cryotherapy 15 Minutes   Cryotherapy Location Knee   Type of Cryotherapy Ice pack   Electrical Stimulation   Electrical Stimulation Location bilateral knees   Electrical Stimulation Action premod   Electrical Stimulation Parameters tolerance   Electrical Stimulation Goals Pain                  PT Short Term Goals - 10-15-14 0958    PT SHORT TERM GOAL #1   Title independent with initial HEP   Time 2   Period Weeks   Status New           PT Long Term Goals - 10/15/2014 0959    PT LONG TERM GOAL #1   Title walk 500 feet without increase of pain   Time 8   Period Weeks   Status New   PT LONG TERM GOAL #2   Title increase AROM of the right knee to 0-110 degrees flexion   Time 8   Period Weeks   Status New   PT LONG TERM GOAL #3   Title increase AROM of the left knee to 5-110 degrees flexion   Time 8   Period Weeks   Status New   PT LONG TERM GOAL #4   Title decrease pain 50%   Time 8   Period Weeks   Status New               Plan - 10/15/2014 0957    Clinical Impression Statement Patient with diagnosis from MD of right patellar tendonitis, left knee MCL strain, however she does not know a cause, reports 2 months of pain, very poor ROM and gait   Pt will benefit from skilled therapeutic intervention in order to improve on the following deficits Abnormal gait;Decreased activity tolerance;Decreased range of motion;Decreased mobility;Decreased strength;Difficulty walking;Pain   Rehab Potential Good   PT Frequency 2x / week   PT Duration 8 weeks   PT Treatment/Interventions Cryotherapy;Electrical Stimulation;Gait training;Ultrasound;Moist Heat;Iontophoresis 4mg /ml Dexamethasone;Stair training;Functional mobility training;Therapeutic activities;Therapeutic exercise;Balance  training;Manual techniques;Patient/family education   PT Next Visit Plan slowly add exercises, may try ionto   Consulted and Agree with Plan of Care Patient          G-Codes - 2014/10/15 1000  Functional Assessment Tool Used foto   Functional Limitation Mobility: Walking and moving around   Mobility: Walking and Moving Around Current Status [phone]) At least 60 percent but less than 80 percent impaired, limited or restricted   Mobility: Walking and Moving Around Goal Status [phone]) At least 40 percent but less than 60 percent impaired, limited or restricted       Problem List Patient Active Problem List   Diagnosis Date Noted  . Right wrist injury 08/26/2014  . Right anterior knee pain 08/26/2014  . Severe obesity (BMI >= 40) 03/12/2014  . Popliteal pain 12/20/2013  . Contusion of left calf 12/13/2013  . Acute bronchitis with asthma with acute exacerbation 05/15/2013  . Bruising 10/21/2012  . History of DVT of lower extremity 10/21/2012  . Acute thoracic back pain 09/28/2012  . Sinus congestion 04/01/2012  . Solitary pulmonary nodule 10/07/2011  . 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 Nipple discharge in female, right 02/04/2011  . UNSPECIFIED ESSENTIAL HYPERTENSION 02/06/2010  . STRESS INCONTINENCE 04/22/2009  . UTI 10/24/2008  . PATELLAR DISLOCATION, RIGHT 10/24/2008  . HEPATITIS C 10/08/2006  . SLEEP APNEA, OBSTRUCTIVE 10/08/2006  . NARCOLEPSY W/O CATAPLEXY 10/08/2006  . Mild persistent asthma 10/08/2006  . GERD 10/08/2006  . OVERACTIVE BLADDER 10/08/2006  . FIBROCYSTIC BREAST DISEASE 10/08/2006    Sumner Boast., PT 09/26/2014, 10:03 AM  [place] [address] [place], [place], [address] Phone: [phone]   Fax:  [phone]

## 2014-09-26 NOTE — Patient Instructions (Signed)
Quad Set   Slowly tighten thigh muscles of straight leg hold 2 seconds. Relax. Repeat _15___ times. Do __3__ sessions per day.  Short Arc [employer] a large can or rolled towel under leg. Straighten knee and leg. Hold _3___ seconds. Repeat with other leg. Repeat _15_ times. Do _3___ sessions per day.  PROM: Gastroc Stretch   Stand with right foot back, leg straight, forward leg bent. Keeping heel on floor, turned slightly out, lean into wall until stretch is felt in calf. Hold _30___ seconds. Repeat _3___ times per set. Do __2__ sets per session. Do __2__ sessions per day.   Knee Flexion   With towel around left heel, gently pull knee up with towel until stretch is felt. Hold _10__ seconds.  Repeat _10___ times per set. Do __2__ sets per session. Do __3__ sessions per day.

## 2014-10-01 ENCOUNTER — Encounter: Payer: Self-pay | Admitting: Physical Therapy

## 2014-10-01 ENCOUNTER — Ambulatory Visit: Payer: Medicare HMO | Admitting: Physical Therapy

## 2014-10-01 DIAGNOSIS — M25562 Pain in left knee: Principal | ICD-10-CM

## 2014-10-01 DIAGNOSIS — M25662 Stiffness of left knee, not elsewhere classified: Secondary | ICD-10-CM

## 2014-10-01 DIAGNOSIS — M25561 Pain in right knee: Secondary | ICD-10-CM

## 2014-10-01 DIAGNOSIS — R262 Difficulty in walking, not elsewhere classified: Secondary | ICD-10-CM

## 2014-10-01 DIAGNOSIS — M25661 Stiffness of right knee, not elsewhere classified: Secondary | ICD-10-CM

## 2014-10-01 NOTE — Therapy (Signed)
[place], [place], [address] Phone: [phone]   Fax:  [phone]  Physical Therapy Treatment  Patient Details  Name: Molly Klein MRN: [id number] Date of Birth: 07-29-1960 Referring Provider:  Mcarthur Rossetti*  Encounter Date: 10/01/2014      PT End of Session - 10/01/14 1017    Visit Number 2   PT Start Time 0932   PT Stop Time 1030   PT Time Calculation (min) 58 min   Activity Tolerance Patient tolerated treatment well   Behavior During Therapy [facility] for tasks assessed/performed      Past Medical History  Diagnosis Date  . Asthma   . GERD (gastroesophageal reflux disease)   . Hepatitis C   . DVT (deep venous thrombosis)   . Nipple discharge     right breast  . Neuromuscular disorder     neuropathy lt arm  . OSA (obstructive sleep apnea)   . Bruising 10/21/2012  . History of DVT of lower extremity 10/21/2012    Past Surgical History  Procedure Laterality Date  . Cholecystectomy    . Pilonidal cyst excision    . Dilation and curettage of uterus    . Tonsillectomy    . Abdominal hysterectomy    . Fracture surgery      fx lt arm  . Breast biopsy  02/06/2011    Procedure: BREAST BIOPSY;  Surgeon: Earnstine Regal, MD;  Location: [place];  Service: General;  Laterality: Right;  right breast biopsy    There were no vitals filed for this visit.  Visit Diagnosis:  Arthralgia of both knees  Knee stiffness, left  Knee stiffness, right  Difficulty walking      Subjective Assessment - 10/01/14 0935    Subjective Pt reports that she has been sore since last PT session    Pertinent History has had right ACL repair 3 years ago.     Pain Score 8    Pain Location Knee   Pain Orientation Right;Left   Pain Descriptors / Indicators Aching;Sharp   Pain Type Chronic pain                         [facility] Adult PT Treatment/Exercise -  10/01/14 0001    Exercises   Exercises Knee/Hip   Knee/Hip Exercises: Stretches   Passive Hamstring Stretch 10 seconds;5 reps;Both   Knee/Hip Exercises: Aerobic   Nustep L4 6 min    Knee/Hip Exercises: Standing   Hip ADduction 1 set;5 reps  #2    Hip Extension 1 set;10 reps;Knee straight;Both  #2   Knee/Hip Exercises: Seated   Long Arc Quad Both;1 set;10 reps   Long Arc Quad Weight 2 lbs.   Marching Both;10 reps;2 sets  #2   Hamstring Curl 1 set;Both;10 reps  yellow Tband    Electrical Stimulation   Electrical Stimulation Location bilateral knees   Electrical Stimulation Action premod   Electrical Stimulation Parameters to tolerance    Electrical Stimulation Goals Pain   Manual Therapy   Manual Therapy Passive ROM   Passive ROM LE flexion and extension                   PT Short Term Goals - 10/01/14 1023    PT SHORT TERM GOAL #1   Title independent with initial HEP   Status On-going  PT Long Term Goals - 09/26/14 0959    PT LONG TERM GOAL #1   Title walk 500 feet without increase of pain   Time 8   Period Weeks   Status New   PT LONG TERM GOAL #2   Title increase AROM of the right knee to 0-110 degrees flexion   Time 8   Period Weeks   Status New   PT LONG TERM GOAL #3   Title increase AROM of the left knee to 5-110 degrees flexion   Time 8   Period Weeks   Status New   PT LONG TERM GOAL #4   Title decrease pain 50%   Time 8   Period Weeks   Status New               Plan - 10/01/14 1017    Clinical Impression Statement Pt able to complete all intervention. Pt does have increase pain during treatment but able to push through.    Pt will benefit from skilled therapeutic intervention in order to improve on the following deficits Abnormal gait;Decreased activity tolerance;Decreased range of motion;Decreased mobility;Decreased strength;Difficulty walking;Pain   Rehab Potential Good   PT Frequency 2x / week   PT Duration 8  weeks   PT Treatment/Interventions Cryotherapy;Electrical Stimulation;Gait training;Ultrasound;Moist Heat;Iontophoresis 4mg /ml Dexamethasone;Stair training;Functional mobility training;Therapeutic activities;Therapeutic exercise;Balance training;Manual techniques;Patient/family education   PT Next Visit Plan continue to slowly add exercises, try ionto        Problem List Patient Active Problem List   Diagnosis Date Noted  . Right wrist injury 08/26/2014  . Right anterior knee pain 08/26/2014  . Severe obesity (BMI >= 40) 03/12/2014  . Popliteal pain 12/20/2013  . Contusion of left calf 12/13/2013  . Acute bronchitis with asthma with acute exacerbation 05/15/2013  . Bruising 10/21/2012  . History of DVT of lower extremity 10/21/2012  . Acute thoracic back pain 09/28/2012  . Sinus congestion 04/01/2012  . Solitary pulmonary nodule 10/07/2011  . Lichenoid keratosis 00/71/2197  . 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 Nipple discharge in female, right 02/04/2011  . UNSPECIFIED ESSENTIAL HYPERTENSION 02/06/2010  . STRESS INCONTINENCE 04/22/2009  . UTI 10/24/2008  . PATELLAR DISLOCATION, RIGHT 10/24/2008  . HEPATITIS C 10/08/2006  . SLEEP APNEA, OBSTRUCTIVE 10/08/2006  . NARCOLEPSY W/O CATAPLEXY 10/08/2006  . Mild persistent asthma 10/08/2006  . GERD 10/08/2006  . OVERACTIVE BLADDER 10/08/2006  . FIBROCYSTIC BREAST DISEASE 10/08/2006    Scot Jun, PTA  10/01/2014, 10:25 AM  [place], [place], [address] Phone: [phone]   Fax:  [phone]

## 2014-10-03 ENCOUNTER — Ambulatory Visit: Payer: Medicare HMO | Admitting: Physical Therapy

## 2014-10-10 ENCOUNTER — Ambulatory Visit: Payer: Medicare HMO | Attending: Orthopaedic Surgery | Admitting: Physical Therapy

## 2014-10-10 ENCOUNTER — Encounter: Payer: Self-pay | Admitting: Physical Therapy

## 2014-10-10 DIAGNOSIS — R262 Difficulty in walking, not elsewhere classified: Secondary | ICD-10-CM | POA: Insufficient documentation

## 2014-10-10 DIAGNOSIS — M25562 Pain in left knee: Secondary | ICD-10-CM | POA: Diagnosis present

## 2014-10-10 DIAGNOSIS — M25662 Stiffness of left knee, not elsewhere classified: Secondary | ICD-10-CM | POA: Diagnosis present

## 2014-10-10 DIAGNOSIS — M25561 Pain in right knee: Secondary | ICD-10-CM | POA: Insufficient documentation

## 2014-10-10 DIAGNOSIS — M25661 Stiffness of right knee, not elsewhere classified: Secondary | ICD-10-CM

## 2014-10-10 NOTE — Therapy (Signed)
[place] [address] [place], [place], [address] Phone: [phone]   Fax:  [phone]  Physical Therapy Treatment  Patient Details  Name: Molly Klein MRN: [id number] Date of Birth: [birth year] Referring Provider:  Mcarthur Rossetti*  Encounter Date: 10/10/2014      PT End of Session - 10/10/14 1511    Visit Number 3   Date for PT Re-Evaluation 11/26/14   PT Start Time 1431   PT Stop Time 1511   PT Time Calculation (min) 40 min   Activity Tolerance Patient limited by pain   Behavior During Therapy [facility] for tasks assessed/performed      Past Medical History  Diagnosis Date  . Asthma   . GERD (gastroesophageal reflux disease)   . Hepatitis C   . DVT (deep venous thrombosis)   . Nipple discharge     right breast  . Neuromuscular disorder     neuropathy lt arm  . OSA (obstructive sleep apnea)   . Bruising 10/21/2012  . History of DVT of lower extremity 10/21/2012    Past Surgical History  Procedure Laterality Date  . Cholecystectomy    . Pilonidal cyst excision    . Dilation and curettage of uterus    . Tonsillectomy    . Abdominal hysterectomy    . Fracture surgery      fx lt arm  . Breast biopsy  02/06/2011    Procedure: BREAST BIOPSY;  Surgeon: Earnstine Regal, MD;  Location: [facility];  Service: General;  Laterality: Right;  right breast biopsy    There were no vitals filed for this visit.  Visit Diagnosis:  Arthralgia of both knees  Knee stiffness, left  Knee stiffness, right  Difficulty walking      Subjective Assessment - 10/10/14 1432    Subjective Pt reports that she has some increased swelling in the ankles and knee. Pt reports that she has been on her feet a lot during vacation.     Pertinent History has had right ACL repair 3 years ago.     Currently in Pain? Yes   Pain Score 8    Pain Location Knee   Pain Orientation Right;Left   Pain Descriptors /  Indicators Aching;Sharp   Pain Type Chronic pain                         [facility] Adult PT Treatment/Exercise - 10/10/14 0001    Exercises   Exercises Knee/Hip   Knee/Hip Exercises: Aerobic   Nustep L4 6 min    Knee/Hip Exercises: Seated   Long Arc Quad Both;10 reps;2 sets   Marching Both;10 reps;2 sets   Hamstring Curl 1 set;Both;10 reps  yellow Tband    Abduction/Adduction  2 sets;20 reps  blue Tband    Sit to Sand 5 reps;without UE support   Modalities   Modalities Iontophoresis   Iontophoresis   Type of Iontophoresis Dexamethasone   Location medial L knee    Dose 3m   Time 4 hours                   PT Short Term Goals - 10/10/14 1514    PT SHORT TERM GOAL #1   Title independent with initial HEP   Status On-going           PT Long Term Goals - 10/10/14 1514    PT LONG TERM GOAL #  1   Title walk 500 feet without increase of pain   Status Not Met   PT LONG TERM GOAL #2   Status Not Met   PT LONG TERM GOAL #3   Title increase AROM of the left knee to 5-110 degrees flexion   Status Not Met   PT LONG TERM GOAL #4   Title decrease pain 50%   Status Not Met               Plan - 10/10/14 1511    Clinical Impression Statement Pt did not tolerate treatment well and has not made progress towards goals. Pt reports no improvement and advised to return to doctor.  Pt reports increased pain throughout treatment. Pt very hypersensitive to pain even wit light touch to R knee.    Pt will benefit from skilled therapeutic intervention in order to improve on the following deficits Abnormal gait;Decreased activity tolerance;Decreased range of motion;Decreased mobility;Decreased strength;Difficulty walking;Pain   PT Frequency 2x / week   PT Duration 8 weeks   PT Treatment/Interventions Cryotherapy;Electrical Stimulation;Gait training;Ultrasound;Moist Heat;Iontophoresis 29m/ml Dexamethasone;Stair training;Functional mobility training;Therapeutic  activities;Therapeutic exercise;Balance training;Manual techniques;Patient/family education   PT Next Visit Plan Hold PT until Pt sees doctor    Consulted and Agree with Plan of Care Patient        Problem List Patient Active Problem List   Diagnosis Date Noted  . Right wrist injury 08/26/2014  . Right anterior knee pain 08/26/2014  . Severe obesity (BMI >= 40) 03/12/2014  . Popliteal pain 12/20/2013  . Contusion of left calf 12/13/2013  . Acute bronchitis with asthma with acute exacerbation 05/15/2013  . Bruising 10/21/2012  . History of DVT of lower extremity 10/21/2012  . Acute thoracic back pain 09/28/2012  . Sinus congestion 04/01/2012  . Solitary pulmonary nodule 10/07/2011  . Lichenoid keratosis 033/35/4562 . 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 Nipple discharge in female, right 02/04/2011  . UNSPECIFIED ESSENTIAL HYPERTENSION 02/06/2010  . STRESS INCONTINENCE 04/22/2009  . UTI 10/24/2008  . PATELLAR DISLOCATION, RIGHT 10/24/2008  . HEPATITIS C 10/08/2006  . SLEEP APNEA, OBSTRUCTIVE 10/08/2006  . NARCOLEPSY W/O CATAPLEXY 10/08/2006  . Mild persistent asthma 10/08/2006  . GERD 10/08/2006  . OVERACTIVE BLADDER 10/08/2006  . FIBROCYSTIC BREAST DISEASE 10/08/2006    RScot Jun PTA 10/10/2014, 3:16 PM  [address] [address]Phone: [phone]  Fax:  [phone]

## 2014-10-12 ENCOUNTER — Ambulatory Visit: Payer: Medicare HMO | Admitting: Physical Therapy

## 2014-10-23 ENCOUNTER — Other Ambulatory Visit: Payer: Self-pay | Admitting: Physician Assistant

## 2014-10-23 DIAGNOSIS — M25562 Pain in left knee: Secondary | ICD-10-CM

## 2014-11-01 ENCOUNTER — Ambulatory Visit
Admission: RE | Admit: 2014-11-01 | Discharge: 2014-11-01 | Disposition: A | Discharge status: Home / Self Care | Payer: Medicare HMO | Source: Ambulatory Visit | Attending: Physician Assistant | Admitting: Physician Assistant

## 2014-11-01 DIAGNOSIS — M25562 Pain in left knee: Secondary | ICD-10-CM

## 2014-11-12 ENCOUNTER — Ambulatory Visit (INDEPENDENT_AMBULATORY_CARE_PROVIDER_SITE_OTHER): Payer: Medicare HMO

## 2014-11-12 VITALS — BP 110/80 | HR 75 | Ht 63.0 in | Wt 247.4 lb

## 2014-11-12 DIAGNOSIS — Z0000 Encounter for general adult medical examination without abnormal findings: Secondary | ICD-10-CM | POA: Diagnosis not present

## 2014-11-12 NOTE — Patient Instructions (Addendum)
Follow up with Dr. Etter Sjogren as scheduled.  Eat heart healthy diet and exercise.   Try using Lose it or MyFitness Pal to track calorie intake and exercise.   Look for ways to manage stress.     Health Maintenance, Female Adopting a healthy lifestyle and getting preventive care can go a long way to promote health and wellness. Talk with your health care provider about what schedule of regular examinations is right for you. This is a good chance for you to check in with your provider about disease prevention and staying healthy. In between checkups, there are plenty of things you can do on your own. Experts have done a lot of research about which lifestyle changes and preventive measures are most likely to keep you healthy. Ask your health care provider for more information. WEIGHT AND DIET  Eat a healthy diet  Be sure to include plenty of vegetables, fruits, low-fat dairy products, and lean protein.  Do not eat a lot of foods high in solid fats, added sugars, or salt.  Get regular exercise. This is one of the most important things you can do for your health.  Most adults should exercise for at least 150 minutes each week. The exercise should increase your heart rate and make you sweat (moderate-intensity exercise).  Most adults should also do strengthening exercises at least twice a week. This is in addition to the moderate-intensity exercise.  Maintain a healthy weight  Body mass index (BMI) is a measurement that can be used to identify possible weight problems. It estimates body fat based on height and weight. Your health care provider can help determine your BMI and help you achieve or maintain a healthy weight.  For females 32 years of age and older:   A BMI below 18.5 is considered underweight.  A BMI of 18.5 to 24.9 is normal.  A BMI of 25 to 29.9 is considered overweight.  A BMI of 30 and above is considered obese.  Watch levels of cholesterol and blood lipids  You should  start having your blood tested for lipids and cholesterol at 56 years of age, then have this test every 5 years.  You may need to have your cholesterol levels checked more often if:  Your lipid or cholesterol levels are high.  You are older than 56 years of age.  You are at high risk for heart disease.  CANCER SCREENING   Lung Cancer  Lung cancer screening is recommended for adults 49-37 years old who are at high risk for lung cancer because of a history of smoking.  A yearly low-dose CT scan of the lungs is recommended for people who:  Currently smoke.  Have quit within the past 15 years.  Have at least a 30-pack-year history of smoking. A pack year is smoking an average of one pack of cigarettes a day for 1 year.  Yearly screening should continue until it has been 15 years since you quit.  Yearly screening should stop if you develop a health problem that would prevent you from having lung cancer treatment.  Breast Cancer  Practice breast self-awareness. This means understanding how your breasts normally appear and feel.  It also means doing regular breast self-exams. Let your health care provider know about any changes, no matter how small.  If you are in your 20s or 30s, you should have a clinical breast exam (CBE) by a health care provider every 1-3 years as part of a regular health exam.  If  you are 40 or older, have a CBE every year. Also consider having a breast X-ray (mammogram) every year.  If you have a family history of breast cancer, talk to your health care provider about genetic screening.  If you are at high risk for breast cancer, talk to your health care provider about having an MRI and a mammogram every year.  Breast cancer gene (BRCA) assessment is recommended for women who have family members with BRCA-related cancers. BRCA-related cancers include:  Breast.  Ovarian.  Tubal.  Peritoneal cancers.  Results of the assessment will determine the  need for genetic counseling and BRCA1 and BRCA2 testing. Cervical Cancer Your health care provider may recommend that you be screened regularly for cancer of the pelvic organs (ovaries, uterus, and vagina). This screening involves a pelvic examination, including checking for microscopic changes to the surface of your cervix (Pap test). You may be encouraged to have this screening done every 3 years, beginning at age 1.  For women ages 54-65, health care providers may recommend pelvic exams and Pap testing every 3 years, or they may recommend the Pap and pelvic exam, combined with testing for human papilloma virus (HPV), every 5 years. Some types of HPV increase your risk of cervical cancer. Testing for HPV may also be done on women of any age with unclear Pap test results.  Other health care providers may not recommend any screening for nonpregnant women who are considered low risk for pelvic cancer and who do not have symptoms. Ask your health care provider if a screening pelvic exam is right for you.  If you have had past treatment for cervical cancer or a condition that could lead to cancer, you need Pap tests and screening for cancer for at least 20 years after your treatment. If Pap tests have been discontinued, your risk factors (such as having a new sexual partner) need to be reassessed to determine if screening should resume. Some women have medical problems that increase the chance of getting cervical cancer. In these cases, your health care provider may recommend more frequent screening and Pap tests. Colorectal Cancer  This type of cancer can be detected and often prevented.  Routine colorectal cancer screening usually begins at 56 years of age and continues through 56 years of age.  Your health care provider may recommend screening at an earlier age if you have risk factors for colon cancer.  Your health care provider may also recommend using home test kits to check for hidden blood in  the stool.  A small camera at the end of a tube can be used to examine your colon directly (sigmoidoscopy or colonoscopy). This is done to check for the earliest forms of colorectal cancer.  Routine screening usually begins at age 56.  Direct examination of the colon should be repeated every 5-10 years through 56 years of age. However, you may need to be screened more often if early forms of precancerous polyps or small growths are found. Skin Cancer  Check your skin from head to toe regularly.  Tell your health care provider about any new moles or changes in moles, especially if there is a change in a mole's shape or color.  Also tell your health care provider if you have a mole that is larger than the size of a pencil eraser.  Always use sunscreen. Apply sunscreen liberally and repeatedly throughout the day.  Protect yourself by wearing long sleeves, pants, a wide-brimmed hat, and sunglasses whenever you are  outside. HEART DISEASE, DIABETES, AND HIGH BLOOD PRESSURE   High blood pressure causes heart disease and increases the risk of stroke. High blood pressure is more likely to develop in:  People who have blood pressure in the high end of the normal range (130-139/85-89 mm Hg).  People who are overweight or obese.  People who are African American.  If you are 18-39 years of age, have your blood pressure checked every 3-5 years. If you are 40 years of age or older, have your blood pressure checked every year. You should have your blood pressure measured twice--once when you are at a hospital or clinic, and once when you are not at a hospital or clinic. Record the average of the two measurements. To check your blood pressure when you are not at a hospital or clinic, you can use:  An automated blood pressure machine at a pharmacy.  A home blood pressure monitor.  If you are between 55 years and 79 years old, ask your health care provider if you should take aspirin to prevent  strokes.  Have regular diabetes screenings. This involves taking a blood sample to check your fasting blood sugar level.  If you are at a normal weight and have a low risk for diabetes, have this test once every three years after 56 years of age.  If you are overweight and have a high risk for diabetes, consider being tested at a younger age or more often. PREVENTING INFECTION  Hepatitis B  If you have a higher risk for hepatitis B, you should be screened for this virus. You are considered at high risk for hepatitis B if:  You were born in a country where hepatitis B is common. Ask your health care provider which countries are considered high risk.  Your parents were born in a high-risk country, and you have not been immunized against hepatitis B (hepatitis B vaccine).  You have HIV or AIDS.  You use needles to inject street drugs.  You live with someone who has hepatitis B.  You have had sex with someone who has hepatitis B.  You get hemodialysis treatment.  You take certain medicines for conditions, including cancer, organ transplantation, and autoimmune conditions. Hepatitis C  Blood testing is recommended for:  Everyone born from 1945 through 1965.  Anyone with known risk factors for hepatitis C. Sexually transmitted infections (STIs)  You should be screened for sexually transmitted infections (STIs) including gonorrhea and chlamydia if:  You are sexually active and are younger than 56 years of age.  You are older than 56 years of age and your health care provider tells you that you are at risk for this type of infection.  Your sexual activity has changed since you were last screened and you are at an increased risk for chlamydia or gonorrhea. Ask your health care provider if you are at risk.  If you do not have HIV, but are at risk, it may be recommended that you take a prescription medicine daily to prevent HIV infection. This is called pre-exposure prophylaxis  (PrEP). You are considered at risk if:  You are sexually active and do not regularly use condoms or know the HIV status of your partner(s).  You take drugs by injection.  You are sexually active with a partner who has HIV. Talk with your health care provider about whether you are at high risk of being infected with HIV. If you choose to begin PrEP, you should first be tested for HIV.   You should then be tested every 3 months for as long as you are taking PrEP.  PREGNANCY   If you are premenopausal and you may become pregnant, ask your health care provider about preconception counseling.  If you may become pregnant, take 400 to 800 micrograms (mcg) of folic acid every day.  If you want to prevent pregnancy, talk to your health care provider about birth control (contraception). OSTEOPOROSIS AND MENOPAUSE   Osteoporosis is a disease in which the bones lose minerals and strength with aging. This can result in serious bone fractures. Your risk for osteoporosis can be identified using a bone density scan.  If you are 36 years of age or older, or if you are at risk for osteoporosis and fractures, ask your health care provider if you should be screened.  Ask your health care provider whether you should take a calcium or vitamin D supplement to lower your risk for osteoporosis.  Menopause may have certain physical symptoms and risks.  Hormone replacement therapy may reduce some of these symptoms and risks. Talk to your health care provider about whether hormone replacement therapy is right for you.  HOME CARE INSTRUCTIONS   Schedule regular health, dental, and eye exams.  Stay current with your immunizations.   Do not use any tobacco products including cigarettes, chewing tobacco, or electronic cigarettes.  If you are pregnant, do not drink alcohol.  If you are breastfeeding, limit how much and how often you drink alcohol.  Limit alcohol intake to no more than 1 drink per day for  nonpregnant women. One drink equals 12 ounces of beer, 5 ounces of wine, or 1 ounces of hard liquor.  Do not use street drugs.  Do not share needles.  Ask your health care provider for help if you need support or information about quitting drugs.  Tell your health care provider if you often feel depressed.  Tell your health care provider if you have ever been abused or do not feel safe at home.   This information is not intended to replace advice given to you by your health care provider. Make sure you discuss any questions you have with your health care provider.   Document Released: 08/04/2010 Document Revised: 02/09/2014 Document Reviewed: 12/21/2012 Elsevier Interactive Patient Education [employer].

## 2014-11-12 NOTE — Progress Notes (Signed)
Subjective:   Molly Klein is a 54 y.o. female who presents for an Initial Medicare Annual Wellness Visit.    Review of Systems: No ROS  Sleep patterns:  Wears CPAP at night.  Sleeps approx. 8 hours at night.   Home Safety/Smoke Alarms: Feels safe at home.  Lives at home with husband.  Smoke alarms present.   Firearm Safety:  No firearms.   Seat Belt Safety/Bike Helmet:  Always wears seat belt and helmet.    Counseling:   Eye Exam- 03/2014 Dental- Goes once a year.  Looking for another dentist.   Female:  Pap-Hysterectomy      Mammo- 12/19/13     CCS- Pt states completed. Dr. Arta [address].       Objective:    Today's Vitals   11/12/14 0835  BP: 110/80  Pulse: 75  Height: 5\' 3"  (1.6 m)  Weight: 247 lb 6.4 oz (112.22 kg)  SpO2: 99%  PainSc: 8   PainLoc: Knee    Current Medications (verified) Outpatient Encounter Prescriptions as of 11/12/2014  Medication Sig  . acetaminophen-codeine (TYLENOL #3) 300-30 MG tablet Take 1 tablet by mouth at bedtime.  Marland Kitchen albuterol (PROVENTIL HFA;VENTOLIN HFA) 108 (90 BASE) MCG/ACT inhaler Inhale 2 puffs into the lungs 4 (four) times daily as needed. Shortness of breath  . albuterol (PROVENTIL) (2.5 MG/3ML) 0.083% nebulizer solution INHALE 1 VIAL BY NEBULIZER AS DIRECTED  . meloxicam (MOBIC) 15 MG tablet Take 1 tablet (15 mg total) by mouth daily.  . ranitidine (ZANTAC) 150 MG tablet Take 1 tablet (150 mg total) by mouth 2 (two) times daily.  . valACYclovir (VALTREX) 1000 MG tablet Take 1 tablet (1,000 mg total) by mouth 3 (three) times daily.  . beclomethasone (QVAR) 40 MCG/ACT inhaler Inhale 2 puffs into the lungs 2 (two) times daily. (Patient not taking: Reported on 11/12/2014)  . cyclobenzaprine (FLEXERIL) 10 MG tablet Take 1 tablet (10 mg total) by mouth at bedtime as needed for muscle spasms. (Patient not taking: Reported on 08/24/2014)  . gabapentin (NEURONTIN) 300 MG capsule Take 1 capsule (300 mg total) by mouth every  evening. (Patient not taking: Reported on 11/12/2014)   No facility-administered encounter medications on file as of 11/12/2014.    Allergies (verified) Codeine; Interferon beta-1a; and Sulfonamide derivatives   History: Past Medical History  Diagnosis Date  . Asthma   . GERD (gastroesophageal reflux disease)   . Hepatitis C   . DVT (deep venous thrombosis) ([place])   . Nipple discharge     right breast  . Neuromuscular disorder (HCC)     neuropathy lt arm  . OSA (obstructive sleep apnea)   . Bruising 10/21/2012  . History of DVT of lower extremity 10/21/2012  . Knee pain     left    Past Surgical History  Procedure Laterality Date  . Cholecystectomy    . Pilonidal cyst excision    . Dilation and curettage of uterus    . Tonsillectomy    . Abdominal hysterectomy    . Fracture surgery      fx lt arm  . Breast biopsy  02/06/2011    Procedure: BREAST BIOPSY;  Surgeon: Earnstine Regal, MD;  Location: [facility];  Service: General;  Laterality: Right;  right breast biopsy  . Right knee surgery    . Nipple repair     Family History  Problem Relation Age of Onset  . Rectal cancer Maternal Grandmother   . Cancer Sister  pt unaware of what kind  . Cancer Maternal Grandmother     breast  . Heart attack Mother     mi in 42s  . Alcohol abuse Mother     cirrhosis of liver  . Heart attack Father     MI in 80s   Social History   Occupational History  . retired    Social History Main Topics  . Smoking status: Former Smoker -- 5 years    Types: Cigarettes    Quit date: 02/03/1975  . Smokeless tobacco: Never Used     Comment: 1 pack per week  . Alcohol Use: No  . Drug Use: No  . Sexual Activity:    Partners: Male    Tobacco Counseling Counseling given: Yes   Activities of Daily Living In your present state of health, do you have any difficulty performing the following activities: 11/12/2014 03/12/2014  Hearing? N N  Vision? N N  Difficulty  concentrating or making decisions? N N  Walking or climbing stairs? Y N  Dressing or bathing? N N  Doing errands, shopping? N N  Preparing Food and eating ? N -  Using the Toilet? N -  In the past six months, have you accidently leaked urine? N -  Do you have problems with loss of bowel control? N -  Managing your Medications? N -  Managing your Finances? N -  Housekeeping or managing your Housekeeping? N -    Immunizations and Health Maintenance Immunization History  Administered Date(s) Administered  . Influenza Split 10/22/2011  . Influenza,inj,Quad PF,36+ Mos 11/02/2013  . Td 10/01/2003   Health Maintenance Due  Topic Date Due  . Samul Dada  09/30/2013    Patient Care Team: Rosalita Chessman, DO as PCP - General Calton Dach, MD as Referring Physician (Optometry) Chesley Mires, MD as Consulting Physician (Pulmonary Disease) Juanita Craver, MD as Consulting Physician (Gastroenterology)  Indicate any recent Medical Services you may have received from other than [facility] providers in the past year (date may be approximate).     Assessment:   This is a routine wellness examination for [facility].   Morbid obesity- BMI-43.84 kg/m2.  Discussed the importance of diet and exercise. Encouraged use of LoseIt or MyFitness PAL.    Essential Hypertension- stable. Currently not on medication.  Followed by Dr. Etter Sjogren.  Obstructive sleep apnea- Wears CPAP.  Sleep much improved when she wears CPAP.  Importance of diet and exercise discussed with patient.  Followed by Dr. Halford Chessman.     Hearing/Vision screen  Hearing Screening   125Hz  250Hz  500Hz  1000Hz  2000Hz  4000Hz  8000Hz   Right ear:   Pass Pass Pass Pass   Left ear:    Pass Pass Pass   Vision Screening Comments: Has noticed some changes in vision Wears corrective glasses Last Eye Exam: 03/2014 [facility]  Dietary issues and exercise activities discussed: Current Exercise Habits:: Exercise is limited by, Limited by:: orthopedic  condition(s) (Left knee)   Diet:  States diet could be improved.  Sometimes does Herbalife-1 shake a day.  Snack on chips and popsicles.    Goals    . Weight < 200 lb (90.719 kg)      Depression Screen PHQ 2/9 Scores 11/12/2014  PHQ - 2 Score 0    Fall Risk Fall Risk  11/12/2014  Falls in the past year? No    Cognitive Function: AD8 Completed.  Score 0/8.    Screening Tests Health Maintenance  Topic Date Due  .  TETANUS/TDAP  09/30/2013  . INFLUENZA VACCINE  03/13/2015 (Originally 09/03/2014)  . MAMMOGRAM  01/06/2016  . PAP SMEAR  03/12/2017  . COLONOSCOPY  03/13/2019  . Hepatitis C Screening  Completed  . HIV Screening  Completed      Plan:  Follow up with Dr. Etter Sjogren as scheduled.  Eat heart healthy diet and exercise.   Try using Lose it or MyFitness Pal to track calorie intake and exercise.   Look for ways to manage stress.     During the course of the visit, Staceyann was educated and counseled about the following appropriate screening and preventive services:   Vaccines to include Pneumoccal, Influenza, Hepatitis B, Td, Zostavax, HCV  Electrocardiogram  Cardiovascular disease screening  Colorectal cancer screening  Bone density screening  Diabetes screening  Glaucoma screening  Mammography/PAP  Nutrition counseling  Smoking cessation counseling  Patient Instructions (the written plan) were given to the patient.    Rudene Anda, RN   11/12/2014

## 2014-11-12 NOTE — Progress Notes (Signed)
Pre visit review using our clinic review tool, if applicable. No additional management support is needed unless otherwise documented below in the visit note. 

## 2014-11-12 NOTE — Progress Notes (Signed)
reviewed

## 2014-11-15 DIAGNOSIS — S83207D Unspecified tear of unspecified meniscus, current injury, left knee, subsequent encounter: Secondary | ICD-10-CM | POA: Diagnosis not present

## 2014-11-15 DIAGNOSIS — M25562 Pain in left knee: Secondary | ICD-10-CM | POA: Diagnosis not present

## 2014-11-29 DIAGNOSIS — J45909 Unspecified asthma, uncomplicated: Secondary | ICD-10-CM | POA: Diagnosis not present

## 2014-12-07 DIAGNOSIS — G4733 Obstructive sleep apnea (adult) (pediatric): Secondary | ICD-10-CM | POA: Diagnosis not present

## 2014-12-12 DIAGNOSIS — M25562 Pain in left knee: Secondary | ICD-10-CM | POA: Diagnosis not present

## 2014-12-30 DIAGNOSIS — J45909 Unspecified asthma, uncomplicated: Secondary | ICD-10-CM | POA: Diagnosis not present

## 2015-01-02 DIAGNOSIS — M25562 Pain in left knee: Secondary | ICD-10-CM | POA: Diagnosis not present

## 2015-01-21 DIAGNOSIS — G4733 Obstructive sleep apnea (adult) (pediatric): Secondary | ICD-10-CM | POA: Diagnosis not present

## 2015-01-29 DIAGNOSIS — J45909 Unspecified asthma, uncomplicated: Secondary | ICD-10-CM | POA: Diagnosis not present

## 2015-01-30 DIAGNOSIS — L708 Other acne: Secondary | ICD-10-CM | POA: Diagnosis not present

## 2015-01-30 DIAGNOSIS — L821 Other seborrheic keratosis: Secondary | ICD-10-CM | POA: Diagnosis not present

## 2015-02-14 DIAGNOSIS — M23222 Derangement of posterior horn of medial meniscus due to old tear or injury, left knee: Secondary | ICD-10-CM | POA: Diagnosis not present

## 2015-02-14 DIAGNOSIS — G8918 Other acute postprocedural pain: Secondary | ICD-10-CM | POA: Diagnosis not present

## 2015-02-14 DIAGNOSIS — Y939 Activity, unspecified: Secondary | ICD-10-CM | POA: Diagnosis not present

## 2015-02-14 DIAGNOSIS — Y929 Unspecified place or not applicable: Secondary | ICD-10-CM | POA: Diagnosis not present

## 2015-02-14 DIAGNOSIS — S83242A Other tear of medial meniscus, current injury, left knee, initial encounter: Secondary | ICD-10-CM | POA: Diagnosis not present

## 2015-02-14 DIAGNOSIS — M23204 Derangement of unspecified medial meniscus due to old tear or injury, left knee: Secondary | ICD-10-CM | POA: Diagnosis not present

## 2015-02-14 DIAGNOSIS — Y999 Unspecified external cause status: Secondary | ICD-10-CM | POA: Diagnosis not present

## 2015-03-01 DIAGNOSIS — J45909 Unspecified asthma, uncomplicated: Secondary | ICD-10-CM | POA: Diagnosis not present

## 2015-03-15 ENCOUNTER — Encounter: Payer: Medicare HMO | Admitting: Family Medicine

## 2015-03-20 DIAGNOSIS — M25562 Pain in left knee: Secondary | ICD-10-CM | POA: Diagnosis not present

## 2015-03-26 DIAGNOSIS — G4733 Obstructive sleep apnea (adult) (pediatric): Secondary | ICD-10-CM | POA: Diagnosis not present

## 2015-04-01 DIAGNOSIS — J45909 Unspecified asthma, uncomplicated: Secondary | ICD-10-CM | POA: Diagnosis not present

## 2015-04-03 ENCOUNTER — Telehealth: Payer: Self-pay | Admitting: *Deleted

## 2015-04-03 NOTE — Telephone Encounter (Signed)
Received fax for Certification for Medical Necessity for Oxygen; forwarded to provider/SLS 03/01

## 2015-04-09 DIAGNOSIS — R1310 Dysphagia, unspecified: Secondary | ICD-10-CM | POA: Diagnosis not present

## 2015-04-09 DIAGNOSIS — I10 Essential (primary) hypertension: Secondary | ICD-10-CM | POA: Diagnosis not present

## 2015-04-09 DIAGNOSIS — Z6841 Body Mass Index (BMI) 40.0 and over, adult: Secondary | ICD-10-CM | POA: Diagnosis not present

## 2015-04-09 DIAGNOSIS — Z8249 Family history of ischemic heart disease and other diseases of the circulatory system: Secondary | ICD-10-CM | POA: Diagnosis not present

## 2015-04-09 DIAGNOSIS — M1712 Unilateral primary osteoarthritis, left knee: Secondary | ICD-10-CM | POA: Diagnosis not present

## 2015-04-09 DIAGNOSIS — K219 Gastro-esophageal reflux disease without esophagitis: Secondary | ICD-10-CM | POA: Diagnosis not present

## 2015-04-09 DIAGNOSIS — M20002 Unspecified deformity of left finger(s): Secondary | ICD-10-CM | POA: Diagnosis not present

## 2015-04-09 DIAGNOSIS — Z809 Family history of malignant neoplasm, unspecified: Secondary | ICD-10-CM | POA: Diagnosis not present

## 2015-04-09 DIAGNOSIS — J45909 Unspecified asthma, uncomplicated: Secondary | ICD-10-CM | POA: Diagnosis not present

## 2015-04-16 ENCOUNTER — Encounter: Payer: Self-pay | Admitting: Physician Assistant

## 2015-04-16 ENCOUNTER — Ambulatory Visit (INDEPENDENT_AMBULATORY_CARE_PROVIDER_SITE_OTHER): Payer: Medicare HMO | Admitting: Physician Assistant

## 2015-04-16 VITALS — BP 148/84 | HR 90 | Temp 98.6°F | Ht 63.0 in | Wt 244.8 lb

## 2015-04-16 DIAGNOSIS — H65191 Other acute nonsuppurative otitis media, right ear: Secondary | ICD-10-CM | POA: Diagnosis not present

## 2015-04-16 DIAGNOSIS — J069 Acute upper respiratory infection, unspecified: Secondary | ICD-10-CM | POA: Diagnosis not present

## 2015-04-16 DIAGNOSIS — B9789 Other viral agents as the cause of diseases classified elsewhere: Principal | ICD-10-CM

## 2015-04-16 LAB — POC INFLUENZA A&B (BINAX/QUICKVUE)
INFLUENZA A, POC: NEGATIVE
INFLUENZA B, POC: NEGATIVE

## 2015-04-16 MED ORDER — BENZONATATE 100 MG PO CAPS: 100.0000 mg | ORAL_CAPSULE | Freq: Two times a day (BID) | ORAL | Status: DC | PRN

## 2015-04-16 MED ORDER — AMOXICILLIN 500 MG PO CAPS: 500.0000 mg | ORAL_CAPSULE | Freq: Three times a day (TID) | ORAL | Status: DC

## 2015-04-16 NOTE — Progress Notes (Signed)
Patient presents to clinic today c/o 2 days of dry cough, chills and fatigue with R sided ear pain. Denies fever, chest congestion, head congestion. Endorses husband has sick with similar symptoms. Is an asthmatic but denies any SOB or wheeZing.  Past Medical History  Diagnosis Date  . Asthma   . GERD (gastroesophageal reflux disease)   . Hepatitis C   . DVT (deep venous thrombosis) ([place])   . Nipple discharge     right breast  . Neuromuscular disorder (HCC)     neuropathy lt arm  . OSA (obstructive sleep apnea)   . Bruising 10/21/2012  . History of DVT of lower extremity 10/21/2012  . Knee pain     left     Current Outpatient Prescriptions on File Prior to Visit  Medication Sig Dispense Refill  . albuterol (PROVENTIL HFA;VENTOLIN HFA) 108 (90 BASE) MCG/ACT inhaler Inhale 2 puffs into the lungs 4 (four) times daily as needed. Shortness of breath 1 Inhaler 6  . albuterol (PROVENTIL) (2.5 MG/3ML) 0.083% nebulizer solution INHALE 1 VIAL BY NEBULIZER AS DIRECTED 75 mL 0  . beclomethasone (QVAR) 40 MCG/ACT inhaler Inhale 2 puffs into the lungs 2 (two) times daily. 1 Inhaler 12  . cyclobenzaprine (FLEXERIL) 10 MG tablet Take 1 tablet (10 mg total) by mouth at bedtime as needed for muscle spasms. 21 tablet 0  . gabapentin (NEURONTIN) 300 MG capsule Take 1 capsule (300 mg total) by mouth every evening. 90 capsule 0  . ranitidine (ZANTAC) 150 MG tablet Take 1 tablet (150 mg total) by mouth 2 (two) times daily. 60 tablet 3  . acetaminophen-codeine (TYLENOL #3) 300-30 MG tablet Take 1 tablet by mouth at bedtime. Reported on 04/16/2015    . meloxicam (MOBIC) 15 MG tablet Take 1 tablet (15 mg total) by mouth daily. (Patient not taking: Reported on 04/16/2015) 30 tablet 0  . valACYclovir (VALTREX) 1000 MG tablet Take 1 tablet (1,000 mg total) by mouth 3 (three) times daily. (Patient not taking: Reported on 04/16/2015) 30 tablet 0   No current facility-administered medications on file prior to visit.     Allergies  Allergen Reactions  . Codeine Nausea And Vomiting  . Interferon Beta-1a   . Sulfonamide Derivatives Nausea Only    Family History  Problem Relation Age of Onset  . Rectal cancer Maternal Grandmother   . Cancer Sister     pt unaware of what kind  . Cancer Maternal Grandmother     breast  . Heart attack Mother     mi in 55s  . Alcohol abuse Mother     cirrhosis of liver  . Heart attack Father     MI in 2s    Social History   Social History  . Marital Status: Married    Spouse Name: N/A  . Number of Children: N/A  . Years of Education: N/A   Occupational History  . retired    Social History Main Topics  . Smoking status: Former Smoker -- 5 years    Types: Cigarettes    Quit date: 02/03/1975  . Smokeless tobacco: Never Used     Comment: 1 pack per week  . Alcohol Use: No  . Drug Use: No  . Sexual Activity:    Partners: Male   Other Topics Concern  . None   Social History Narrative   Exercise-- 2-3 days a week   Review of Systems - See HPI.  All other ROS are negative.  BP 148/84  mmHg  Pulse 90  Temp(Src) 98.6 F (37 C) (Oral)  Ht 5\' 3"  (1.6 m)  Wt 244 lb 12.8 oz (111.041 kg)  BMI 43.38 kg/m2  SpO2 98%  Physical Exam  Constitutional: She is oriented to person, place, and time and well-developed, well-nourished, and in no distress.  HENT:  Head: Normocephalic and atraumatic.  Right Ear: External ear normal. Tympanic membrane is erythematous and bulging.  Left Ear: External ear normal.  Nose: Nose normal.  Mouth/Throat: Uvula is midline, oropharynx is clear and moist and mucous membranes are normal. No oropharyngeal exudate.  Eyes: Conjunctivae are normal.  Neck: Neck supple.  Cardiovascular: Normal rate, regular rhythm, normal heart sounds and intact distal pulses.   Pulmonary/Chest: Effort normal and breath sounds normal. No respiratory distress. She has no wheezes. She has no rales. She exhibits no tenderness.  Neurological: She  is alert and oriented to person, place, and time.  Skin: Skin is warm and dry. No rash noted.  Psychiatric: Affect normal.  Vitals reviewed.  Assessment/Plan: Viral URI w cough -- flu swab negative. Supportive measures and OTC meds reviewed. Rx. Tessalon.  R AOM -- without rupture. Rx Amoxicillin to take with food. Supportive measures reviewed.  Follow-up PRN

## 2015-04-16 NOTE — Progress Notes (Signed)
Pre visit review using our clinic review tool, if applicable. No additional management support is needed unless otherwise documented below in the visit note. 

## 2015-04-16 NOTE — Patient Instructions (Signed)
Please stay hydrated and get plenty of rest. Place a humidifier in the bedroom. Use Tessalon as directed for cough. Plain Mucinex to thin mucous. Take antibiotic as directed with food.  Your viral symptoms can last up to a week but usually day 2-3 are the worst. Your body will fight this off but the measures above will help.  For the ear infection, the antibiotic should take care of things.

## 2015-04-27 ENCOUNTER — Other Ambulatory Visit: Payer: Self-pay | Admitting: Medical

## 2015-04-29 DIAGNOSIS — J45909 Unspecified asthma, uncomplicated: Secondary | ICD-10-CM | POA: Diagnosis not present

## 2015-05-30 DIAGNOSIS — J45909 Unspecified asthma, uncomplicated: Secondary | ICD-10-CM | POA: Diagnosis not present

## 2015-06-27 DIAGNOSIS — G4733 Obstructive sleep apnea (adult) (pediatric): Secondary | ICD-10-CM | POA: Diagnosis not present

## 2015-06-29 DIAGNOSIS — J45909 Unspecified asthma, uncomplicated: Secondary | ICD-10-CM | POA: Diagnosis not present

## 2015-07-08 DIAGNOSIS — H2513 Age-related nuclear cataract, bilateral: Secondary | ICD-10-CM | POA: Diagnosis not present

## 2015-07-08 DIAGNOSIS — H524 Presbyopia: Secondary | ICD-10-CM | POA: Diagnosis not present

## 2015-07-08 DIAGNOSIS — H40023 Open angle with borderline findings, high risk, bilateral: Secondary | ICD-10-CM | POA: Diagnosis not present

## 2015-07-08 DIAGNOSIS — H18413 Arcus senilis, bilateral: Secondary | ICD-10-CM | POA: Diagnosis not present

## 2015-07-08 DIAGNOSIS — H5203 Hypermetropia, bilateral: Secondary | ICD-10-CM | POA: Diagnosis not present

## 2015-07-08 DIAGNOSIS — H40033 Anatomical narrow angle, bilateral: Secondary | ICD-10-CM | POA: Diagnosis not present

## 2015-07-08 DIAGNOSIS — H11423 Conjunctival edema, bilateral: Secondary | ICD-10-CM | POA: Diagnosis not present

## 2015-07-08 DIAGNOSIS — H40013 Open angle with borderline findings, low risk, bilateral: Secondary | ICD-10-CM | POA: Diagnosis not present

## 2015-07-08 DIAGNOSIS — H52223 Regular astigmatism, bilateral: Secondary | ICD-10-CM | POA: Diagnosis not present

## 2015-07-13 ENCOUNTER — Telehealth: Payer: Self-pay

## 2015-07-13 NOTE — Telephone Encounter (Signed)
Patient is on the list for [employer] 2017 and may be a good candidate for an AWV in 2017. Please let me know if/when appt is scheduled.   

## 2015-07-22 DIAGNOSIS — G4733 Obstructive sleep apnea (adult) (pediatric): Secondary | ICD-10-CM | POA: Diagnosis not present

## 2015-07-25 NOTE — Telephone Encounter (Signed)
LM for pt to schedule AWV with RN after 11/12/15 and/or reschedule CPE that was cancelled in February with PCP

## 2015-07-30 DIAGNOSIS — J45909 Unspecified asthma, uncomplicated: Secondary | ICD-10-CM | POA: Diagnosis not present

## 2015-07-31 NOTE — Telephone Encounter (Signed)
Message routed to [facility], [employer].

## 2015-08-01 NOTE — Telephone Encounter (Signed)
Tiffany, could you reach out to this pt again and attempt to schedule her for AWV? Thanks.

## 2015-08-02 NOTE — Telephone Encounter (Signed)
Patient has a scheduled physical appointment for 09/05/2015 with PCP patient states will schedule AWV at that time.

## 2015-08-02 NOTE — Telephone Encounter (Signed)
Noted, thank you

## 2015-08-12 DIAGNOSIS — H40003 Preglaucoma, unspecified, bilateral: Secondary | ICD-10-CM | POA: Diagnosis not present

## 2015-08-12 DIAGNOSIS — H11423 Conjunctival edema, bilateral: Secondary | ICD-10-CM | POA: Diagnosis not present

## 2015-08-12 DIAGNOSIS — H25013 Cortical age-related cataract, bilateral: Secondary | ICD-10-CM | POA: Diagnosis not present

## 2015-08-12 DIAGNOSIS — H40033 Anatomical narrow angle, bilateral: Secondary | ICD-10-CM | POA: Diagnosis not present

## 2015-08-12 DIAGNOSIS — H11153 Pinguecula, bilateral: Secondary | ICD-10-CM | POA: Diagnosis not present

## 2015-08-12 DIAGNOSIS — H40029 Open angle with borderline findings, high risk, unspecified eye: Secondary | ICD-10-CM | POA: Diagnosis not present

## 2015-08-12 DIAGNOSIS — H2513 Age-related nuclear cataract, bilateral: Secondary | ICD-10-CM | POA: Diagnosis not present

## 2015-08-12 DIAGNOSIS — H18413 Arcus senilis, bilateral: Secondary | ICD-10-CM | POA: Diagnosis not present

## 2015-08-15 DIAGNOSIS — K219 Gastro-esophageal reflux disease without esophagitis: Secondary | ICD-10-CM | POA: Diagnosis not present

## 2015-08-15 DIAGNOSIS — R748 Abnormal levels of other serum enzymes: Secondary | ICD-10-CM | POA: Diagnosis not present

## 2015-08-15 DIAGNOSIS — K5900 Constipation, unspecified: Secondary | ICD-10-CM | POA: Diagnosis not present

## 2015-08-23 ENCOUNTER — Ambulatory Visit (INDEPENDENT_AMBULATORY_CARE_PROVIDER_SITE_OTHER): Payer: Medicare HMO | Admitting: Pulmonary Disease

## 2015-08-23 ENCOUNTER — Encounter: Payer: Self-pay | Admitting: Pulmonary Disease

## 2015-08-23 VITALS — BP 118/88 | HR 70 | Ht 63.0 in | Wt 241.0 lb

## 2015-08-23 DIAGNOSIS — J45901 Unspecified asthma with (acute) exacerbation: Secondary | ICD-10-CM

## 2015-08-23 DIAGNOSIS — J209 Acute bronchitis, unspecified: Secondary | ICD-10-CM

## 2015-08-23 DIAGNOSIS — G47419 Narcolepsy without cataplexy: Secondary | ICD-10-CM | POA: Diagnosis not present

## 2015-08-23 DIAGNOSIS — J4530 Mild persistent asthma, uncomplicated: Secondary | ICD-10-CM

## 2015-08-23 DIAGNOSIS — J4521 Mild intermittent asthma with (acute) exacerbation: Secondary | ICD-10-CM

## 2015-08-23 DIAGNOSIS — Z6841 Body Mass Index (BMI) 40.0 and over, adult: Secondary | ICD-10-CM

## 2015-08-23 DIAGNOSIS — E662 Morbid (severe) obesity with alveolar hypoventilation: Secondary | ICD-10-CM

## 2015-08-23 DIAGNOSIS — G4733 Obstructive sleep apnea (adult) (pediatric): Secondary | ICD-10-CM | POA: Diagnosis not present

## 2015-08-23 DIAGNOSIS — Z9989 Dependence on other enabling machines and devices: Secondary | ICD-10-CM

## 2015-08-23 DIAGNOSIS — J9611 Chronic respiratory failure with hypoxia: Secondary | ICD-10-CM

## 2015-08-23 MED ORDER — ALBUTEROL SULFATE HFA 108 (90 BASE) MCG/ACT IN AERS: 2.0000 | INHALATION_SPRAY | Freq: Four times a day (QID) | RESPIRATORY_TRACT | Status: DC | PRN

## 2015-08-23 MED ORDER — BECLOMETHASONE DIPROPIONATE 40 MCG/ACT IN AERS: 2.0000 | INHALATION_SPRAY | Freq: Two times a day (BID) | RESPIRATORY_TRACT | Status: DC

## 2015-08-23 NOTE — Progress Notes (Signed)
Current Outpatient Prescriptions on File Prior to Visit  Medication Sig  . albuterol (PROVENTIL) (2.5 MG/3ML) 0.083% nebulizer solution INHALE 1 VIAL BY NEBULIZER AS DIRECTED  . gabapentin (NEURONTIN) 300 MG capsule Take 1 capsule (300 mg total) by mouth every evening.  . ranitidine (ZANTAC) 150 MG tablet TAKE 1 TABLET(150 MG) BY MOUTH TWICE DAILY  . valACYclovir (VALTREX) 1000 MG tablet Take 1 tablet (1,000 mg total) by mouth 3 (three) times daily.   No current facility-administered medications on file prior to visit.    Chief Complaint  Patient presents with  . Follow-up    Wears CPAP nightly. Denies any issues with mask/pressure. Currently on 2 liters O2 with CPAP.  DME: [employer](cpap) [employer] (O2)    Pulmonary tests PFT 12/18/11>>FEV1 2.10 (89%), FEV1% 82, TLC 3.86 (81%), DLCO 63%, +BD from FEF 25-75%  CT chest 10/02/11>>minimal reticular nodular opacity Rt lung base, distal esophageal thickening  CT chest 09/19/12 >> clearing of RLL GGO  Sleep tests PSG 02/10/12 >> AHI 87.7, SpO2 low 75%. Auto CPAP 03/23/12 to 04/04/12 >> Used on 12 of 13 nights with average 7 hrs 4 min. Average AHI 6.5 with mean CPAP 9 cm H2O and 90th percentile CPAP 12 cm H2O  Past medical history GERD, Hep C, DVT 2014  Past surgical history, Family history, Social history, Allergies reviewed.  Vital signs BP 118/88 mmHg  Pulse 70  Ht 5\' 3"  (1.6 m)  Wt 241 lb (109.317 kg)  BMI 42.70 kg/m2  SpO2 98%  History of Present Illness: Molly Klein is a 55 y.o. female former smoker with asthma.  She also has hx of OSA and narcolepsy.  She has noticed some trouble with her breathing in hot weather.  Otherwise breathing has been okay.  Not needing albuterol.  Needs refill for Qvar.  She is not having cough, wheeze, sputum, or chest discomfort.  Sinuses okay.  She uses CPAP and oxygen nightly.  Goes to bed at 930 pm.  Her husband watches TV at night, and this wakes her up.  She then gets out of bed 5 am with her  husband before he goes to work.  She can stay awake during the day, but then gets sleepy in late evening.  She has nasal pillows mask.  No issues with mask fit.   Physical Exam:  General - No distress ENT - No sinus tenderness, clear nasal discharge, no oral exudate, no LAN, MP 4, enlarged tongue, s/p UPPP Cardiac - s1s2 regular, no murmur Chest - No wheeze/rales/dullness Back - No focal tenderness Abd - Soft, non-tender Ext - No edema Neuro - Normal strength Skin - No rashes Psych - normal mood, and behavior  Assessment/Plan:  Obstructive sleep apnea. - She reports compliance with CPAP and benefit from therapy. 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 - doesn't seem to be much of an issue at present - defer stimulant medication therapy at this time - discussed maintaining regular sleep/wake pattern - safe driving practices reviewed  Asthma. - refill Qvar - continue prn albuterol  Obesity. - discussed importance of weight loss   Patient Instructions  Follow up in 6 months     Chesley Mires, MD [facility] Pager:  [phone] 08/23/2015, 9:19 AM

## 2015-08-23 NOTE — Patient Instructions (Signed)
Follow up in 6 months 

## 2015-08-29 DIAGNOSIS — J45909 Unspecified asthma, uncomplicated: Secondary | ICD-10-CM | POA: Diagnosis not present

## 2015-09-02 ENCOUNTER — Ambulatory Visit (HOSPITAL_BASED_OUTPATIENT_CLINIC_OR_DEPARTMENT_OTHER): Payer: Medicare HMO

## 2015-09-02 ENCOUNTER — Ambulatory Visit (INDEPENDENT_AMBULATORY_CARE_PROVIDER_SITE_OTHER): Payer: Medicare HMO | Admitting: Family Medicine

## 2015-09-02 ENCOUNTER — Encounter: Payer: Self-pay | Admitting: Family Medicine

## 2015-09-02 ENCOUNTER — Ambulatory Visit (HOSPITAL_BASED_OUTPATIENT_CLINIC_OR_DEPARTMENT_OTHER)
Admission: RE | Admit: 2015-09-02 | Discharge: 2015-09-02 | Disposition: A | Discharge status: Home / Self Care | Payer: Medicare HMO | Source: Ambulatory Visit | Attending: Family Medicine | Admitting: Family Medicine

## 2015-09-02 VITALS — BP 136/99 | HR 78 | Temp 97.8°F | Resp 17 | Ht 63.0 in | Wt 242.8 lb

## 2015-09-02 DIAGNOSIS — Z23 Encounter for immunization: Secondary | ICD-10-CM | POA: Diagnosis not present

## 2015-09-02 DIAGNOSIS — M79672 Pain in left foot: Secondary | ICD-10-CM

## 2015-09-02 DIAGNOSIS — M25562 Pain in left knee: Secondary | ICD-10-CM

## 2015-09-02 DIAGNOSIS — M79662 Pain in left lower leg: Secondary | ICD-10-CM

## 2015-09-02 DIAGNOSIS — M7989 Other specified soft tissue disorders: Secondary | ICD-10-CM | POA: Diagnosis not present

## 2015-09-02 MED ORDER — TETANUS-DIPHTH-ACELL PERTUSSIS 5-2.5-18.5 LF-MCG/0.5 IM SUSP: 0.5000 mL | Freq: Once | INTRAMUSCULAR | 0 refills | Status: AC

## 2015-09-02 NOTE — Patient Instructions (Signed)
Baker Cyst °A Baker cyst is a sac-like structure that forms in the back of the knee. It is filled with the same fluid that is located in your knee. This fluid lubricates the bones and cartilage of the knee and allows them to move over each other more easily. °CAUSES  °When the knee becomes injured or inflamed, increased fluid forms in the knee. When this happens, the joint lining is pushed out behind the knee and forms the Baker cyst. This cyst may also be caused by inflammation from arthritic conditions and infections. °SIGNS AND SYMPTOMS  °A Baker cyst usually has no symptoms. When the cyst is substantially enlarged: °· You may feel pressure behind the knee, stiffness in the knee, or a mass in the area behind the knee. °· You may develop pain, redness, and swelling in the calf.  This can suggest a blood clot and requires evaluation by your health care provider. °DIAGNOSIS  °A Baker cyst is most often found during an ultrasound exam. This exam may have been performed for other reasons, and the cyst was found incidentally. Sometimes an MRI is used. This picks up other problems within a joint that an ultrasound exam may not. If the Baker cyst developed immediately after an injury, X-ray exams may be used to diagnose the cyst. °TREATMENT  °The treatment depends on the cause of the cyst. Anti-inflammatory medicines and rest often will be prescribed. If the cyst is caused by a bacterial infection, antibiotic medicines may be prescribed.  °HOME CARE INSTRUCTIONS  °· If the cyst was caused by an injury, for the first 24 hours, keep the injured leg elevated on 2 pillows while lying down. °· For the first 24 hours while you are awake, apply ice to the injured area: °¨ Put ice in a plastic bag. °¨ Place a towel between your skin and the bag. °¨ Leave the ice on for 20 minutes, 2-3 times a day. °· Only take over-the-counter or prescription medicines for pain, discomfort, or fever as directed by your health care  provider. °· Only take antibiotic medicine as directed. Make sure to finish it even if you start to feel better. °MAKE SURE YOU:  °· Understand these instructions. °· Will watch your condition. °· Will get help right away if you are not doing well or get worse. °  °This information is not intended to replace advice given to you by your health care provider. Make sure you discuss any questions you have with your health care provider. °  °Document Released: 01/19/2005 Document Revised: 11/09/2012 Document Reviewed: 08/31/2012 °Elsevier Interactive Patient Education ©2016 [employer] ° °

## 2015-09-02 NOTE — Addendum Note (Signed)
Addended by: Ewing Schlein on: 09/02/2015 11:09 AM   Modules accepted: Orders

## 2015-09-02 NOTE — Progress Notes (Signed)
Patient ID: Molly Klein, female    DOB: 24-Oct-1960  Age: 55 y.o. MRN: [id number]    Subjective:  Subjective  HPI TYRESA VOORHEES presents for c/o L foot  "knot" that has gone down but foot is numb still she is also c/o L knee pain --- she had surgery in January but still has pain behind L knee.  She was told there was a bakers cyst but it was small.  She feels it has gotten bigger.    Review of Systems  Constitutional: Negative for appetite change, diaphoresis, fatigue and unexpected weight change.  Eyes: Negative for pain, redness and visual disturbance.  Respiratory: Negative for cough, chest tightness, shortness of breath and wheezing.   Cardiovascular: Negative for chest pain, palpitations and leg swelling.  Endocrine: Negative for cold intolerance, heat intolerance, polydipsia, polyphagia and polyuria.  Genitourinary: Negative for difficulty urinating, dysuria and frequency.  Musculoskeletal: Positive for gait problem and joint swelling.  Neurological: Negative for dizziness, light-headedness, numbness and headaches.    History Past Medical History:  Diagnosis Date  . Asthma   . Bruising 10/21/2012  . DVT (deep venous thrombosis) ([place])   . GERD (gastroesophageal reflux disease)   . Hepatitis C   . History of DVT of lower extremity 10/21/2012  . Knee pain    left   . Neuromuscular disorder (HCC)    neuropathy lt arm  . Nipple discharge    right breast  . OSA (obstructive sleep apnea)     She has a past surgical history that includes Cholecystectomy; Pilonidal cyst excision; Dilation and curettage of uterus; Tonsillectomy; Abdominal hysterectomy; Fracture surgery; Breast biopsy (02/06/2011); right knee surgery; and Nipple repair.   Her family history includes Alcohol abuse in her mother; Cancer in her maternal grandmother and sister; Heart attack in her father and mother; Rectal cancer in her maternal grandmother.She reports that she quit smoking about 40 years ago. Her  smoking use included Cigarettes. She quit after 5.00 years of use. She has never used smokeless tobacco. She reports that she does not drink alcohol or use drugs.  Current Outpatient Prescriptions on File Prior to Visit  Medication Sig Dispense Refill  . albuterol (PROVENTIL HFA;VENTOLIN HFA) 108 (90 Base) MCG/ACT inhaler Inhale 2 puffs into the lungs 4 (four) times daily as needed. Shortness of breath 1 Inhaler 6  . albuterol (PROVENTIL) (2.5 MG/3ML) 0.083% nebulizer solution INHALE 1 VIAL BY NEBULIZER AS DIRECTED 75 mL 0  . beclomethasone (QVAR) 40 MCG/ACT inhaler Inhale 2 puffs into the lungs 2 (two) times daily. 1 Inhaler 12  . gabapentin (NEURONTIN) 300 MG capsule Take 1 capsule (300 mg total) by mouth every evening. 90 capsule 0  . ranitidine (ZANTAC) 150 MG tablet TAKE 1 TABLET(150 MG) BY MOUTH TWICE DAILY 180 tablet 3  . valACYclovir (VALTREX) 1000 MG tablet Take 1 tablet (1,000 mg total) by mouth 3 (three) times daily. 30 tablet 0   No current facility-administered medications on file prior to visit.      Objective:  Objective  Physical Exam  Constitutional: She is oriented to person, place, and time. She appears well-developed and well-nourished.  HENT:  Head: Normocephalic and atraumatic.  Eyes: Conjunctivae and EOM are normal.  Neck: Normal range of motion. Neck supple. No JVD present. Carotid bruit is not present. No thyromegaly present.  Cardiovascular: Normal rate, regular rhythm and normal heart sounds.   No murmur heard. Pulmonary/Chest: Effort normal and breath sounds normal. No respiratory distress. She has no  wheezes. She has no rales. She exhibits no tenderness.  Musculoskeletal: She exhibits edema and tenderness.       Left lower leg: She exhibits tenderness and swelling.       Legs: Neurological: She is alert and oriented to person, place, and time.  Psychiatric: She has a normal mood and affect.  Nursing note and vitals reviewed.  BP (!) 136/99 (BP Location:  Left Arm, Patient Position: Sitting)   Pulse 78   Temp 97.8 F (36.6 C) (Oral)   Resp 17   Ht 5\' 3"  (1.6 m)   Wt 242 lb 12.8 oz (110.1 kg)   SpO2 99%   BMI 43.01 kg/m  Wt Readings from Last 3 Encounters:  09/02/15 242 lb 12.8 oz (110.1 kg)  08/23/15 241 lb (109.3 kg)  04/16/15 244 lb 12.8 oz (111 kg)     Lab Results  Component Value Date   WBC 4.5 03/12/2014   HGB 15.0 03/12/2014   HCT 43.4 03/12/2014   PLT 206.0 03/12/2014   GLUCOSE 97 03/12/2014   CHOL 173 03/12/2014   TRIG 90.0 03/12/2014   HDL 50.00 03/12/2014   LDLCALC 105 (H) 03/12/2014   ALT 18 03/12/2014   AST 14 03/12/2014   NA 138 03/12/2014   K 3.7 03/12/2014   CL 102 03/12/2014   CREATININE 0.63 03/12/2014   BUN 14 03/12/2014   CO2 28 03/12/2014   TSH 2.33 03/12/2014   INR 1.04 02/11/2010   HGBA1C 5.5 07/07/2011    Mr Knee Left  Wo Contrast  Result Date: 11/01/2014 CLINICAL DATA:  Severe left knee pain for 2 and half months. The knee is tender medially. EXAM: MRI OF THE LEFT KNEE WITHOUT CONTRAST TECHNIQUE: Multiplanar, multisequence MR imaging of the knee was performed. No intravenous contrast was administered. COMPARISON:  09/15/2006 FINDINGS: Despite efforts by the technologist and patient, motion artifact is present on today's exam and could not be eliminated. This reduces exam sensitivity and specificity. MENISCI Medial meniscus: Grade 3 oblique tear of the posterior horn and midbody medial meniscus. Small radial component in the posterior horn is suggested on images 20-21 series 8. The tear primarily involves the inferior meniscal surface. Lateral meniscus:  Unremarkable LIGAMENTS Cruciates:  Unremarkable Collaterals: Mild edema tracks adjacent to the MCL. This can be incidental but in the appropriate clinical circumstance could represent grade 1 sprain. CARTILAGE Patellofemoral:  Unremarkable Medial:  Mild but smooth degenerative chondral thinning. Lateral:  Mild degenerative chondral thinning. Joint:   Trace knee effusion. Popliteal Fossa:  Very small Baker's cyst. Extensor Mechanism: Subtle linear increased signal in the distal quadriceps tendon is likely incidental but in the appropriate clinical setting could represent a quadriceps sprain. Bones:  Unremarkable IMPRESSION: 1. Grade 3 oblique tear of the posterior horn and midbody medial meniscus, inferior surface involvement. Small radial component in the posterior horn. 2. Mild edema tracks adjacent to the MCL. This can be incidental but in the appropriate clinical circumstance could represent grade 1 sprain. 3. Subtle linear increased signal in the distal quadriceps tendon is likely incidental but in the appropriate clinical setting could represent a quadriceps sprain. 4. Mild but smooth degenerative chondral thinning in the medial and lateral compartments. 5. Trace knee effusion with very small Baker's cyst. Electronically Signed   By: Van Clines M.D.   On: 11/01/2014 14:24     Assessment & Plan:  Plan  I am having Ms. Littell start on Tdap. I am also having her maintain her gabapentin, albuterol, valACYclovir,  ranitidine, albuterol, and beclomethasone.  Meds ordered this encounter  Medications  . Tdap (BOOSTRIX) 5-2.5-18.5 LF-MCG/0.5 injection    Sig: Inject 0.5 mLs into the muscle once.    Dispense:  0.5 mL    Refill:  0    Problem List Items Addressed This Visit      Unprioritized   Left knee pain    F/u ortho Check [place] low ext-- ? Bakers cyst enlarging -- r/o dvt secondary to calf pain       Other Visit Diagnoses    Need for prophylactic vaccination with combined diphtheria-tetanus-pertussis (DTP) vaccine    -  Primary   Relevant Medications   Tdap (BOOSTRIX) 5-2.5-18.5 LF-MCG/0.5 injection   Calf pain, left       Relevant Orders   [place] Extrem Low Left Comp   Left foot pain       Relevant Orders   Ambulatory referral to Orthopedic Surgery      Follow-up: Return if symptoms worsen or fail to improve.  [name], DO

## 2015-09-02 NOTE — Assessment & Plan Note (Signed)
F/u ortho Check [place] low ext-- ? Bakers cyst enlarging -- r/o dvt secondary to calf pain

## 2015-09-02 NOTE — Progress Notes (Signed)
Pre visit review using our clinic review tool, if applicable. No additional management support is needed unless otherwise documented below in the visit note. 

## 2015-09-04 DIAGNOSIS — H40033 Anatomical narrow angle, bilateral: Secondary | ICD-10-CM | POA: Diagnosis not present

## 2015-09-04 DIAGNOSIS — H2513 Age-related nuclear cataract, bilateral: Secondary | ICD-10-CM | POA: Diagnosis not present

## 2015-09-05 ENCOUNTER — Other Ambulatory Visit: Payer: Self-pay | Admitting: Medical

## 2015-09-05 ENCOUNTER — Encounter: Payer: Self-pay | Admitting: Family Medicine

## 2015-09-05 ENCOUNTER — Ambulatory Visit (INDEPENDENT_AMBULATORY_CARE_PROVIDER_SITE_OTHER): Payer: Medicare HMO | Admitting: Family Medicine

## 2015-09-05 VITALS — BP 138/90 | HR 70 | Temp 97.7°F | Ht 63.0 in | Wt 242.2 lb

## 2015-09-05 DIAGNOSIS — Z0000 Encounter for general adult medical examination without abnormal findings: Secondary | ICD-10-CM | POA: Diagnosis not present

## 2015-09-05 DIAGNOSIS — H6092 Unspecified otitis externa, left ear: Secondary | ICD-10-CM | POA: Diagnosis not present

## 2015-09-05 LAB — MICROALBUMIN / CREATININE URINE RATIO
Creatinine,U: 226.6 mg/dL
MICROALB UR: 1.1 mg/dL (ref 0.0–1.9)
Microalb Creat Ratio: 0.5 mg/g (ref 0.0–30.0)

## 2015-09-05 LAB — POCT URINALYSIS DIPSTICK
Bilirubin, UA: NEGATIVE
Blood, UA: NEGATIVE
GLUCOSE UA: NEGATIVE
KETONES UA: NEGATIVE
Leukocytes, UA: NEGATIVE
Nitrite, UA: NEGATIVE
Protein, UA: 0.15
Urobilinogen, UA: 0.2
pH, UA: 6

## 2015-09-05 LAB — COMPREHENSIVE METABOLIC PANEL
ALBUMIN: 4.3 g/dL (ref 3.5–5.2)
ALT: 24 U/L (ref 0–35)
AST: 18 U/L (ref 0–37)
Alkaline Phosphatase: 69 U/L (ref 39–117)
BILIRUBIN TOTAL: 0.5 mg/dL (ref 0.2–1.2)
BUN: 15 mg/dL (ref 6–23)
CALCIUM: 9.6 mg/dL (ref 8.4–10.5)
CHLORIDE: 104 meq/L (ref 96–112)
CO2: 30 mEq/L (ref 19–32)
CREATININE: 0.72 mg/dL (ref 0.40–1.20)
GFR: 107.26 mL/min (ref >60.00)
Glucose, Bld: 93 mg/dL (ref 70–99)
Potassium: 4.4 mEq/L (ref 3.5–5.1)
Sodium: 140 mEq/L (ref 135–145)
Total Protein: 7.8 g/dL (ref 6.0–8.3)

## 2015-09-05 LAB — CBC WITH DIFFERENTIAL/PLATELET
BASOS ABS: 0 10*3/uL (ref 0.0–0.1)
Basophils Relative: 0.7 % (ref 0.0–3.0)
EOS ABS: 0.2 10*3/uL (ref 0.0–0.7)
Eosinophils Relative: 4.2 % (ref 0.0–5.0)
HEMATOCRIT: 43.7 % (ref 36.0–46.0)
HEMOGLOBIN: 14.8 g/dL (ref 12.0–15.0)
LYMPHS PCT: 33.7 % (ref 12.0–46.0)
Lymphs Abs: 1.4 10*3/uL (ref 0.7–4.0)
MCHC: 33.8 g/dL (ref 30.0–36.0)
MCV: 88.2 fl (ref 78.0–100.0)
Monocytes Absolute: 0.3 10*3/uL (ref 0.1–1.0)
Monocytes Relative: 6.2 % (ref 3.0–12.0)
Neutro Abs: 2.3 10*3/uL (ref 1.4–7.7)
Neutrophils Relative %: 55.2 % (ref 43.0–77.0)
Platelets: 210 10*3/uL (ref 150.0–400.0)
RBC: 4.95 Mil/uL (ref 3.87–5.11)
RDW: 14.6 % (ref 11.5–15.5)
WBC: 4.2 10*3/uL (ref 4.0–10.5)

## 2015-09-05 LAB — LIPID PANEL
CHOL/HDL RATIO: 3
Cholesterol: 170 mg/dL (ref 0–200)
HDL: 54.4 mg/dL (ref >39.00)
LDL CALC: 103 mg/dL — AB (ref 0–99)
NonHDL: 115.3
TRIGLYCERIDES: 60 mg/dL (ref 0.0–149.0)
VLDL: 12 mg/dL (ref 0.0–40.0)

## 2015-09-05 LAB — TSH: TSH: 1.65 u[IU]/mL (ref 0.35–4.50)

## 2015-09-05 MED ORDER — OFLOXACIN 0.3 % OT SOLN: 10.0000 [drp] | Freq: Every day | OTIC | 0 refills | Status: DC

## 2015-09-05 NOTE — Progress Notes (Signed)
Subjective:   Molly Klein is a 55 y.o. female who presents for Medicare Annual (Subsequent) preventive examination.  Review of Systems:   Review of Systems  Constitutional: Negative for activity change, appetite change and fatigue.  HENT: Negative for hearing loss, congestion, tinnitus and ear discharge.   Eyes: Negative for visual disturbance (see optho q1y -- vision corrected to 20/20 with glasses).  Respiratory: Negative for cough, chest tightness and shortness of breath.   Cardiovascular: Negative for chest pain, palpitations and leg swelling.  Gastrointestinal: Negative for abdominal pain, diarrhea, constipation and abdominal distention.  Genitourinary: Negative for urgency, frequency, decreased urine volume and difficulty urinating.  Musculoskeletal: Negative for back pain, arthralgias and gait problem.  Skin: Negative for color change, pallor and rash.  Neurological: Negative for dizziness, light-headedness, numbness and headaches.  Hematological: Negative for adenopathy. Does not bruise/bleed easily.  Psychiatric/Behavioral: Negative for suicidal ideas, confusion, sleep disturbance, self-injury, dysphoric mood, decreased concentration and agitation.  Pt is able to read and write and can do all ADLs No risk for falling No abuse/ violence in home          Objective:     Vitals: BP 138/90 (BP Location: Left Arm, Patient Position: Sitting, Cuff Size: Large)   Pulse 70   Temp 97.7 F (36.5 C) (Oral)   Ht 5\' 3"  (1.6 m)   Wt 242 lb 3.2 oz (109.9 kg)   SpO2 97%   BMI 42.90 kg/m   Body mass index is 42.9 kg/m. BP 138/90 (BP Location: Left Arm, Patient Position: Sitting, Cuff Size: Large)   Pulse 70   Temp 97.7 F (36.5 C) (Oral)   Ht 5\' 3"  (1.6 m)   Wt 242 lb 3.2 oz (109.9 kg)   SpO2 97%   BMI 42.90 kg/m  General appearance: alert, cooperative, appears stated age and no distress Head: Normocephalic, without obvious abnormality, atraumatic Eyes:  conjunctivae/corneas clear. PERRL, EOM's intact. Fundi benign. Ears: normal TM's and external ear canals both ears Nose: Nares normal. Septum midline. Mucosa normal. No drainage or sinus tenderness. Throat: lips, mucosa, and tongue normal; teeth and gums normal Neck: no adenopathy, no carotid bruit, no JVD, supple, symmetrical, trachea midline and thyroid not enlarged, symmetric, no tenderness/mass/nodules Back: symmetric, no curvature. ROM normal. No CVA tenderness. Lungs: clear to auscultation bilaterally Breasts: normal appearance, no masses or tenderness Heart: regular rate and rhythm, S1, S2 normal, no murmur, click, rub or gallop Abdomen: soft, non-tender; bowel sounds normal; no masses,  no organomegaly Pelvic: deferred Extremities: extremities normal, atraumatic, no cyanosis or edema Pulses: 2+ and symmetric Skin: Skin color, texture, turgor normal. No rashes or lesions Lymph nodes: Cervical, supraclavicular, and axillary nodes normal. Neurologic: Alert and oriented X 3, normal strength and tone. Normal symmetric reflexes. Normal coordination and gait  Tobacco History  Smoking Status  . Former Smoker  . Years: 5.00  . Types: Cigarettes  . Quit date: 02/03/1975  Smokeless Tobacco  . Never Used    Comment: 1 pack per week     Counseling given: Not Answered   Past Medical History:  Diagnosis Date  . Asthma   . Bruising 10/21/2012  . DVT (deep venous thrombosis) ([place])   . GERD (gastroesophageal reflux disease)   . Hepatitis C   . History of DVT of lower extremity 10/21/2012  . Knee pain    left   . Neuromuscular disorder (HCC)    neuropathy lt arm  . Nipple discharge    right breast  .  OSA (obstructive sleep apnea)    Past Surgical History:  Procedure Laterality Date  . ABDOMINAL HYSTERECTOMY    . BREAST BIOPSY  02/06/2011   Procedure: BREAST BIOPSY;  Surgeon: Earnstine Regal, MD;  Location: [facility];  Service: General;  Laterality: Right;  right breast  biopsy  . CHOLECYSTECTOMY    . DILATION AND CURETTAGE OF UTERUS    . FRACTURE SURGERY     fx lt arm  . Nipple repair    . PILONIDAL CYST EXCISION    . right knee surgery    . TONSILLECTOMY     Family History  Problem Relation Age of Onset  . Rectal cancer Maternal Grandmother   . Cancer Sister     pt unaware of what kind  . Cancer Maternal Grandmother     breast  . Heart attack Mother     mi in 66s  . Alcohol abuse Mother     cirrhosis of liver  . Heart attack Father     MI in 6s   History  Sexual Activity  . Sexual activity: Yes  . Partners: Male    Outpatient Encounter Prescriptions as of 09/05/2015  Medication Sig  . albuterol (PROVENTIL HFA;VENTOLIN HFA) 108 (90 Base) MCG/ACT inhaler Inhale 2 puffs into the lungs 4 (four) times daily as needed. Shortness of breath  . albuterol (PROVENTIL) (2.5 MG/3ML) 0.083% nebulizer solution INHALE 1 VIAL BY NEBULIZER AS DIRECTED  . beclomethasone (QVAR) 40 MCG/ACT inhaler Inhale 2 puffs into the lungs 2 (two) times daily.  Marland Kitchen BESIVANCE 0.6 % SUSP   . DUREZOL 0.05 % EMUL   . gabapentin (NEURONTIN) 300 MG capsule Take 1 capsule (300 mg total) by mouth every evening.  Marland Kitchen PROLENSA 0.07 % SOLN   . ranitidine (ZANTAC) 150 MG tablet TAKE 1 TABLET(150 MG) BY MOUTH TWICE DAILY  . valACYclovir (VALTREX) 1000 MG tablet Take 1 tablet (1,000 mg total) by mouth 3 (three) times daily.  Marland Kitchen ofloxacin (FLOXIN OTIC) 0.3 % otic solution Place 10 drops into the left ear daily.   No facility-administered encounter medications on file as of 09/05/2015.     Activities of Daily Living In your present state of health, do you have any difficulty performing the following activities: 09/02/2015 11/12/2014  Hearing? N N  Vision? Y N  Difficulty concentrating or making decisions? N N  Walking or climbing stairs? Y Y  Dressing or bathing? N N  Doing errands, shopping? N N  Preparing Food and eating ? - N  Using the Toilet? - N  In the past six months, have  you accidently leaked urine? - N  Do you have problems with loss of bowel control? - N  Managing your Medications? - N  Managing your Finances? - N  Housekeeping or managing your Housekeeping? - N  Some recent data might be hidden    Patient Care Team: [name], DO as PCP - General Calton Dach, MD as Referring Physician (Optometry) Chesley Mires, MD as Consulting Physician (Pulmonary Disease) Juanita Craver, MD as Consulting Physician (Gastroenterology) Warden Fillers, MD as Consulting Physician (Ophthalmology)    Assessment:    cpe Exercise Activities and Dietary recommendations Current Exercise Habits: The patient does not participate in regular exercise at present, Exercise limited by: None identified  Goals    . Weight < 200 lb (90.719 kg)      Fall Risk Fall Risk  09/05/2015 11/12/2014  Falls in the past year? No  No   Depression Screen PHQ 2/9 Scores 09/05/2015 09/02/2015 11/12/2014  PHQ - 2 Score 0 0 0     Cognitive Testing MMSE - Mini Mental State Exam 11/12/2014  Not completed: (No Data)    Immunization History  Administered Date(s) Administered  . Influenza Split 10/22/2011  . Influenza,inj,Quad PF,36+ Mos 11/02/2013  . Td 10/01/2003   Screening Tests Health Maintenance  Topic Date Due  . TETANUS/TDAP  09/30/2013  . INFLUENZA VACCINE  11/05/2015 (Originally 09/03/2015)  . MAMMOGRAM  01/06/2016  . PAP SMEAR  03/12/2017  . COLONOSCOPY  03/13/2019  . Hepatitis C Screening  Completed  . HIV Screening  Completed      Plan:    see AVS During the course of the visit the patient was educated and counseled about the following appropriate screening and preventive services:   Vaccines to include Pneumoccal, Influenza, Hepatitis B, Td, Zostavax, HCV  Electrocardiogram  Cardiovascular Disease  Colorectal cancer screening  Bone density screening  Diabetes screening  Glaucoma screening  Mammography/PAP  Nutrition counseling   Patient  Instructions (the written plan) was given to the patient.  1. Preventative health care See above - Comprehensive metabolic panel - CBC with Differential/Platelet - Lipid panel - TSH - POCT urinalysis dipstick - Microalbumin / creatinine urine ratio  [name], DO  09/05/2015

## 2015-09-05 NOTE — Progress Notes (Signed)
Subjective:     Molly Klein is a 55 y.o. female and is here for a comprehensive physical exam. The patient reports no problems.  Social History   Social History  . Marital status: Married    Spouse name: N/A  . Number of children: N/A  . Years of education: N/A   Occupational History  . retired Disabled   Social History Main Topics  . Smoking status: Former Smoker    Years: 5.00    Types: Cigarettes    Quit date: 02/03/1975  . Smokeless tobacco: Never Used     Comment: 1 pack per week  . Alcohol use No  . Drug use: No  . Sexual activity: Yes    Partners: Male   Other Topics Concern  . Not on file   Social History Narrative   Exercise-- 2-3 days a week   Health Maintenance  Topic Date Due  . TETANUS/TDAP  09/30/2013  . INFLUENZA VACCINE  11/05/2015 (Originally 09/03/2015)  . MAMMOGRAM  01/06/2016  . PAP SMEAR  03/12/2017  . COLONOSCOPY  03/13/2019  . Hepatitis C Screening  Completed  . HIV Screening  Completed    The following portions of the patient's history were reviewed and updated as appropriate: She  has a past medical history of Asthma; Bruising (10/21/2012); DVT (deep venous thrombosis) ([place]); GERD (gastroesophageal reflux disease); Hepatitis C; History of DVT of lower extremity (10/21/2012); Knee pain; Neuromuscular disorder ([place]); Nipple discharge; and OSA (obstructive sleep apnea). She  does not have any pertinent problems on file. She  has a past surgical history that includes Cholecystectomy; Pilonidal cyst excision; Dilation and curettage of uterus; Tonsillectomy; Abdominal hysterectomy; Fracture surgery; Breast biopsy (02/06/2011); right knee surgery; and Nipple repair. Her family history includes Alcohol abuse in her mother; Cancer in her maternal grandmother and sister; Heart attack in her father and mother; Rectal cancer in her maternal grandmother. She  reports that she quit smoking about 40 years ago. Her smoking use included Cigarettes. She quit  after 5.00 years of use. She has never used smokeless tobacco. She reports that she does not drink alcohol or use drugs. She has a current medication list which includes the following prescription(s): albuterol, albuterol, beclomethasone, besivance, durezol, gabapentin, prolensa, ranitidine, and valacyclovir. Current Outpatient Prescriptions on File Prior to Visit  Medication Sig Dispense Refill  . albuterol (PROVENTIL HFA;VENTOLIN HFA) 108 (90 Base) MCG/ACT inhaler Inhale 2 puffs into the lungs 4 (four) times daily as needed. Shortness of breath 1 Inhaler 6  . albuterol (PROVENTIL) (2.5 MG/3ML) 0.083% nebulizer solution INHALE 1 VIAL BY NEBULIZER AS DIRECTED 75 mL 0  . beclomethasone (QVAR) 40 MCG/ACT inhaler Inhale 2 puffs into the lungs 2 (two) times daily. 1 Inhaler 12  . gabapentin (NEURONTIN) 300 MG capsule Take 1 capsule (300 mg total) by mouth every evening. 90 capsule 0  . ranitidine (ZANTAC) 150 MG tablet TAKE 1 TABLET(150 MG) BY MOUTH TWICE DAILY 180 tablet 3  . valACYclovir (VALTREX) 1000 MG tablet Take 1 tablet (1,000 mg total) by mouth 3 (three) times daily. 30 tablet 0   No current facility-administered medications on file prior to visit.    She is allergic to codeine; interferon beta-1a; and sulfonamide derivatives..  Review of Systems Review of Systems  Constitutional: Negative for activity change, appetite change and fatigue.  HENT: Negative for hearing loss, congestion, tinnitus and ear discharge.  dentist q55m Eyes: Negative for visual disturbance (see optho q1y -- vision corrected to 20/20 with glasses).  Respiratory: Negative for cough, chest tightness and shortness of breath.   Cardiovascular: Negative for chest pain, palpitations and leg swelling.  Gastrointestinal: Negative for abdominal pain, diarrhea, constipation and abdominal distention.  Genitourinary: Negative for urgency, frequency, decreased urine volume and difficulty urinating.  Musculoskeletal: Negative for  back pain, arthralgias and gait problem.  Skin: Negative for color change, pallor and rash.  Neurological: Negative for dizziness, light-headedness, numbness and headaches.  Hematological: Negative for adenopathy. Does not bruise/bleed easily.  Psychiatric/Behavioral: Negative for suicidal ideas, confusion, sleep disturbance, self-injury, dysphoric mood, decreased concentration and agitation.       Objective:    BP 138/90 (BP Location: Left Arm, Patient Position: Sitting, Cuff Size: Large)   Pulse 70   Temp 97.7 F (36.5 C) (Oral)   Ht 5\' 3"  (1.6 m)   Wt 242 lb 3.2 oz (109.9 kg)   SpO2 97%   BMI 42.90 kg/m  General appearance: alert, cooperative, appears stated age and no distress Head: Normocephalic, without obvious abnormality, atraumatic Eyes: conjunctivae/corneas clear. PERRL, EOM's intact. Fundi benign. Ears: normal TM's and external ear canals both ears Nose: Nares normal. Septum midline. Mucosa normal. No drainage or sinus tenderness. Throat: lips, mucosa, and tongue normal; teeth and gums normal Neck: no adenopathy, no carotid bruit, no JVD, supple, symmetrical, trachea midline and thyroid not enlarged, symmetric, no tenderness/mass/nodules Back: symmetric, no curvature. ROM normal. No CVA tenderness. Lungs: clear to auscultation bilaterally Breasts: normal appearance, no masses or tenderness Heart: regular rate and rhythm, S1, S2 normal, no murmur, click, rub or gallop Abdomen: soft, non-tender; bowel sounds normal; no masses,  no organomegaly Pelvic: deferred Extremities: extremities normal, atraumatic, no cyanosis or edema Pulses: 2+ and symmetric Skin: Skin color, texture, turgor normal. No rashes or lesions Lymph nodes: Cervical, supraclavicular, and axillary nodes normal. Neurologic: Alert and oriented X 3, normal strength and tone. Normal symmetric reflexes. Normal coordination and gait    Assessment:    Healthy female exam.      Plan:    ghm utd Check  labs See After Visit Summary for Counseling Recommendations    1. Preventative health care See above - Comprehensive metabolic panel - CBC with Differential/Platelet - Lipid panel - TSH - POCT urinalysis dipstick - Microalbumin / creatinine urine ratio

## 2015-09-05 NOTE — Patient Instructions (Signed)
Preventive Care for Adults, Female A healthy lifestyle and preventive care can promote health and wellness. Preventive health guidelines for women include the following key practices.  A routine yearly physical is a good way to check with your health care provider about your health and preventive screening. It is a chance to share any concerns and updates on your health and to receive a thorough exam.  Visit your dentist for a routine exam and preventive care every 6 months. Brush your teeth twice a day and floss once a day. Good oral hygiene prevents tooth decay and gum disease.  The frequency of eye exams is based on your age, health, family medical history, use of contact lenses, and other factors. Follow your health care provider's recommendations for frequency of eye exams.  Eat a healthy diet. Foods like vegetables, fruits, whole grains, low-fat dairy products, and lean protein foods contain the nutrients you need without too many calories. Decrease your intake of foods high in solid fats, added sugars, and salt. Eat the right amount of calories for you.Get information about a proper diet from your health care provider, if necessary.  Regular physical exercise is one of the most important things you can do for your health. Most adults should get at least 150 minutes of moderate-intensity exercise (any activity that increases your heart rate and causes you to sweat) each week. In addition, most adults need muscle-strengthening exercises on 2 or more days a week.  Maintain a healthy weight. The body mass index (BMI) is a screening tool to identify possible weight problems. It provides an estimate of body fat based on height and weight. Your health care provider can find your BMI and can help you achieve or maintain a healthy weight.For adults 20 years and older:  A BMI below 18.5 is considered underweight.  A BMI of 18.5 to 24.9 is normal.  A BMI of 25 to 29.9 is considered overweight.  A  BMI of 30 and above is considered obese.  Maintain normal blood lipids and cholesterol levels by exercising and minimizing your intake of saturated fat. Eat a balanced diet with plenty of fruit and vegetables. Blood tests for lipids and cholesterol should begin at age 45 and be repeated every 5 years. If your lipid or cholesterol levels are high, you are over 50, or you are at high risk for heart disease, you may need your cholesterol levels checked more frequently.Ongoing high lipid and cholesterol levels should be treated with medicines if diet and exercise are not working.  If you smoke, find out from your health care provider how to quit. If you do not use tobacco, do not start.  Lung cancer screening is recommended for adults aged 45-80 years who are at high risk for developing lung cancer because of a history of smoking. A yearly low-dose CT scan of the lungs is recommended for people who have at least a 30-pack-year history of smoking and are a current smoker or have quit within the past 15 years. A pack year of smoking is smoking an average of 1 pack of cigarettes a day for 1 year (for example: 1 pack a day for 30 years or 2 packs a day for 15 years). Yearly screening should continue until the smoker has stopped smoking for at least 15 years. Yearly screening should be stopped for people who develop a health problem that would prevent them from having lung cancer treatment.  If you are pregnant, do not drink alcohol. If you are  breastfeeding, be very cautious about drinking alcohol. If you are not pregnant and choose to drink alcohol, do not have more than 1 drink per day. One drink is considered to be 12 ounces (355 mL) of beer, 5 ounces (148 mL) of wine, or 1.5 ounces (44 mL) of liquor.  Avoid use of street drugs. Do not share needles with anyone. Ask for help if you need support or instructions about stopping the use of drugs.  High blood pressure causes heart disease and increases the risk  of stroke. Your blood pressure should be checked at least every 1 to 2 years. Ongoing high blood pressure should be treated with medicines if weight loss and exercise do not work.  If you are 55-79 years old, ask your health care provider if you should take aspirin to prevent strokes.  Diabetes screening is done by taking a blood sample to check your blood glucose level after you have not eaten for a certain period of time (fasting). If you are not overweight and you do not have risk factors for diabetes, you should be screened once every 3 years starting at age 45. If you are overweight or obese and you are 40-70 years of age, you should be screened for diabetes every year as part of your cardiovascular risk assessment.  Breast cancer screening is essential preventive care for women. You should practice "breast self-awareness." This means understanding the normal appearance and feel of your breasts and may include breast self-examination. Any changes detected, no matter how small, should be reported to a health care provider. Women in their 20s and 30s should have a clinical breast exam (CBE) by a health care provider as part of a regular health exam every 1 to 3 years. After age 40, women should have a CBE every year. Starting at age 40, women should consider having a mammogram (breast X-ray test) every year. Women who have a family history of breast cancer should talk to their health care provider about genetic screening. Women at a high risk of breast cancer should talk to their health care providers about having an MRI and a mammogram every year.  Breast cancer gene (BRCA)-related cancer risk assessment is recommended for women who have family members with BRCA-related cancers. BRCA-related cancers include breast, ovarian, tubal, and peritoneal cancers. Having family members with these cancers may be associated with an increased risk for harmful changes (mutations) in the breast cancer genes BRCA1 and  BRCA2. Results of the assessment will determine the need for genetic counseling and BRCA1 and BRCA2 testing.  Your health care provider may recommend that you be screened regularly for cancer of the pelvic organs (ovaries, uterus, and vagina). This screening involves a pelvic examination, including checking for microscopic changes to the surface of your cervix (Pap test). You may be encouraged to have this screening done every 3 years, beginning at age 21.  For women ages 30-65, health care providers may recommend pelvic exams and Pap testing every 3 years, or they may recommend the Pap and pelvic exam, combined with testing for human papilloma virus (HPV), every 5 years. Some types of HPV increase your risk of cervical cancer. Testing for HPV may also be done on women of any age with unclear Pap test results.  Other health care providers may not recommend any screening for nonpregnant women who are considered low risk for pelvic cancer and who do not have symptoms. Ask your health care provider if a screening pelvic exam is right for   you.  If you have had past treatment for cervical cancer or a condition that could lead to cancer, you need Pap tests and screening for cancer for at least 20 years after your treatment. If Pap tests have been discontinued, your risk factors (such as having a new sexual partner) need to be reassessed to determine if screening should resume. Some women have medical problems that increase the chance of getting cervical cancer. In these cases, your health care provider may recommend more frequent screening and Pap tests.  Colorectal cancer can be detected and often prevented. Most routine colorectal cancer screening begins at the age of 50 years and continues through age 75 years. However, your health care provider may recommend screening at an earlier age if you have risk factors for colon cancer. On a yearly basis, your health care provider may provide home test kits to check  for hidden blood in the stool. Use of a small camera at the end of a tube, to directly examine the colon (sigmoidoscopy or colonoscopy), can detect the earliest forms of colorectal cancer. Talk to your health care provider about this at age 50, when routine screening begins. Direct exam of the colon should be repeated every 5-10 years through age 75 years, unless early forms of precancerous polyps or small growths are found.  People who are at an increased risk for hepatitis B should be screened for this virus. You are considered at high risk for hepatitis B if:  You were born in a country where hepatitis B occurs often. Talk with your health care provider about which countries are considered high risk.  Your parents were born in a high-risk country and you have not received a shot to protect against hepatitis B (hepatitis B vaccine).  You have HIV or AIDS.  You use needles to inject street drugs.  You live with, or have sex with, someone who has hepatitis B.  You get hemodialysis treatment.  You take certain medicines for conditions like cancer, organ transplantation, and autoimmune conditions.  Hepatitis C blood testing is recommended for all people born from 1945 through 1965 and any individual with known risks for hepatitis C.  Practice safe sex. Use condoms and avoid high-risk sexual practices to reduce the spread of sexually transmitted infections (STIs). STIs include gonorrhea, chlamydia, syphilis, trichomonas, herpes, HPV, and human immunodeficiency virus (HIV). Herpes, HIV, and HPV are viral illnesses that have no cure. They can result in disability, cancer, and death.  You should be screened for sexually transmitted illnesses (STIs) including gonorrhea and chlamydia if:  You are sexually active and are younger than 24 years.  You are older than 24 years and your health care provider tells you that you are at risk for this type of infection.  Your sexual activity has changed  since you were last screened and you are at an increased risk for chlamydia or gonorrhea. Ask your health care provider if you are at risk.  If you are at risk of being infected with HIV, it is recommended that you take a prescription medicine daily to prevent HIV infection. This is called preexposure prophylaxis (PrEP). You are considered at risk if:  You are sexually active and do not regularly use condoms or know the HIV status of your partner(s).  You take drugs by injection.  You are sexually active with a partner who has HIV.  Talk with your health care provider about whether you are at high risk of being infected with HIV. If   you choose to begin PrEP, you should first be tested for HIV. You should then be tested every 3 months for as long as you are taking PrEP.  Osteoporosis is a disease in which the bones lose minerals and strength with aging. This can result in serious bone fractures or breaks. The risk of osteoporosis can be identified using a bone density scan. Women ages 67 years and over and women at risk for fractures or osteoporosis should discuss screening with their health care providers. Ask your health care provider whether you should take a calcium supplement or vitamin D to reduce the rate of osteoporosis.  Menopause can be associated with physical symptoms and risks. Hormone replacement therapy is available to decrease symptoms and risks. You should talk to your health care provider about whether hormone replacement therapy is right for you.  Use sunscreen. Apply sunscreen liberally and repeatedly throughout the day. You should seek shade when your shadow is shorter than you. Protect yourself by wearing long sleeves, pants, a wide-brimmed hat, and sunglasses year round, whenever you are outdoors.  Once a month, do a whole body skin exam, using a mirror to look at the skin on your back. Tell your health care provider of new moles, moles that have irregular borders, moles that  are larger than a pencil eraser, or moles that have changed in shape or color.  Stay current with required vaccines (immunizations).  Influenza vaccine. All adults should be immunized every year.  Tetanus, diphtheria, and acellular pertussis (Td, Tdap) vaccine. Pregnant women should receive 1 dose of Tdap vaccine during each pregnancy. The dose should be obtained regardless of the length of time since the last dose. Immunization is preferred during the 27th-36th week of gestation. An adult who has not previously received Tdap or who does not know her vaccine status should receive 1 dose of Tdap. This initial dose should be followed by tetanus and diphtheria toxoids (Td) booster doses every 10 years. Adults with an unknown or incomplete history of completing a 3-dose immunization series with Td-containing vaccines should begin or complete a primary immunization series including a Tdap dose. Adults should receive a Td booster every 10 years.  Varicella vaccine. An adult without evidence of immunity to varicella should receive 2 doses or a second dose if she has previously received 1 dose. Pregnant females who do not have evidence of immunity should receive the first dose after pregnancy. This first dose should be obtained before leaving the health care facility. The second dose should be obtained 4-8 weeks after the first dose.  Human papillomavirus (HPV) vaccine. Females aged 13-26 years who have not received the vaccine previously should obtain the 3-dose series. The vaccine is not recommended for use in pregnant females. However, pregnancy testing is not needed before receiving a dose. If a female is found to be pregnant after receiving a dose, no treatment is needed. In that case, the remaining doses should be delayed until after the pregnancy. Immunization is recommended for any person with an immunocompromised condition through the age of 61 years if she did not get any or all doses earlier. During the  3-dose series, the second dose should be obtained 4-8 weeks after the first dose. The third dose should be obtained 24 weeks after the first dose and 16 weeks after the second dose.  Zoster vaccine. One dose is recommended for adults aged 30 years or older unless certain conditions are present.  Measles, mumps, and rubella (MMR) vaccine. Adults born  before 1957 generally are considered immune to measles and mumps. Adults born in 1957 or later should have 1 or more doses of MMR vaccine unless there is a contraindication to the vaccine or there is laboratory evidence of immunity to each of the three diseases. A routine second dose of MMR vaccine should be obtained at least 28 days after the first dose for students attending postsecondary schools, health care workers, or international travelers. People who received inactivated measles vaccine or an unknown type of measles vaccine during 1963-1967 should receive 2 doses of MMR vaccine. People who received inactivated mumps vaccine or an unknown type of mumps vaccine before 1979 and are at high risk for mumps infection should consider immunization with 2 doses of MMR vaccine. For females of childbearing age, rubella immunity should be determined. If there is no evidence of immunity, females who are not pregnant should be vaccinated. If there is no evidence of immunity, females who are pregnant should delay immunization until after pregnancy. Unvaccinated health care workers born before 1957 who lack laboratory evidence of measles, mumps, or rubella immunity or laboratory confirmation of disease should consider measles and mumps immunization with 2 doses of MMR vaccine or rubella immunization with 1 dose of MMR vaccine.  Pneumococcal 13-valent conjugate (PCV13) vaccine. When indicated, a person who is uncertain of his immunization history and has no record of immunization should receive the PCV13 vaccine. All adults 65 years of age and older should receive this  vaccine. An adult aged 19 years or older who has certain medical conditions and has not been previously immunized should receive 1 dose of PCV13 vaccine. This PCV13 should be followed with a dose of pneumococcal polysaccharide (PPSV23) vaccine. Adults who are at high risk for pneumococcal disease should obtain the PPSV23 vaccine at least 8 weeks after the dose of PCV13 vaccine. Adults older than 57 years of age who have normal immune system function should obtain the PPSV23 vaccine dose at least 1 year after the dose of PCV13 vaccine.  Pneumococcal polysaccharide (PPSV23) vaccine. When PCV13 is also indicated, PCV13 should be obtained first. All adults aged 65 years and older should be immunized. An adult younger than age 65 years who has certain medical conditions should be immunized. Any person who resides in a nursing home or long-term care facility should be immunized. An adult smoker should be immunized. People with an immunocompromised condition and certain other conditions should receive both PCV13 and PPSV23 vaccines. People with human immunodeficiency virus (HIV) infection should be immunized as soon as possible after diagnosis. Immunization during chemotherapy or radiation therapy should be avoided. Routine use of PPSV23 vaccine is not recommended for American Indians, [place] Natives, or people younger than 65 years unless there are medical conditions that require PPSV23 vaccine. When indicated, people who have unknown immunization and have no record of immunization should receive PPSV23 vaccine. One-time revaccination 5 years after the first dose of PPSV23 is recommended for people aged 19-64 years who have chronic kidney failure, nephrotic syndrome, asplenia, or immunocompromised conditions. People who received 1-2 doses of PPSV23 before age 65 years should receive another dose of PPSV23 vaccine at age 65 years or later if at least 5 years have passed since the previous dose. Doses of PPSV23 are not  needed for people immunized with PPSV23 at or after age 65 years.  Meningococcal vaccine. Adults with asplenia or persistent complement component deficiencies should receive 2 doses of quadrivalent meningococcal conjugate (MenACWY-D) vaccine. The doses should be obtained   at least 2 months apart. Microbiologists working with certain meningococcal bacteria, [place] recruits, people at risk during an outbreak, and people who travel to or live in countries with a high rate of meningitis should be immunized. A first-year college student up through age 34 years who is living in a residence hall should receive a dose if she did not receive a dose on or after her 16th birthday. Adults who have certain high-risk conditions should receive one or more doses of vaccine.  Hepatitis A vaccine. Adults who wish to be protected from this disease, have certain high-risk conditions, work with hepatitis A-infected animals, work in hepatitis A research labs, or travel to or work in countries with a high rate of hepatitis A should be immunized. Adults who were previously unvaccinated and who anticipate close contact with an international adoptee during the first 60 days after arrival in the [place] from a country with a high rate of hepatitis A should be immunized.  Hepatitis B vaccine. Adults who wish to be protected from this disease, have certain high-risk conditions, may be exposed to blood or other infectious body fluids, are household contacts or sex partners of hepatitis B positive people, are clients or workers in certain care facilities, or travel to or work in countries with a high rate of hepatitis B should be immunized.  Haemophilus influenzae type b (Hib) vaccine. A previously unvaccinated person with asplenia or sickle cell disease or having a scheduled splenectomy should receive 1 dose of Hib vaccine. Regardless of previous immunization, a recipient of a hematopoietic stem cell transplant should receive a  3-dose series 6-12 months after her successful transplant. Hib vaccine is not recommended for adults with HIV infection. Preventive Services / Frequency Ages 35 to 4 years  Blood pressure check.** / Every 3-5 years.  Lipid and cholesterol check.** / Every 5 years beginning at age 60.  Clinical breast exam.** / Every 3 years for women in their 71s and 10s.  BRCA-related cancer risk assessment.** / For women who have family members with a BRCA-related cancer (breast, ovarian, tubal, or peritoneal cancers).  Pap test.** / Every 2 years from ages 76 through 26. Every 3 years starting at age 61 through age 76 or 93 with a history of 3 consecutive normal Pap tests.  HPV screening.** / Every 3 years from ages 37 through ages 60 to 51 with a history of 3 consecutive normal Pap tests.  Hepatitis C blood test.** / For any individual with known risks for hepatitis C.  Skin self-exam. / Monthly.  Influenza vaccine. / Every year.  Tetanus, diphtheria, and acellular pertussis (Tdap, Td) vaccine.** / Consult your health care provider. Pregnant women should receive 1 dose of Tdap vaccine during each pregnancy. 1 dose of Td every 10 years.  Varicella vaccine.** / Consult your health care provider. Pregnant females who do not have evidence of immunity should receive the first dose after pregnancy.  HPV vaccine. / 3 doses over 6 months, if 93 and younger. The vaccine is not recommended for use in pregnant females. However, pregnancy testing is not needed before receiving a dose.  Measles, mumps, rubella (MMR) vaccine.** / You need at least 1 dose of MMR if you were born in 1957 or later. You may also need a 2nd dose. For females of childbearing age, rubella immunity should be determined. If there is no evidence of immunity, females who are not pregnant should be vaccinated. If there is no evidence of immunity, females who are  pregnant should delay immunization until after pregnancy.  Pneumococcal  13-valent conjugate (PCV13) vaccine.** / Consult your health care provider.  Pneumococcal polysaccharide (PPSV23) vaccine.** / 1 to 2 doses if you smoke cigarettes or if you have certain conditions.  Meningococcal vaccine.** / 1 dose if you are age 68 to 8 years and a Market researcher living in a residence hall, or have one of several medical conditions, you need to get vaccinated against meningococcal disease. You may also need additional booster doses.  Hepatitis A vaccine.** / Consult your health care provider.  Hepatitis B vaccine.** / Consult your health care provider.  Haemophilus influenzae type b (Hib) vaccine.** / Consult your health care provider. Ages 7 to 53 years  Blood pressure check.** / Every year.  Lipid and cholesterol check.** / Every 5 years beginning at age 25 years.  Lung cancer screening. / Every year if you are aged 11-80 years and have a 30-pack-year history of smoking and currently smoke or have quit within the past 15 years. Yearly screening is stopped once you have quit smoking for at least 15 years or develop a health problem that would prevent you from having lung cancer treatment.  Clinical breast exam.** / Every year after age 48 years.  BRCA-related cancer risk assessment.** / For women who have family members with a BRCA-related cancer (breast, ovarian, tubal, or peritoneal cancers).  Mammogram.** / Every year beginning at age 41 years and continuing for as long as you are in good health. Consult with your health care provider.  Pap test.** / Every 3 years starting at age 65 years through age 37 or 70 years with a history of 3 consecutive normal Pap tests.  HPV screening.** / Every 3 years from ages 72 years through ages 60 to 40 years with a history of 3 consecutive normal Pap tests.  Fecal occult blood test (FOBT) of stool. / Every year beginning at age 21 years and continuing until age 5 years. You may not need to do this test if you get  a colonoscopy every 10 years.  Flexible sigmoidoscopy or colonoscopy.** / Every 5 years for a flexible sigmoidoscopy or every 10 years for a colonoscopy beginning at age 35 years and continuing until age 48 years.  Hepatitis C blood test.** / For all people born from [birth year] through 1965 and any individual with known risks for hepatitis C.  Skin self-exam. / Monthly.  Influenza vaccine. / Every year.  Tetanus, diphtheria, and acellular pertussis (Tdap/Td) vaccine.** / Consult your health care provider. Pregnant women should receive 1 dose of Tdap vaccine during each pregnancy. 1 dose of Td every 10 years.  Varicella vaccine.** / Consult your health care provider. Pregnant females who do not have evidence of immunity should receive the first dose after pregnancy.  Zoster vaccine.** / 1 dose for adults aged 30 years or older.  Measles, mumps, rubella (MMR) vaccine.** / You need at least 1 dose of MMR if you were born in 1957 or later. You may also need a second dose. For females of childbearing age, rubella immunity should be determined. If there is no evidence of immunity, females who are not pregnant should be vaccinated. If there is no evidence of immunity, females who are pregnant should delay immunization until after pregnancy.  Pneumococcal 13-valent conjugate (PCV13) vaccine.** / Consult your health care provider.  Pneumococcal polysaccharide (PPSV23) vaccine.** / 1 to 2 doses if you smoke cigarettes or if you have certain conditions.  Meningococcal vaccine.** /  Consult your health care provider.  Hepatitis A vaccine.** / Consult your health care provider.  Hepatitis B vaccine.** / Consult your health care provider.  Haemophilus influenzae type b (Hib) vaccine.** / Consult your health care provider. Ages 64 years and over  Blood pressure check.** / Every year.  Lipid and cholesterol check.** / Every 5 years beginning at age 23 years.  Lung cancer screening. / Every year if you  are aged 16-80 years and have a 30-pack-year history of smoking and currently smoke or have quit within the past 15 years. Yearly screening is stopped once you have quit smoking for at least 15 years or develop a health problem that would prevent you from having lung cancer treatment.  Clinical breast exam.** / Every year after age 74 years.  BRCA-related cancer risk assessment.** / For women who have family members with a BRCA-related cancer (breast, ovarian, tubal, or peritoneal cancers).  Mammogram.** / Every year beginning at age 44 years and continuing for as long as you are in good health. Consult with your health care provider.  Pap test.** / Every 3 years starting at age 58 years through age 22 or 39 years with 3 consecutive normal Pap tests. Testing can be stopped between 65 and 70 years with 3 consecutive normal Pap tests and no abnormal Pap or HPV tests in the past 10 years.  HPV screening.** / Every 3 years from ages 64 years through ages 70 or 61 years with a history of 3 consecutive normal Pap tests. Testing can be stopped between 65 and 70 years with 3 consecutive normal Pap tests and no abnormal Pap or HPV tests in the past 10 years.  Fecal occult blood test (FOBT) of stool. / Every year beginning at age 40 years and continuing until age 27 years. You may not need to do this test if you get a colonoscopy every 10 years.  Flexible sigmoidoscopy or colonoscopy.** / Every 5 years for a flexible sigmoidoscopy or every 10 years for a colonoscopy beginning at age 7 years and continuing until age 32 years.  Hepatitis C blood test.** / For all people born from [birth year] through 1965 and any individual with known risks for hepatitis C.  Osteoporosis screening.** / A one-time screening for women ages 30 years and over and women at risk for fractures or osteoporosis.  Skin self-exam. / Monthly.  Influenza vaccine. / Every year.  Tetanus, diphtheria, and acellular pertussis (Tdap/Td)  vaccine.** / 1 dose of Td every 10 years.  Varicella vaccine.** / Consult your health care provider.  Zoster vaccine.** / 1 dose for adults aged 35 years or older.  Pneumococcal 13-valent conjugate (PCV13) vaccine.** / Consult your health care provider.  Pneumococcal polysaccharide (PPSV23) vaccine.** / 1 dose for all adults aged 46 years and older.  Meningococcal vaccine.** / Consult your health care provider.  Hepatitis A vaccine.** / Consult your health care provider.  Hepatitis B vaccine.** / Consult your health care provider.  Haemophilus influenzae type b (Hib) vaccine.** / Consult your health care provider. ** Family history and personal history of risk and conditions may change your health care provider's recommendations.   This information is not intended to replace advice given to you by your health care provider. Make sure you discuss any questions you have with your health care provider.   Document Released: 03/17/2001 Document Revised: 02/09/2014 Document Reviewed: 06/16/2010 Elsevier Interactive Patient Education [employer].

## 2015-09-05 NOTE — Progress Notes (Signed)
Pre visit review using our clinic review tool, if applicable. No additional management support is needed unless otherwise documented below in the visit note. 

## 2015-09-26 DIAGNOSIS — H2511 Age-related nuclear cataract, right eye: Secondary | ICD-10-CM | POA: Diagnosis not present

## 2015-09-29 DIAGNOSIS — J45909 Unspecified asthma, uncomplicated: Secondary | ICD-10-CM | POA: Diagnosis not present

## 2015-10-23 DIAGNOSIS — G4733 Obstructive sleep apnea (adult) (pediatric): Secondary | ICD-10-CM | POA: Diagnosis not present

## 2015-10-30 DIAGNOSIS — J45909 Unspecified asthma, uncomplicated: Secondary | ICD-10-CM | POA: Diagnosis not present

## 2015-11-29 DIAGNOSIS — J45909 Unspecified asthma, uncomplicated: Secondary | ICD-10-CM | POA: Diagnosis not present

## 2015-12-16 ENCOUNTER — Telehealth: Payer: Self-pay | Admitting: Pulmonary Disease

## 2015-12-16 NOTE — Telephone Encounter (Signed)
lmtcb x1 for Mandy with [facility]. Pt currently only uses oxygen at night time.

## 2015-12-19 ENCOUNTER — Telehealth: Payer: Self-pay | Admitting: Family Medicine

## 2015-12-19 NOTE — Telephone Encounter (Signed)
   °  °  Patient was seen 09/05/2015 for her physical appointment with Dr. Etter Sjogren, patient states office called her to remind her to schedule appointment. Patient states [employer] informed her that they only cover physicals every 2 years therefore patient received a $200 bill. Informed patient there is no way provider would have known the change in her insurance. In addition informed patient in order to receive refills she would have had to been seen by her PCP, patient voice understanding. Patient was advised by insurance to contact PCP to see if bill can be prorated patient could not elaborate what that meant ultimately and would like to speak with office manager today to discuss. Please advise    Called patient and she stated the insurance and [facility] are working to fix error. She said insurance said it was not billed correctly. I told patient I see in the account notes that the claim has been reprocessed.

## 2015-12-19 NOTE — Telephone Encounter (Signed)
LMTCB for Mandy at [facility]

## 2015-12-20 NOTE — Telephone Encounter (Signed)
LMTCB x2 FOR Mandy with [facility]

## 2015-12-20 NOTE — Telephone Encounter (Signed)
lmtcb for Mandy at [facility]

## 2015-12-20 NOTE — Telephone Encounter (Signed)
Mandy with [facility] returned call, CB [phone].

## 2015-12-23 NOTE — Telephone Encounter (Signed)
Spoke with Leafy Ro at [facility], states that pt needs a new cpap titration study.  Pt's last study was done in 2014 and this is too old per medicare guidelines.  VS please advise.  Thanks!

## 2015-12-24 NOTE — Telephone Encounter (Signed)
I am unfamiliar with this part of medicare guidelines.  Please have [facility] forward a copy of this information for my review.

## 2015-12-24 NOTE — Telephone Encounter (Signed)
Spoke with Molly Klein from [facility] and she is faxing the information over for VS to review. Nothing further is needed at this time.   Message forwarded to Ashtyn to follow up with.

## 2015-12-27 NOTE — Telephone Encounter (Signed)
Dr Halford Chessman, the information you requested is in your look at.

## 2015-12-30 DIAGNOSIS — J45909 Unspecified asthma, uncomplicated: Secondary | ICD-10-CM | POA: Diagnosis not present

## 2016-01-03 NOTE — Telephone Encounter (Signed)
VS please advise if we need to order something different for the pt or can we close this message. thanks

## 2016-01-10 NOTE — Telephone Encounter (Signed)
VS please advise.  thanks 

## 2016-01-13 NOTE — Telephone Encounter (Signed)
Molly Klein, has VS given you anything regarding patient? Can we close note? Thanks.

## 2016-01-15 ENCOUNTER — Other Ambulatory Visit: Payer: Self-pay

## 2016-01-15 DIAGNOSIS — G4733 Obstructive sleep apnea (adult) (pediatric): Secondary | ICD-10-CM

## 2016-01-15 NOTE — Telephone Encounter (Signed)
Spoke with pt. And gave her VS message.  She agreed to repeat lab study. The order has been placed. Nothing further is needed at this time.

## 2016-01-15 NOTE — Telephone Encounter (Signed)
Ashtyn, please advise. Thanks.

## 2016-01-15 NOTE — Telephone Encounter (Signed)
Please inform pt that her insurance requires her to have in lab CPAP titration study in order to requalify for nocturnal oxygen use with CPAP.  If she is agreeable, then please schedule CPAP titration study.

## 2016-01-15 NOTE — Telephone Encounter (Signed)
Dr Halford Chessman please advise if there was anything that needed to be changed or if this has already been handled with PCC's. Looks as though you were wanting to review the Medicare guidelines to see if the patient needed any additional orders.

## 2016-01-21 DIAGNOSIS — G4733 Obstructive sleep apnea (adult) (pediatric): Secondary | ICD-10-CM | POA: Diagnosis not present

## 2016-01-23 DIAGNOSIS — H2512 Age-related nuclear cataract, left eye: Secondary | ICD-10-CM | POA: Diagnosis not present

## 2016-01-29 DIAGNOSIS — J45909 Unspecified asthma, uncomplicated: Secondary | ICD-10-CM | POA: Diagnosis not present

## 2016-01-30 DIAGNOSIS — H2512 Age-related nuclear cataract, left eye: Secondary | ICD-10-CM | POA: Diagnosis not present

## 2016-02-06 DIAGNOSIS — Z961 Presence of intraocular lens: Secondary | ICD-10-CM | POA: Diagnosis not present

## 2016-02-18 ENCOUNTER — Encounter (HOSPITAL_BASED_OUTPATIENT_CLINIC_OR_DEPARTMENT_OTHER): Payer: Medicare HMO

## 2016-02-25 DIAGNOSIS — G4733 Obstructive sleep apnea (adult) (pediatric): Secondary | ICD-10-CM | POA: Diagnosis not present

## 2016-02-29 DIAGNOSIS — J45909 Unspecified asthma, uncomplicated: Secondary | ICD-10-CM | POA: Diagnosis not present

## 2016-03-24 ENCOUNTER — Encounter (HOSPITAL_BASED_OUTPATIENT_CLINIC_OR_DEPARTMENT_OTHER): Payer: Medicare HMO

## 2016-03-31 ENCOUNTER — Ambulatory Visit (INDEPENDENT_AMBULATORY_CARE_PROVIDER_SITE_OTHER): Payer: Medicare HMO | Admitting: Family Medicine

## 2016-03-31 ENCOUNTER — Encounter: Payer: Self-pay | Admitting: Family Medicine

## 2016-03-31 VITALS — BP 138/82 | HR 80 | Temp 97.7°F | Resp 18 | Ht 63.0 in | Wt 258.2 lb

## 2016-03-31 DIAGNOSIS — J45909 Unspecified asthma, uncomplicated: Secondary | ICD-10-CM | POA: Diagnosis not present

## 2016-03-31 DIAGNOSIS — J40 Bronchitis, not specified as acute or chronic: Secondary | ICD-10-CM | POA: Diagnosis not present

## 2016-03-31 DIAGNOSIS — J069 Acute upper respiratory infection, unspecified: Secondary | ICD-10-CM | POA: Diagnosis not present

## 2016-03-31 DIAGNOSIS — J45901 Unspecified asthma with (acute) exacerbation: Secondary | ICD-10-CM

## 2016-03-31 DIAGNOSIS — J209 Acute bronchitis, unspecified: Secondary | ICD-10-CM

## 2016-03-31 MED ORDER — FLUTICASONE PROPIONATE 50 MCG/ACT NA SUSP: 2.0000 | Freq: Every day | NASAL | 6 refills | Status: DC

## 2016-03-31 MED ORDER — BUDESONIDE-FORMOTEROL FUMARATE 160-4.5 MCG/ACT IN AERO: 2.0000 | INHALATION_SPRAY | Freq: Two times a day (BID) | RESPIRATORY_TRACT | 3 refills | Status: DC

## 2016-03-31 MED ORDER — LEVOCETIRIZINE DIHYDROCHLORIDE 5 MG PO TABS: 5.0000 mg | ORAL_TABLET | Freq: Every evening | ORAL | 5 refills | Status: DC

## 2016-03-31 MED ORDER — ALBUTEROL SULFATE HFA 108 (90 BASE) MCG/ACT IN AERS: 2.0000 | INHALATION_SPRAY | Freq: Four times a day (QID) | RESPIRATORY_TRACT | 6 refills | Status: DC | PRN

## 2016-03-31 MED ORDER — AZITHROMYCIN 250 MG PO TABS: ORAL_TABLET | ORAL | 0 refills | Status: DC

## 2016-03-31 NOTE — Progress Notes (Signed)
Pre visit review using our clinic review tool, if applicable. No additional management support is needed unless otherwise documented below in the visit note. 

## 2016-03-31 NOTE — Patient Instructions (Signed)

## 2016-03-31 NOTE — Progress Notes (Signed)
Patient ID: Molly Klein, female    DOB: 1960-08-25  Age: 56 y.o. MRN: [id number]    Subjective:  Subjective  HPI Hilda Elem King-[place] presents for cough x weeks.  + productive.  No fever no otc meds.  Pt thinks her cpap hose maybe contributing .   + wheeze.   She has been unable to clean hose because she has no hot water--- water heater broke.    Review of Systems  Constitutional: Positive for chills. Negative for appetite change, diaphoresis, fatigue, fever and unexpected weight change.  HENT: Positive for congestion, postnasal drip, rhinorrhea and sinus pressure.   Eyes: Negative for pain, redness and visual disturbance.  Respiratory: Positive for cough and wheezing. Negative for chest tightness and shortness of breath.   Cardiovascular: Negative for chest pain, palpitations and leg swelling.  Endocrine: Negative for cold intolerance, heat intolerance, polydipsia, polyphagia and polyuria.  Genitourinary: Negative for difficulty urinating, dysuria and frequency.  Allergic/Immunologic: Negative for environmental allergies.  Neurological: Negative for dizziness, light-headedness, numbness and headaches.    History Past Medical History:  Diagnosis Date  . Asthma   . Bruising 10/21/2012  . DVT (deep venous thrombosis) ([place])   . GERD (gastroesophageal reflux disease)   . Hepatitis C   . History of DVT of lower extremity 10/21/2012  . Knee pain    left   . Neuromuscular disorder (HCC)    neuropathy lt arm  . Nipple discharge    right breast  . OSA (obstructive sleep apnea)     She has a past surgical history that includes Cholecystectomy; Pilonidal cyst excision; Dilation and curettage of uterus; Tonsillectomy; Abdominal hysterectomy; Fracture surgery; Breast biopsy (02/06/2011); right knee surgery; and Nipple repair.   Her family history includes Alcohol abuse in her mother; Cancer in her maternal grandmother and sister; Heart attack in her father and mother; Rectal cancer in her  maternal grandmother.She reports that she quit smoking about 41 years ago. Her smoking use included Cigarettes. She quit after 5.00 years of use. She has never used smokeless tobacco. She reports that she does not drink alcohol or use drugs.  Current Outpatient Prescriptions on File Prior to Visit  Medication Sig Dispense Refill  . albuterol (PROVENTIL) (2.5 MG/3ML) 0.083% nebulizer solution INHALE 1 VIAL BY NEBULIZER AS DIRECTED 75 mL 0  . beclomethasone (QVAR) 40 MCG/ACT inhaler Inhale 2 puffs into the lungs 2 (two) times daily. 1 Inhaler 12  . BESIVANCE 0.6 % SUSP     . DUREZOL 0.05 % EMUL     . gabapentin (NEURONTIN) 300 MG capsule Take 1 capsule (300 mg total) by mouth every evening. 90 capsule 0  . ofloxacin (FLOXIN OTIC) 0.3 % otic solution Place 10 drops into the left ear daily. 5 mL 0  . PROLENSA 0.07 % SOLN     . ranitidine (ZANTAC) 150 MG tablet TAKE 1 TABLET(150 MG) BY MOUTH TWICE DAILY 180 tablet 3  . ranitidine (ZANTAC) 150 MG tablet TAKE 1 TABLET(150 MG) BY MOUTH TWICE DAILY 60 tablet 0  . valACYclovir (VALTREX) 1000 MG tablet Take 1 tablet (1,000 mg total) by mouth 3 (three) times daily. 30 tablet 0   No current facility-administered medications on file prior to visit.      Objective:  Objective  Physical Exam  Constitutional: She is oriented to person, place, and time. She appears well-developed and well-nourished.  HENT:  Right Ear: External ear normal.  Left Ear: External ear normal.  Nose: Right sinus exhibits maxillary  sinus tenderness and frontal sinus tenderness. Left sinus exhibits maxillary sinus tenderness and frontal sinus tenderness.  Mouth/Throat: Posterior oropharyngeal erythema present.  + PND + errythema  Eyes: Conjunctivae are normal. Right eye exhibits no discharge. Left eye exhibits no discharge.  Cardiovascular: Normal rate, regular rhythm and normal heart sounds.   No murmur heard. Pulmonary/Chest: Effort normal and breath sounds normal. No  respiratory distress. She has no wheezes. She has no rales. She exhibits no tenderness.  Musculoskeletal: She exhibits no edema.  Lymphadenopathy:    She has cervical adenopathy.  Neurological: She is alert and oriented to person, place, and time.  Psychiatric: She has a normal mood and affect. Her behavior is normal. Judgment and thought content normal.  Nursing note and vitals reviewed.  BP 138/82 (BP Location: Right Arm, Patient Position: Sitting, Cuff Size: Large)   Pulse 80   Temp 97.7 F (36.5 C) (Oral)   Resp 18   Ht 5\' 3"  (1.6 m)   Wt 258 lb 3.2 oz (117.1 kg)   SpO2 97%   BMI 45.74 kg/m  Wt Readings from Last 3 Encounters:  03/31/16 258 lb 3.2 oz (117.1 kg)  09/05/15 242 lb 3.2 oz (109.9 kg)  09/02/15 242 lb 12.8 oz (110.1 kg)     Lab Results  Component Value Date   WBC 4.2 09/05/2015   HGB 14.8 09/05/2015   HCT 43.7 09/05/2015   PLT 210.0 09/05/2015   GLUCOSE 93 09/05/2015   CHOL 170 09/05/2015   TRIG 60.0 09/05/2015   HDL 54.40 09/05/2015   LDLCALC 103 (H) 09/05/2015   ALT 24 09/05/2015   AST 18 09/05/2015   NA 140 09/05/2015   K 4.4 09/05/2015   CL 104 09/05/2015   CREATININE 0.72 09/05/2015   BUN 15 09/05/2015   CO2 30 09/05/2015   TSH 1.65 09/05/2015   INR 1.04 02/11/2010   HGBA1C 5.5 07/07/2011   MICROALBUR 1.1 09/05/2015    US Venous Img Lower Unilateral Left  Result Date: 09/02/2015 CLINICAL DATA:  History a left knee Baker's cysts. Left knee swelling and tightness for 2 weeks. EXAM: LEFT LOWER EXTREMITY VENOUS DUPLEX ULTRASOUND TECHNIQUE: Doppler venous assessment of the left lower extremity deep venous system was performed, including characterization of spectral flow, compressibility, and phasicity. COMPARISON:  None. FINDINGS: There is complete compressibility of the common femoral and popliteal veins. Imaging of the distal femoral vein was limited. The femoral vein is grossly compressible and without venous thrombosis. Doppler analysis  demonstrates respiratory phasicity and augmentation of flow with calf compression. No obvious superficial vein or calf vein thrombosis. IMPRESSION: No evidence of acute left lower extremity DVT. Electronically Signed   By: Marybelle Killings M.D.   On: 09/02/2015 12:23    Assessment & Plan:  Plan  I am having Ms. Prakash start on azithromycin, fluticasone, levocetirizine, and budesonide-formoterol. I am also having her maintain her gabapentin, albuterol, valACYclovir, ranitidine, beclomethasone, ranitidine, BESIVANCE, PROLENSA, DUREZOL, ofloxacin, and albuterol.  Meds ordered this encounter  Medications  . azithromycin (ZITHROMAX Z-PAK) 250 MG tablet    Sig: As directed    Dispense:  6 each    Refill:  0  . fluticasone (FLONASE) 50 MCG/ACT nasal spray    Sig: Place 2 sprays into both nostrils daily.    Dispense:  16 g    Refill:  6  . levocetirizine (XYZAL) 5 MG tablet    Sig: Take 1 tablet (5 mg total) by mouth every evening.    Dispense:  30 tablet    Refill:  5  . albuterol (PROVENTIL HFA;VENTOLIN HFA) 108 (90 Base) MCG/ACT inhaler    Sig: Inhale 2 puffs into the lungs 4 (four) times daily as needed. Shortness of breath    Dispense:  1 Inhaler    Refill:  6  . budesonide-formoterol (SYMBICORT) 160-4.5 MCG/ACT inhaler    Sig: Inhale 2 puffs into the lungs 2 (two) times daily.    Dispense:  1 Inhaler    Refill:  3    Problem List Items Addressed This Visit      Unprioritized   Acute bronchitis with asthma with acute exacerbation   Relevant Medications   albuterol (PROVENTIL HFA;VENTOLIN HFA) 108 (90 Base) MCG/ACT inhaler   budesonide-formoterol (SYMBICORT) 160-4.5 MCG/ACT inhaler    Other Visit Diagnoses    Bronchitis    -  Primary   Relevant Medications   azithromycin (ZITHROMAX Z-PAK) 250 MG tablet   Upper respiratory tract infection, unspecified type       Relevant Medications   azithromycin (ZITHROMAX Z-PAK) 250 MG tablet   fluticasone (FLONASE) 50 MCG/ACT nasal spray    levocetirizine (XYZAL) 5 MG tablet      Follow-up: No Follow-up on file.  [name], DO

## 2016-04-12 DIAGNOSIS — S838X1A Sprain of other specified parts of right knee, initial encounter: Secondary | ICD-10-CM | POA: Diagnosis not present

## 2016-04-28 DIAGNOSIS — J45909 Unspecified asthma, uncomplicated: Secondary | ICD-10-CM | POA: Diagnosis not present

## 2016-05-11 ENCOUNTER — Ambulatory Visit (HOSPITAL_BASED_OUTPATIENT_CLINIC_OR_DEPARTMENT_OTHER): Payer: Medicare HMO | Attending: Pulmonary Disease | Admitting: Pulmonary Disease

## 2016-05-11 VITALS — Ht 63.0 in | Wt 262.0 lb

## 2016-05-11 DIAGNOSIS — G4733 Obstructive sleep apnea (adult) (pediatric): Secondary | ICD-10-CM | POA: Diagnosis not present

## 2016-05-11 DIAGNOSIS — Z9989 Dependence on other enabling machines and devices: Secondary | ICD-10-CM

## 2016-05-12 ENCOUNTER — Encounter (HOSPITAL_BASED_OUTPATIENT_CLINIC_OR_DEPARTMENT_OTHER): Payer: Medicare HMO

## 2016-05-29 DIAGNOSIS — J45909 Unspecified asthma, uncomplicated: Secondary | ICD-10-CM | POA: Diagnosis not present

## 2016-05-31 ENCOUNTER — Telehealth: Payer: Self-pay | Admitting: Pulmonary Disease

## 2016-05-31 DIAGNOSIS — Z9989 Dependence on other enabling machines and devices: Secondary | ICD-10-CM | POA: Diagnosis not present

## 2016-05-31 DIAGNOSIS — G4733 Obstructive sleep apnea (adult) (pediatric): Secondary | ICD-10-CM

## 2016-05-31 NOTE — Telephone Encounter (Signed)
CPAP titration 05/21/16 >> CPAP 16 cm H2O >> AHI 2.7   Will have my nurse inform pt that she did well with CPAP during sleep study.  She did not need supplemental oxygen during sleep study while using CPAP.  Please schedule her for next available ROV with me to discuss in more detail.

## 2016-05-31 NOTE — Procedures (Signed)
    Patient Name: Molly Klein, Molly Klein Date: 05/11/2016   Gender: Female  D.O.B: 09/19/1960  Age (years): 19  Referring Provider: Chesley Mires MD, ABSM  Height (inches): 63  Interpreting Physician: Chesley Mires MD, ABSM  Weight (lbs): 262  RPSGT: Carolin Coy   BMI: 20  MRN: [id number]  Neck Size: 15.00    CLINICAL INFORMATION  The patient is referred for a CPAP titration to treat sleep apnea. Date of NPSG: 02/10/12, AHI 87.7/ SLEEP STUDY TECHNIQUE  As per the AASM Manual for the Scoring of Sleep and Associated Events v2.3 (April 2016) with a hypopnea requiring 4% desaturations.  The channels recorded and monitored were frontal, central and occipital EEG, electrooculogram (EOG), submentalis EMG (chin), nasal and oral airflow, thoracic and abdominal wall motion, anterior tibialis EMG, snore microphone, electrocardiogram, and pulse oximetry. Continuous positive airway pressure (CPAP) was initiated at the beginning of the study and titrated to treat sleep-disordered breathing. MEDICATIONS  Medications self-administered by patient taken the night of the study : N/A  TECHNICIAN COMMENTS  Comments added by technician: PATIENT WAS ORDERED AS A CPAP TITRATION  Comments added by scorer: N/A  RESPIRATORY PARAMETERS  Optimal PAP Pressure (cm): 16 AHI at Optimal Pressure (/hr): 2.7  Overall Minimal O2 (%): 87.00 Supine % at Optimal Pressure (%): 88  Minimal O2 at Optimal Pressure (%): 92.0    SLEEP ARCHITECTURE  The study was initiated at 9:46:54 PM and ended at 4:41:27 AM.  Sleep onset time was 26.9 minutes and the sleep efficiency was 75.4%. The total sleep time was 312.6 minutes.  The patient spent 12.15% of the night in stage N1 sleep, 73.45% in stage N2 sleep, 0.00% in stage N3 and 14.39% in REM.Stage REM latency was 104.5 minutes  Wake after sleep onset was 75.0. Alpha intrusion was absent. Supine sleep was 87.32%. CARDIAC DATA  The 2 lead EKG demonstrated sinus rhythm. The  mean heart rate was 70.78 beats per minute. Other EKG findings include: PVCs.  LEG MOVEMENT DATA  The total Periodic Limb Movements of Sleep (PLMS) were 0. The PLMS index was 0.00. A PLMS index of <15 is considered normal in adults. IMPRESSIONS  - The optimal PAP pressure was 16 cm of water. DIAGNOSIS  - Obstructive Sleep Apnea (327.23 [G47.33 ICD-10]) RECOMMENDATIONS  - Trial of CPAP therapy on 16 cm H2O.  - She was fitte with a Medium size Philips Respironics Nasal Pillow Mask Nuance Pro Gel mask and heated humidification. [Electronically signed] 05/31/2016 11:23 PM Chesley Mires MD, [place], American Board of Sleep Medicine  NPI: [id number]

## 2016-06-03 NOTE — Telephone Encounter (Signed)
LM x 1 

## 2016-06-04 NOTE — Telephone Encounter (Signed)
Advised pt of results and made appt with Dr. Halford Chessman to discuss. Pt verbalized understanding and nothing further is needed.

## 2016-06-26 DIAGNOSIS — G4733 Obstructive sleep apnea (adult) (pediatric): Secondary | ICD-10-CM | POA: Diagnosis not present

## 2016-06-28 DIAGNOSIS — J45909 Unspecified asthma, uncomplicated: Secondary | ICD-10-CM | POA: Diagnosis not present

## 2016-07-29 DIAGNOSIS — J45909 Unspecified asthma, uncomplicated: Secondary | ICD-10-CM | POA: Diagnosis not present

## 2016-07-31 ENCOUNTER — Encounter: Payer: Self-pay | Admitting: Family Medicine

## 2016-07-31 ENCOUNTER — Ambulatory Visit (INDEPENDENT_AMBULATORY_CARE_PROVIDER_SITE_OTHER): Payer: Medicare HMO | Admitting: Family Medicine

## 2016-07-31 VITALS — BP 106/60 | HR 74 | Temp 98.2°F | Ht 63.0 in | Wt 255.4 lb

## 2016-07-31 DIAGNOSIS — M545 Low back pain, unspecified: Secondary | ICD-10-CM

## 2016-07-31 LAB — POC URINALSYSI DIPSTICK (AUTOMATED)
Bilirubin, UA: NEGATIVE
Glucose, UA: NEGATIVE
Leukocytes, UA: NEGATIVE
NITRITE UA: NEGATIVE
PH UA: 6 (ref 5.0–8.0)
Protein, UA: NEGATIVE
RBC UA: NEGATIVE
Spec Grav, UA: >=1.03 — AB (ref 1.010–1.025)
UROBILINOGEN UA: 0.2 U/dL

## 2016-07-31 MED ORDER — BACLOFEN 10 MG PO TABS: 10.0000 mg | ORAL_TABLET | Freq: Three times a day (TID) | ORAL | 0 refills | Status: DC | PRN

## 2016-07-31 MED ORDER — NAPROXEN 500 MG PO TBEC: 500.0000 mg | DELAYED_RELEASE_TABLET | Freq: Two times a day (BID) | ORAL | 0 refills | Status: DC

## 2016-07-31 NOTE — Progress Notes (Signed)
Musculoskeletal Exam  Patient: Molly Klein DOB: [birth year]  DOS: 07/31/2016  SUBJECTIVE:  Chief Complaint:   Chief Complaint  Patient presents with  . Back Pain    mid (R) side-x 2 weeks    Molly Klein is a 56 y.o.  female for evaluation and treatment of back pain.   Onset:  2 weeks ago.  Location: Mid R back Character:  aching  Bracing/support under the area make it better. Certain positions make it worse. She is concerned it is related to a UTI, but is not having any urinary symptoms. Progression of issue:  is unchanged Associated symptoms: None Treatment: to date has been: none.   Neurovascular symptoms: no  ROS: Musculoskeletal/Extremities: +back pain Neurologic: no numbness, tingling no weakness   Past Medical History:  Diagnosis Date  . Asthma   . Bruising 10/21/2012  . DVT (deep venous thrombosis) ([place])   . GERD (gastroesophageal reflux disease)   . Hepatitis C   . History of DVT of lower extremity 10/21/2012  . Knee pain    left   . Neuromuscular disorder (HCC)    neuropathy lt arm  . Nipple discharge    right breast  . OSA (obstructive sleep apnea)    Past Surgical History:  Procedure Laterality Date  . ABDOMINAL HYSTERECTOMY    . BREAST BIOPSY  02/06/2011   Procedure: BREAST BIOPSY;  Surgeon: Earnstine Regal, MD;  Location: [facility];  Service: General;  Laterality: Right;  right breast biopsy  . CHOLECYSTECTOMY    . DILATION AND CURETTAGE OF UTERUS    . FRACTURE SURGERY     fx lt arm  . Nipple repair    . PILONIDAL CYST EXCISION    . right knee surgery    . TONSILLECTOMY     Family History  Problem Relation Age of Onset  . Rectal cancer Maternal Grandmother   . Cancer Sister        pt unaware of what kind  . Cancer Maternal Grandmother        breast  . Heart attack Mother        mi in 81s  . Alcohol abuse Mother        cirrhosis of liver  . Heart attack Father        MI in 62s   Current Outpatient  Prescriptions  Medication Sig Dispense Refill  . albuterol (PROVENTIL HFA;VENTOLIN HFA) 108 (90 Base) MCG/ACT inhaler Inhale 2 puffs into the lungs 4 (four) times daily as needed. Shortness of breath 1 Inhaler 6  . albuterol (PROVENTIL) (2.5 MG/3ML) 0.083% nebulizer solution INHALE 1 VIAL BY NEBULIZER AS DIRECTED 75 mL 0  . BESIVANCE 0.6 % SUSP     . budesonide-formoterol (SYMBICORT) 160-4.5 MCG/ACT inhaler Inhale 2 puffs into the lungs 2 (two) times daily. 1 Inhaler 3  . DUREZOL 0.05 % EMUL     . fluticasone (FLONASE) 50 MCG/ACT nasal spray Place 2 sprays into both nostrils daily. 16 g 6  . levocetirizine (XYZAL) 5 MG tablet Take 1 tablet (5 mg total) by mouth every evening. 30 tablet 5  . PROLENSA 0.07 % SOLN     . ranitidine (ZANTAC) 150 MG tablet TAKE 1 TABLET(150 MG) BY MOUTH TWICE DAILY 60 tablet 0  . valACYclovir (VALTREX) 1000 MG tablet Take 1 tablet (1,000 mg total) by mouth 3 (three) times daily. 30 tablet 0  . beclomethasone (QVAR) 40 MCG/ACT inhaler Inhale 2 puffs into the lungs 2 (two)  times daily. (Patient not taking: Reported on 07/31/2016) 1 Inhaler 12  . gabapentin (NEURONTIN) 300 MG capsule Take 1 capsule (300 mg total) by mouth every evening. (Patient not taking: Reported on 07/31/2016) 90 capsule 0   Allergies  Allergen Reactions  . Codeine Nausea And Vomiting  . Interferon Beta-1a   . Sulfonamide Derivatives Nausea Only   Social History   Social History  . Marital status: Married   Occupational History  . retired Disabled   Social History Main Topics  . Smoking status: Former Smoker    Years: 5.00    Types: Cigarettes    Quit date: 02/03/1975  . Smokeless tobacco: Never Used     Comment: 1 pack per week  . Alcohol use No  . Drug use: No  . Sexual activity: Yes    Partners: Male   Other Topics Concern  . Not on file   Social History Narrative   Exercise-- 2-3 days a week    Objective: VITAL SIGNS: BP 106/60 (BP Location: Left Arm, Patient Position:  Sitting, Cuff Size: Large)   Pulse 74   Temp 98.2 F (36.8 C) (Oral)   Ht 5\' 3"  (1.6 m)   Wt 255 lb 6.4 oz (115.8 kg)   SpO2 94%   BMI 45.24 kg/m  Constitutional: Well formed, well developed. No acute distress. Cardiovascular: Brisk cap refill Thorax & Lungs: No accessory muscle use Extremities: No clubbing. No cyanosis. No edema.  Skin: Warm. Dry. No erythema. No rash.  Musculoskeletal: back.   Tenderness to palpation: yes over R erector spinae group at approx T11-L2 Deformity: no Ecchymosis: no Tests positive: None Tests negative: Straight leg, lesegue b/l Poor ROM of hamstrings b/l Neurologic: Normal sensory function. No focal deficits noted. DTR's equal and symmetry in UE's and LE's. No clonus. Psychiatric: Normal mood. Age appropriate judgment and insight. Alert & oriented x 3.    Assessment:  Acute right-sided low back pain without sciatica - Plan: baclofen (LIORESAL) 10 MG tablet, naproxen (EC NAPROSYN) 500 MG EC tablet, POCT Urinalysis Dipstick (Automated)  Plan: Orders as above. UA neg.  No red flag signs. NSAIDs, heat, Tylenol, stretches given, muscle relaxant prn. Encouraged yoga for form of exercise when she is doing better. Avoid aggravating activities.  F/u prn.  The patient voiced understanding and agreement to the plan.   Bishop, DO 07/31/16  3:17 PM

## 2016-07-31 NOTE — Patient Instructions (Addendum)
Heat (pad or rice pillow in microwave) over affected area, 10-15 minutes every 2-3 hours while awake.   OK to take Tylenol 1000 mg (2 extra strength tabs) or 975 mg (3 regular strength tabs) every 6 hours as needed.  If your urine is normal, we will not contact you. If abnormal, we will be giving you a call.   EXERCISES  RANGE OF MOTION (ROM) AND STRETCHING EXERCISES - Low Back Sprain Most people with lower back pain will find that their symptoms get worse with excessive bending forward (flexion) or arching at the lower back (extension). The exercises that will help resolve your symptoms will focus on the opposite motion.  Your physician, physical therapist or athletic trainer will help you determine which exercises will be most helpful to resolve your lower back pain. Do not complete any exercises without first consulting with your caregiver. Discontinue any exercises which make your symptoms worse, until you speak to your caregiver. If you have pain, numbness or tingling which travels down into your buttocks, leg or foot, the goal of the therapy is for these symptoms to move closer to your back and eventually resolve. Sometimes, these leg symptoms will get better, but your lower back pain may worsen. This is often an indication of progress in your rehabilitation. Be very alert to any changes in your symptoms and the activities in which you participated in the 24 hours prior to the change. Sharing this information with your caregiver will allow him or her to most efficiently treat your condition. These exercises may help you when beginning to rehabilitate your injury. Your symptoms may resolve with or without further involvement from your physician, physical therapist or athletic trainer. While completing these exercises, remember:   Restoring tissue flexibility helps normal motion to return to the joints. This allows healthier, less painful movement and activity.  An effective stretch should be held  for at least 30 seconds.  A stretch should never be painful. You should only feel a gentle lengthening or release in the stretched tissue. FLEXION RANGE OF MOTION AND STRETCHING EXERCISES:  STRETCH - Flexion, Single Knee to Chest   Lie on a firm bed or floor with both legs extended in front of you.  Keeping one leg in contact with the floor, bring your opposite knee to your chest. Hold your leg in place by either grabbing behind your thigh or at your knee.  Pull until you feel a gentle stretch in your low back. Hold 15-20 seconds.  Slowly release your grasp and repeat the exercise with the opposite side. Repeat 2 times. Complete this exercise 1-2 times per day.   STRETCH - Flexion, Double Knee to Chest  Lie on a firm bed or floor with both legs extended in front of you.  Keeping one leg in contact with the floor, bring your opposite knee to your chest.  Tense your stomach muscles to support your back and then lift your other knee to your chest. Hold your legs in place by either grabbing behind your thighs or at your knees.  Pull both knees toward your chest until you feel a gentle stretch in your low back. Hold 15-20 seconds.  Tense your stomach muscles and slowly return one leg at a time to the floor. Repeat 2 times. Complete this exercise 1-2 times per day.   STRETCH - Low Trunk Rotation  Lie on a firm bed or floor. Keeping your legs in front of you, bend your knees so they are both  pointed toward the ceiling and your feet are flat on the floor.  Extend your arms out to the side. This will stabilize your upper body by keeping your shoulders in contact with the floor.  Gently and slowly drop both knees together to one side until you feel a gentle stretch in your low back. Hold for 15-20 seconds.  Tense your stomach muscles to support your lower back as you bring your knees back to the starting position. Repeat the exercise to the other side. Repeat 2 times. Complete this  exercise 1-2 times per day  EXTENSION RANGE OF MOTION AND FLEXIBILITY EXERCISES:  STRETCH - Extension, Prone on Elbows   Lie on your stomach on the floor, a bed will be too soft. Place your palms about shoulder width apart and at the height of your head.  Place your elbows under your shoulders. If this is too painful, stack pillows under your chest.  Allow your body to relax so that your hips drop lower and make contact more completely with the floor.  Hold this position for 15-20 seconds.  Slowly return to lying flat on the floor. Repeat 2 times. Complete this exercise 1-2 times per day.   RANGE OF MOTION - Extension, Prone Press Ups  Lie on your stomach on the floor, a bed will be too soft. Place your palms about shoulder width apart and at the height of your head.  Keeping your back as relaxed as possible, slowly straighten your elbows while keeping your hips on the floor. You may adjust the placement of your hands to maximize your comfort. As you gain motion, your hands will come more underneath your shoulders.  Hold this position 15-20 seconds.  Slowly return to lying flat on the floor. Repeat 2 times. Complete this exercise 1-2 times per day.   RANGE OF MOTION- Quadruped, Neutral Spine   Assume a hands and knees position on a firm surface. Keep your hands under your shoulders and your knees under your hips. You may place padding under your knees for comfort.  Drop your head and point your tailbone toward the ground below you. This will round out your lower back like an angry cat. Hold this position for 15-20 seconds.  Slowly lift your head and release your tail bone so that your back sags into a large arch, like an old horse.  Hold this position for 15-20 seconds.  Repeat this until you feel limber in your low back.  Now, find your "sweet spot." This will be the most comfortable position somewhere between the two previous positions. This is your neutral spine. Once you  have found this position, tense your stomach muscles to support your low back.  Hold this position for 15-20 seconds. Repeat 2 times. Complete this exercise 1-2 times per day.  STRENGTHENING EXERCISES - Low Back Sprain These exercises may help you when beginning to rehabilitate your injury. These exercises should be done near your "sweet spot." This is the neutral, low-back arch, somewhere between fully rounded and fully arched, that is your least painful position. When performed in this safe range of motion, these exercises can be used for people who have either a flexion or extension based injury. These exercises may resolve your symptoms with or without further involvement from your physician, physical therapist or athletic trainer. While completing these exercises, remember:   Muscles can gain both the endurance and the strength needed for everyday activities through controlled exercises.  Complete these exercises as instructed by your  physician, physical therapist or athletic trainer. Increase the resistance and repetitions only as guided.  You may experience muscle soreness or fatigue, but the pain or discomfort you are trying to eliminate should never worsen during these exercises. If this pain does worsen, stop and make certain you are following the directions exactly. If the pain is still present after adjustments, discontinue the exercise until you can discuss the trouble with your caregiver.  STRENGTHENING - Deep Abdominals, Pelvic Tilt   Lie on a firm bed or floor. Keeping your legs in front of you, bend your knees so they are both pointed toward the ceiling and your feet are flat on the floor.  Tense your lower abdominal muscles to press your low back into the floor. This motion will rotate your pelvis so that your tail bone is scooping upwards rather than pointing at your feet or into the floor. With a gentle tension and even breathing, hold this position for 10-15 seconds. Repeat 2  times. Complete this exercise 1 time per day.   STRENGTHENING - Abdominals, Crunches   Lie on a firm bed or floor. Keeping your legs in front of you, bend your knees so they are both pointed toward the ceiling and your feet are flat on the floor. Cross your arms over your chest.  Slightly tip your chin down without bending your neck.  Tense your abdominals and slowly lift your trunk high enough to just clear your shoulder blades. Lifting higher can put excessive stress on the lower back and does not further strengthen your abdominal muscles.  Control your return to the starting position. Repeat 2 times. Complete this exercise once every 1-2 days.   STRENGTHENING - Quadruped, Opposite UE/LE Lift   Assume a hands and knees position on a firm surface. Keep your hands under your shoulders and your knees under your hips. You may place padding under your knees for comfort.  Find your neutral spine and gently tense your abdominal muscles so that you can maintain this position. Your shoulders and hips should form a rectangle that is parallel with the floor and is not twisted.  Keeping your trunk steady, lift your right hand no higher than your shoulder and then your left leg no higher than your hip. Make sure you are not holding your breath. Hold this position for 15-20 seconds.  Continuing to keep your abdominal muscles tense and your back steady, slowly return to your starting position. Repeat with the opposite arm and leg. Repeat 2 times. Complete this exercise once every 1-2 days.   STRENGTHENING - Abdominals and Quadriceps, Straight Leg Raise   Lie on a firm bed or floor with both legs extended in front of you.  Keeping one leg in contact with the floor, bend the other knee so that your foot can rest flat on the floor.  Find your neutral spine, and tense your abdominal muscles to maintain your spinal position throughout the exercise.  Slowly lift your straight leg off the floor about 6  inches for a count of 15, making sure to not hold your breath.  Still keeping your neutral spine, slowly lower your leg all the way to the floor. Repeat this exercise with each leg 2 times. Complete this exercise once every 1-2 days. POSTURE AND BODY MECHANICS CONSIDERATIONS - Low Back Sprain Keeping correct posture when sitting, standing or completing your activities will reduce the stress put on different body tissues, allowing injured tissues a chance to heal and limiting painful experiences.  The following are general guidelines for improved posture. Your physician or physical therapist will provide you with any instructions specific to your needs. While reading these guidelines, remember:  The exercises prescribed by your provider will help you have the flexibility and strength to maintain correct postures.  The correct posture provides the best environment for your joints to work. All of your joints have less wear and tear when properly supported by a spine with good posture. This means you will experience a healthier, less painful body.  Correct posture must be practiced with all of your activities, especially prolonged sitting and standing. Correct posture is as important when doing repetitive low-stress activities (typing) as it is when doing a single heavy-load activity (lifting).  RESTING POSITIONS Consider which positions are most painful for you when choosing a resting position. If you have pain with flexion-based activities (sitting, bending, stooping, squatting), choose a position that allows you to rest in a less flexed posture. You would want to avoid curling into a fetal position on your side. If your pain worsens with extension-based activities (prolonged standing, working overhead), avoid resting in an extended position such as sleeping on your stomach. Most people will find more comfort when they rest with their spine in a more neutral position, neither too rounded nor too arched.  Lying on a non-sagging bed on your side with a pillow between your knees, or on your back with a pillow under your knees will often provide some relief. Keep in mind, being in any one position for a prolonged period of time, no matter how correct your posture, can still lead to stiffness. PROPER SITTING POSTURE In order to minimize stress and discomfort on your spine, you must sit with correct posture. Sitting with good posture should be effortless for a healthy body. Returning to good posture is a gradual process. Many people can work toward this most comfortably by using various supports until they have the flexibility and strength to maintain this posture on their own. When sitting with proper posture, your ears will fall over your shoulders and your shoulders will fall over your hips. You should use the back of the chair to support your upper back. Your lower back will be in a neutral position, just slightly arched. You may place a small pillow or folded towel at the base of your lower back for  support.  When working at a desk, create an environment that supports good, upright posture. Without extra support, muscles tire, which leads to excessive strain on joints and other tissues. Keep these recommendations in mind:  CHAIR:  A chair should be able to slide under your desk when your back makes contact with the back of the chair. This allows you to work closely.  The chair's height should allow your eyes to be level with the upper part of your monitor and your hands to be slightly lower than your elbows.  BODY POSITION  Your feet should make contact with the floor. If this is not possible, use a foot rest.  Keep your ears over your shoulders. This will reduce stress on your neck and low back.  INCORRECT SITTING POSTURES  If you are feeling tired and unable to assume a healthy sitting posture, do not slouch or slump. This puts excessive strain on your back tissues, causing more damage and  pain. Healthier options include:  Using more support, like a lumbar pillow.  Switching tasks to something that requires you to be upright or walking.  Talking  a brief walk.  Lying down to rest in a neutral-spine position.  PROLONGED STANDING WHILE SLIGHTLY LEANING FORWARD  When completing a task that requires you to lean forward while standing in one place for a long time, place either foot up on a stationary 2-4 inch high object to help maintain the best posture. When both feet are on the ground, the lower back tends to lose its slight inward curve. If this curve flattens (or becomes too large), then the back and your other joints will experience too much stress, tire more quickly, and can cause pain.  CORRECT STANDING POSTURES Proper standing posture should be assumed with all daily activities, even if they only take a few moments, like when brushing your teeth. As in sitting, your ears should fall over your shoulders and your shoulders should fall over your hips. You should keep a slight tension in your abdominal muscles to brace your spine. Your tailbone should point down to the ground, not behind your body, resulting in an over-extended swayback posture.   INCORRECT STANDING POSTURES  Common incorrect standing postures include a forward head, locked knees and/or an excessive swayback. WALKING Walk with an upright posture. Your ears, shoulders and hips should all line-up.  PROLONGED ACTIVITY IN A FLEXED POSITION When completing a task that requires you to bend forward at your waist or lean over a low surface, try to find a way to stabilize 3 out of 4 of your limbs. You can place a hand or elbow on your thigh or rest a knee on the surface you are reaching across. This will provide you more stability, so that your muscles do not tire as quickly. By keeping your knees relaxed, or slightly bent, you will also reduce stress across your lower back. CORRECT LIFTING TECHNIQUES  DO :  Assume a  wide stance. This will provide you more stability and the opportunity to get as close as possible to the object which you are lifting.  Tense your abdominals to brace your spine. Bend at the knees and hips. Keeping your back locked in a neutral-spine position, lift using your leg muscles. Lift with your legs, keeping your back straight.  Test the weight of unknown objects before attempting to lift them.  Try to keep your elbows locked down at your sides in order get the best strength from your shoulders when carrying an object.     Always ask for help when lifting heavy or awkward objects. INCORRECT LIFTING TECHNIQUES DO NOT:   Lock your knees when lifting, even if it is a small object.  Bend and twist. Pivot at your feet or move your feet when needing to change directions.  Assume that you can safely pick up even a paperclip without proper posture.

## 2016-08-18 ENCOUNTER — Ambulatory Visit: Payer: Medicare HMO | Admitting: Pulmonary Disease

## 2016-08-20 DIAGNOSIS — G4733 Obstructive sleep apnea (adult) (pediatric): Secondary | ICD-10-CM | POA: Diagnosis not present

## 2016-08-28 DIAGNOSIS — J45909 Unspecified asthma, uncomplicated: Secondary | ICD-10-CM | POA: Diagnosis not present

## 2016-09-07 ENCOUNTER — Encounter: Payer: Medicare HMO | Admitting: Family Medicine

## 2016-09-28 DIAGNOSIS — J45909 Unspecified asthma, uncomplicated: Secondary | ICD-10-CM | POA: Diagnosis not present

## 2016-09-29 DIAGNOSIS — H11153 Pinguecula, bilateral: Secondary | ICD-10-CM | POA: Diagnosis not present

## 2016-09-29 DIAGNOSIS — H16143 Punctate keratitis, bilateral: Secondary | ICD-10-CM | POA: Diagnosis not present

## 2016-09-29 DIAGNOSIS — H11423 Conjunctival edema, bilateral: Secondary | ICD-10-CM | POA: Diagnosis not present

## 2016-09-29 DIAGNOSIS — G4733 Obstructive sleep apnea (adult) (pediatric): Secondary | ICD-10-CM | POA: Diagnosis not present

## 2016-09-29 DIAGNOSIS — Z961 Presence of intraocular lens: Secondary | ICD-10-CM | POA: Diagnosis not present

## 2016-09-29 DIAGNOSIS — I10 Essential (primary) hypertension: Secondary | ICD-10-CM | POA: Diagnosis not present

## 2016-09-29 DIAGNOSIS — Z9849 Cataract extraction status, unspecified eye: Secondary | ICD-10-CM | POA: Diagnosis not present

## 2016-09-29 DIAGNOSIS — H18413 Arcus senilis, bilateral: Secondary | ICD-10-CM | POA: Diagnosis not present

## 2016-09-29 DIAGNOSIS — H04123 Dry eye syndrome of bilateral lacrimal glands: Secondary | ICD-10-CM | POA: Diagnosis not present

## 2016-09-29 DIAGNOSIS — H1131 Conjunctival hemorrhage, right eye: Secondary | ICD-10-CM | POA: Diagnosis not present

## 2016-10-07 ENCOUNTER — Ambulatory Visit (INDEPENDENT_AMBULATORY_CARE_PROVIDER_SITE_OTHER): Payer: Medicare HMO | Admitting: Family

## 2016-10-07 ENCOUNTER — Encounter: Payer: Self-pay | Admitting: Family

## 2016-10-07 ENCOUNTER — Other Ambulatory Visit (HOSPITAL_COMMUNITY)
Admission: RE | Admit: 2016-10-07 | Discharge: 2016-10-07 | Disposition: A | Discharge status: Home / Self Care | Payer: Medicare HMO | Source: Ambulatory Visit | Attending: Family | Admitting: Family

## 2016-10-07 VITALS — BP 138/86 | HR 67 | Temp 97.4°F | Resp 16 | Ht 63.0 in | Wt 252.6 lb

## 2016-10-07 DIAGNOSIS — Z114 Encounter for screening for human immunodeficiency virus [HIV]: Secondary | ICD-10-CM

## 2016-10-07 DIAGNOSIS — A609 Anogenital herpesviral infection, unspecified: Secondary | ICD-10-CM

## 2016-10-07 DIAGNOSIS — B009 Herpesviral infection, unspecified: Secondary | ICD-10-CM | POA: Diagnosis not present

## 2016-10-07 DIAGNOSIS — N898 Other specified noninflammatory disorders of vagina: Secondary | ICD-10-CM

## 2016-10-07 DIAGNOSIS — J45909 Unspecified asthma, uncomplicated: Secondary | ICD-10-CM

## 2016-10-07 DIAGNOSIS — Z113 Encounter for screening for infections with a predominantly sexual mode of transmission: Secondary | ICD-10-CM

## 2016-10-07 DIAGNOSIS — R69 Illness, unspecified: Secondary | ICD-10-CM | POA: Diagnosis not present

## 2016-10-07 DIAGNOSIS — J45901 Unspecified asthma with (acute) exacerbation: Secondary | ICD-10-CM

## 2016-10-07 DIAGNOSIS — J209 Acute bronchitis, unspecified: Secondary | ICD-10-CM

## 2016-10-07 MED ORDER — ALBUTEROL SULFATE (2.5 MG/3ML) 0.083% IN NEBU: INHALATION_SOLUTION | RESPIRATORY_TRACT | 0 refills | Status: DC

## 2016-10-07 MED ORDER — VALACYCLOVIR HCL 1 G PO TABS: ORAL_TABLET | ORAL | 0 refills | Status: DC

## 2016-10-07 NOTE — Patient Instructions (Signed)
Please complete lab work prior to leaving. Begin valtrex once daily for 5 days. We will contact you with your results.

## 2016-10-07 NOTE — Addendum Note (Signed)
Addended by: Kelle Darting A on: 10/07/2016 09:11 AM   Modules accepted: Orders

## 2016-10-07 NOTE — Progress Notes (Signed)
Subjective:    Patient ID: Molly Klein, female    DOB: 09/07/1960, 56 y.o.   MRN: [id number]  HPI   Molly Klein is a 56 yr old female who presents today with chief complaint of vaginal discharge.  Reports vaginal burning. Denies dysuria.  Reports that she has been out of valtrex.  Symptoms began 1 week ago. Started after she had intercourse with her husband.  Reports that she "does not trust my husband."    Asthma- reports that she uses albuterol several times a week. Worsened by exposure to cigarette smoke.   Review of Systems    see HPI  Past Medical History:  Diagnosis Date  . Asthma   . Bruising 10/21/2012  . DVT (deep venous thrombosis) ([place])   . GERD (gastroesophageal reflux disease)   . Hepatitis C   . History of DVT of lower extremity 10/21/2012  . Knee pain    left   . Neuromuscular disorder ([facility])    neuropathy lt arm  . Nipple discharge    right breast  . OSA (obstructive sleep apnea)      Social History   Social History  . Marital status: Married    Spouse name: N/A  . Number of children: N/A  . Years of education: N/A   Occupational History  . retired Disabled   Social History Main Topics  . Smoking status: Former Smoker    Years: 5.00    Types: Cigarettes    Quit date: 02/03/1975  . Smokeless tobacco: Never Used     Comment: 1 pack per week  . Alcohol use No  . Drug use: No  . Sexual activity: Yes    Partners: Male   Other Topics Concern  . Not on file   Social History Narrative   Exercise-- 2-3 days a week    Past Surgical History:  Procedure Laterality Date  . ABDOMINAL HYSTERECTOMY    . BREAST BIOPSY  02/06/2011   Procedure: BREAST BIOPSY;  Surgeon: Earnstine Regal, MD;  Location: [place];  Service: General;  Laterality: Right;  right breast biopsy  . CHOLECYSTECTOMY    . DILATION AND CURETTAGE OF UTERUS    . FRACTURE SURGERY     fx lt arm  . Nipple repair    . PILONIDAL CYST EXCISION    . right knee  surgery    . TONSILLECTOMY      Family History  Problem Relation Age of Onset  . Rectal cancer Maternal Grandmother   . Cancer Sister        pt unaware of what kind  . Cancer Maternal Grandmother        breast  . Heart attack Mother        mi in 76s  . Alcohol abuse Mother        cirrhosis of liver  . Heart attack Father        MI in 60s    Allergies  Allergen Reactions  . Codeine Nausea And Vomiting  . Interferon Beta-1a   . Sulfonamide Derivatives Nausea Only    Current Outpatient Prescriptions on File Prior to Visit  Medication Sig Dispense Refill  . albuterol (PROVENTIL HFA;VENTOLIN HFA) 108 (90 Base) MCG/ACT inhaler Inhale 2 puffs into the lungs 4 (four) times daily as needed. Shortness of breath 1 Inhaler 6  . albuterol (PROVENTIL) (2.5 MG/3ML) 0.083% nebulizer solution INHALE 1 VIAL BY NEBULIZER AS DIRECTED 75 mL 0  . BESIVANCE 0.6 %  SUSP     . budesonide-formoterol (SYMBICORT) 160-4.5 MCG/ACT inhaler Inhale 2 puffs into the lungs 2 (two) times daily. 1 Inhaler 3  . DUREZOL 0.05 % EMUL     . gabapentin (NEURONTIN) 300 MG capsule Take 1 capsule (300 mg total) by mouth every evening. 90 capsule 0  . levocetirizine (XYZAL) 5 MG tablet Take 1 tablet (5 mg total) by mouth every evening. 30 tablet 5  . PROLENSA 0.07 % SOLN     . ranitidine (ZANTAC) 150 MG tablet TAKE 1 TABLET(150 MG) BY MOUTH TWICE DAILY 60 tablet 0  . valACYclovir (VALTREX) 1000 MG tablet Take 1 tablet (1,000 mg total) by mouth 3 (three) times daily. 30 tablet 0   No current facility-administered medications on file prior to visit.     BP 138/86 (BP Location: Right Arm, Cuff Size: Large)   Pulse 67   Temp (!) 97.4 F (36.3 C) (Oral)   Resp 16   Ht 5\' 3"  (1.6 m)   Wt 252 lb 9.6 oz (114.6 kg)   SpO2 97%   BMI 44.75 kg/m    Objective:   Physical Exam  Constitutional: She appears well-developed and well-nourished.  Cardiovascular: Normal rate, regular rhythm and normal heart sounds.   No murmur  heard. Pulmonary/Chest: Effort normal and breath sounds normal. No respiratory distress. She has no wheezes.  Genitourinary:  Genitourinary Comments: Some white vaginal discharge. Some tenderness left perineal area, no obvious herpes lesions.   Psychiatric: She has a normal mood and affect. Her behavior is normal. Judgment and thought content normal.          Assessment & Plan:  Vaginal discharge- swab obtained today- will be sent for BV, yeast, trich, GC/Chlamydia. She also would like testing for HIV and syphilis. Labs ordered. Discussed importance of condom use.  Asthma- stable. Continue prn albuterol- refill provided.   HSV- I suspect she may be starting with a breakout in the perineal area. Advised pt to begin valtrex.

## 2016-10-08 LAB — CERVICOVAGINAL ANCILLARY ONLY
BACTERIAL VAGINITIS: NEGATIVE
CANDIDA VAGINITIS: NEGATIVE
CHLAMYDIA, DNA PROBE: NEGATIVE
Neisseria Gonorrhea: NEGATIVE
Trichomonas: NEGATIVE

## 2016-10-08 LAB — RPR: RPR Ser Ql: NONREACTIVE

## 2016-10-08 LAB — HIV ANTIBODY (ROUTINE TESTING W REFLEX): HIV: NONREACTIVE

## 2016-10-29 DIAGNOSIS — J45909 Unspecified asthma, uncomplicated: Secondary | ICD-10-CM | POA: Diagnosis not present

## 2016-11-04 DIAGNOSIS — L03115 Cellulitis of right lower limb: Secondary | ICD-10-CM | POA: Diagnosis not present

## 2016-11-28 DIAGNOSIS — J45909 Unspecified asthma, uncomplicated: Secondary | ICD-10-CM | POA: Diagnosis not present

## 2016-12-21 ENCOUNTER — Other Ambulatory Visit: Payer: Self-pay | Admitting: Family Medicine

## 2016-12-29 DIAGNOSIS — J45909 Unspecified asthma, uncomplicated: Secondary | ICD-10-CM | POA: Diagnosis not present

## 2017-01-05 DIAGNOSIS — G4733 Obstructive sleep apnea (adult) (pediatric): Secondary | ICD-10-CM | POA: Diagnosis not present

## 2017-01-28 DIAGNOSIS — J45909 Unspecified asthma, uncomplicated: Secondary | ICD-10-CM | POA: Diagnosis not present

## 2017-02-28 DIAGNOSIS — J45909 Unspecified asthma, uncomplicated: Secondary | ICD-10-CM | POA: Diagnosis not present

## 2017-03-09 DIAGNOSIS — J069 Acute upper respiratory infection, unspecified: Secondary | ICD-10-CM | POA: Diagnosis not present

## 2017-03-31 DIAGNOSIS — J45909 Unspecified asthma, uncomplicated: Secondary | ICD-10-CM | POA: Diagnosis not present

## 2017-04-05 DIAGNOSIS — H524 Presbyopia: Secondary | ICD-10-CM | POA: Diagnosis not present

## 2017-04-05 DIAGNOSIS — H18413 Arcus senilis, bilateral: Secondary | ICD-10-CM | POA: Diagnosis not present

## 2017-04-05 DIAGNOSIS — H40023 Open angle with borderline findings, high risk, bilateral: Secondary | ICD-10-CM | POA: Diagnosis not present

## 2017-04-05 DIAGNOSIS — H04123 Dry eye syndrome of bilateral lacrimal glands: Secondary | ICD-10-CM | POA: Diagnosis not present

## 2017-04-05 DIAGNOSIS — H52223 Regular astigmatism, bilateral: Secondary | ICD-10-CM | POA: Diagnosis not present

## 2017-04-05 DIAGNOSIS — H11423 Conjunctival edema, bilateral: Secondary | ICD-10-CM | POA: Diagnosis not present

## 2017-04-05 DIAGNOSIS — Z961 Presence of intraocular lens: Secondary | ICD-10-CM | POA: Diagnosis not present

## 2017-04-07 DIAGNOSIS — G4733 Obstructive sleep apnea (adult) (pediatric): Secondary | ICD-10-CM | POA: Diagnosis not present

## 2017-04-28 DIAGNOSIS — J45909 Unspecified asthma, uncomplicated: Secondary | ICD-10-CM | POA: Diagnosis not present

## 2017-05-29 DIAGNOSIS — J45909 Unspecified asthma, uncomplicated: Secondary | ICD-10-CM | POA: Diagnosis not present

## 2017-06-28 DIAGNOSIS — J45909 Unspecified asthma, uncomplicated: Secondary | ICD-10-CM | POA: Diagnosis not present

## 2017-07-12 DIAGNOSIS — G4733 Obstructive sleep apnea (adult) (pediatric): Secondary | ICD-10-CM | POA: Diagnosis not present

## 2017-07-14 DIAGNOSIS — H40003 Preglaucoma, unspecified, bilateral: Secondary | ICD-10-CM | POA: Diagnosis not present

## 2017-07-14 DIAGNOSIS — H1851 Endothelial corneal dystrophy: Secondary | ICD-10-CM | POA: Diagnosis not present

## 2017-07-14 DIAGNOSIS — H18413 Arcus senilis, bilateral: Secondary | ICD-10-CM | POA: Diagnosis not present

## 2017-07-14 DIAGNOSIS — H11153 Pinguecula, bilateral: Secondary | ICD-10-CM | POA: Diagnosis not present

## 2017-07-14 DIAGNOSIS — H11423 Conjunctival edema, bilateral: Secondary | ICD-10-CM | POA: Diagnosis not present

## 2017-07-14 DIAGNOSIS — H04123 Dry eye syndrome of bilateral lacrimal glands: Secondary | ICD-10-CM | POA: Diagnosis not present

## 2017-07-14 DIAGNOSIS — H0100B Unspecified blepharitis left eye, upper and lower eyelids: Secondary | ICD-10-CM | POA: Diagnosis not present

## 2017-07-14 DIAGNOSIS — H0100A Unspecified blepharitis right eye, upper and lower eyelids: Secondary | ICD-10-CM | POA: Diagnosis not present

## 2017-07-14 DIAGNOSIS — H40023 Open angle with borderline findings, high risk, bilateral: Secondary | ICD-10-CM | POA: Diagnosis not present

## 2017-07-14 DIAGNOSIS — Z961 Presence of intraocular lens: Secondary | ICD-10-CM | POA: Diagnosis not present

## 2017-07-29 DIAGNOSIS — J45909 Unspecified asthma, uncomplicated: Secondary | ICD-10-CM | POA: Diagnosis not present

## 2017-08-28 DIAGNOSIS — J45909 Unspecified asthma, uncomplicated: Secondary | ICD-10-CM | POA: Diagnosis not present

## 2017-09-15 ENCOUNTER — Telehealth: Payer: Self-pay | Admitting: Family Medicine

## 2017-09-15 NOTE — Telephone Encounter (Signed)
Completed provider information, patient will need to print & sign name in Applicant Section; forwarded to provider/SLS 08/14

## 2017-09-15 NOTE — Telephone Encounter (Signed)
Patient dropping off handicap placard to be filled out from Dr. Etter Sjogren. Advised patient that she is returning from [place] this week & to give 5-7 business days- will call mid week next week to check on status. Form placed in provider tray up front.

## 2017-09-16 NOTE — Telephone Encounter (Signed)
Patient informed form is ready for p/u during regular business hours and she will need to sign before we make our copy for scan; understood & agreed/SLS 08/15

## 2017-09-28 DIAGNOSIS — J45909 Unspecified asthma, uncomplicated: Secondary | ICD-10-CM | POA: Diagnosis not present

## 2017-10-11 ENCOUNTER — Ambulatory Visit: Payer: Medicare HMO | Admitting: Family Medicine

## 2017-10-11 ENCOUNTER — Encounter: Payer: Self-pay | Admitting: Family Medicine

## 2017-10-11 ENCOUNTER — Other Ambulatory Visit (HOSPITAL_COMMUNITY)
Admission: RE | Admit: 2017-10-11 | Discharge: 2017-10-11 | Disposition: A | Discharge status: Home / Self Care | Payer: Medicare HMO | Source: Ambulatory Visit | Attending: Family Medicine | Admitting: Family Medicine

## 2017-10-11 VITALS — BP 120/67 | HR 71 | Temp 97.8°F | Resp 16 | Ht 63.0 in | Wt 251.0 lb

## 2017-10-11 DIAGNOSIS — N760 Acute vaginitis: Secondary | ICD-10-CM

## 2017-10-11 DIAGNOSIS — Z8619 Personal history of other infectious and parasitic diseases: Secondary | ICD-10-CM

## 2017-10-11 MED ORDER — VALACYCLOVIR HCL 1 G PO TABS: ORAL_TABLET | ORAL | 5 refills | Status: DC

## 2017-10-11 MED ORDER — METRONIDAZOLE 500 MG PO TABS: 500.0000 mg | ORAL_TABLET | Freq: Two times a day (BID) | ORAL | 0 refills | Status: DC

## 2017-10-11 NOTE — Patient Instructions (Signed)
Bacterial Vaginosis Bacterial vaginosis is a vaginal infection that occurs when the normal balance of bacteria in the vagina is disrupted. It results from an overgrowth of certain bacteria. This is the most common vaginal infection among women ages 15-44. Because bacterial vaginosis increases your risk for STIs (sexually transmitted infections), getting treated can help reduce your risk for chlamydia, gonorrhea, herpes, and HIV (human immunodeficiency virus). Treatment is also important for preventing complications in pregnant women, because this condition can cause an early (premature) delivery. What are the causes? This condition is caused by an increase in harmful bacteria that are normally present in small amounts in the vagina. However, the reason that the condition develops is not fully understood. What increases the risk? The following factors may make you more likely to develop this condition:  Having a new sexual partner or multiple sexual partners.  Having unprotected sex.  Douching.  Having an intrauterine device (IUD).  Smoking.  Drug and alcohol abuse.  Taking certain antibiotic medicines.  Being pregnant.  You cannot get bacterial vaginosis from toilet seats, bedding, swimming pools, or contact with objects around you. What are the signs or symptoms? Symptoms of this condition include:  Grey or white vaginal discharge. The discharge can also be watery or foamy.  A fish-like odor with discharge, especially after sexual intercourse or during menstruation.  Itching in and around the vagina.  Burning or pain with urination.  Some women with bacterial vaginosis have no signs or symptoms. How is this diagnosed? This condition is diagnosed based on:  Your medical history.  A physical exam of the vagina.  Testing a sample of vaginal fluid under a microscope to look for a large amount of bad bacteria or abnormal cells. Your health care provider may use a cotton swab  or a small wooden spatula to collect the sample.  How is this treated? This condition is treated with antibiotics. These may be given as a pill, a vaginal cream, or a medicine that is put into the vagina (suppository). If the condition comes back after treatment, a second round of antibiotics may be needed. Follow these instructions at home: Medicines  Take over-the-counter and prescription medicines only as told by your health care provider.  Take or use your antibiotic as told by your health care provider. Do not stop taking or using the antibiotic even if you start to feel better. General instructions  If you have a female sexual partner, tell her that you have a vaginal infection. She should see her health care provider and be treated if she has symptoms. If you have a female sexual partner, he does not need treatment.  During treatment: ? Avoid sexual activity until you finish treatment. ? Do not douche. ? Avoid alcohol as directed by your health care provider. ? Avoid breastfeeding as directed by your health care provider.  Drink enough water and fluids to keep your urine clear or pale yellow.  Keep the area around your vagina and rectum clean. ? Wash the area daily with warm water. ? Wipe yourself from front to back after using the toilet.  Keep all follow-up visits as told by your health care provider. This is important. How is this prevented?  Do not douche.  Wash the outside of your vagina with warm water only.  Use protection when having sex. This includes latex condoms and dental dams.  Limit how many sexual partners you have. To help prevent bacterial vaginosis, it is best to have sex with just   one partner (monogamous).  Make sure you and your sexual partner are tested for STIs.  Wear cotton or cotton-lined underwear.  Avoid wearing tight pants and pantyhose, especially during summer.  Limit the amount of alcohol that you drink.  Do not use any products that  contain nicotine or tobacco, such as cigarettes and e-cigarettes. If you need help quitting, ask your health care provider.  Do not use illegal drugs. Where to find more information:  Centers for Disease Control and Prevention: www.cdc.gov/std  American Sexual Health Association (ASHA): www.ashastd.org  U.S. Department of Health and Human Services, Office on Women's Health: www.womenshealth.gov/ or https://www.womenshealth.gov/a-z-topics/bacterial-vaginosis Contact a health care provider if:  Your symptoms do not improve, even after treatment.  You have more discharge or pain when urinating.  You have a fever.  You have pain in your abdomen.  You have pain during sex.  You have vaginal bleeding between periods. Summary  Bacterial vaginosis is a vaginal infection that occurs when the normal balance of bacteria in the vagina is disrupted.  Because bacterial vaginosis increases your risk for STIs (sexually transmitted infections), getting treated can help reduce your risk for chlamydia, gonorrhea, herpes, and HIV (human immunodeficiency virus). Treatment is also important for preventing complications in pregnant women, because the condition can cause an early (premature) delivery.  This condition is treated with antibiotic medicines. These may be given as a pill, a vaginal cream, or a medicine that is put into the vagina (suppository). This information is not intended to replace advice given to you by your health care provider. Make sure you discuss any questions you have with your health care provider. Document Released: 01/19/2005 Document Revised: 05/25/2016 Document Reviewed: 10/05/2015 Elsevier Interactive Patient Education  2018 [employer] Inc.  

## 2017-10-11 NOTE — Progress Notes (Signed)
Patient ID: Molly Klein, female    DOB: 1960-07-20  Age: 57 y.o. MRN: [id number]    Subjective:  Subjective  HPI Molly Klein presents for c/o vaginal d/c and odor.  Pt husband is cheating on her.  She also currently is having a herpes outbreak and is out of her valtrex.    Review of Systems  Constitutional: Negative for chills and fever.  HENT: Negative for congestion and hearing loss.   Eyes: Negative for discharge.  Respiratory: Negative for cough and shortness of breath.   Cardiovascular: Negative for chest pain, palpitations and leg swelling.  Gastrointestinal: Negative for abdominal pain, blood in stool, constipation, diarrhea, nausea and vomiting.  Genitourinary: Positive for genital sores and vaginal discharge. Negative for difficulty urinating, dyspareunia, dysuria, frequency, hematuria and urgency.  Musculoskeletal: Negative for back pain and myalgias.  Skin: Negative for rash.  Allergic/Immunologic: Negative for environmental allergies.  Neurological: Negative for dizziness, weakness and headaches.  Hematological: Does not bruise/bleed easily.  Psychiatric/Behavioral: Negative for suicidal ideas. The patient is not nervous/anxious.     History Past Medical History:  Diagnosis Date  . Asthma   . Bruising 10/21/2012  . DVT (deep venous thrombosis) ([place])   . GERD (gastroesophageal reflux disease)   . Hepatitis C   . History of DVT of lower extremity 10/21/2012  . Knee pain    left   . Neuromuscular disorder ([facility])    neuropathy lt arm  . Nipple discharge    right breast  . OSA (obstructive sleep apnea)     She has a past surgical history that includes Cholecystectomy; Pilonidal cyst excision; Dilation and curettage of uterus; Tonsillectomy; Abdominal hysterectomy; Fracture surgery; Breast biopsy (02/06/2011); right knee surgery; and Nipple repair.   Her family history includes Alcohol abuse in her mother; Cancer in her maternal grandmother and sister; Heart  attack in her father and mother; Rectal cancer in her maternal grandmother.She reports that she quit smoking about 42 years ago. Her smoking use included cigarettes. She quit after 5.00 years of use. She has never used smokeless tobacco. She reports that she does not drink alcohol or use drugs.  Current Outpatient Medications on File Prior to Visit  Medication Sig Dispense Refill  . albuterol (PROVENTIL HFA;VENTOLIN HFA) 108 (90 Base) MCG/ACT inhaler Inhale 2 puffs into the lungs 4 (four) times daily as needed. Shortness of breath 1 Inhaler 6  . albuterol (PROVENTIL) (2.5 MG/3ML) 0.083% nebulizer solution INHALE 1 VIAL BY NEBULIZER AS DIRECTED 75 mL 0  . BESIVANCE 0.6 % SUSP     . budesonide-formoterol (SYMBICORT) 160-4.5 MCG/ACT inhaler Inhale 2 puffs into the lungs 2 (two) times daily. 1 Inhaler 3  . DUREZOL 0.05 % EMUL     . gabapentin (NEURONTIN) 300 MG capsule Take 1 capsule (300 mg total) by mouth every evening. 90 capsule 0  . levocetirizine (XYZAL) 5 MG tablet Take 1 tablet (5 mg total) by mouth every evening. 30 tablet 5  . PROLENSA 0.07 % SOLN     . ranitidine (ZANTAC) 150 MG tablet TAKE 1 TABLET(150 MG) BY MOUTH TWICE DAILY 60 tablet 0  . ranitidine (ZANTAC) 150 MG tablet TAKE 1 TABLET BY MOUTH TWICE DAILY 180 tablet 0   No current facility-administered medications on file prior to visit.      Objective:  Objective  Physical Exam  Constitutional: She is oriented to person, place, and time. She appears well-developed and well-nourished.  HENT:  Head: Normocephalic and atraumatic.  Eyes: Conjunctivae  and EOM are normal.  Neck: Normal range of motion. Neck supple. No JVD present. Carotid bruit is not present. No thyromegaly present.  Cardiovascular: Normal rate, regular rhythm and normal heart sounds.  No murmur heard. Pulmonary/Chest: Effort normal and breath sounds normal. No respiratory distress. She has no wheezes. She has no rales. She exhibits no tenderness.  Genitourinary:     Pelvic exam was performed with patient supine. There is tenderness and lesion on the right labia. There is tenderness and lesion on the left labia. Cervix exhibits no motion tenderness. Vaginal discharge found.    Musculoskeletal: She exhibits no edema.  Neurological: She is alert and oriented to person, place, and time.  Psychiatric: She has a normal mood and affect.  Nursing note and vitals reviewed.  BP 120/67 (BP Location: Left Arm, Cuff Size: Large)   Pulse 71   Temp 97.8 F (36.6 C) (Oral)   Resp 16   Ht 5\' 3"  (1.6 m)   Wt 251 lb (113.9 kg)   SpO2 99%   BMI 44.46 kg/m  Wt Readings from Last 3 Encounters:  10/11/17 251 lb (113.9 kg)  10/07/16 252 lb 9.6 oz (114.6 kg)  07/31/16 255 lb 6.4 oz (115.8 kg)     Lab Results  Component Value Date   WBC 4.2 09/05/2015   HGB 14.8 09/05/2015   HCT 43.7 09/05/2015   PLT 210.0 09/05/2015   GLUCOSE 93 09/05/2015   CHOL 170 09/05/2015   TRIG 60.0 09/05/2015   HDL 54.40 09/05/2015   LDLCALC 103 (H) 09/05/2015   ALT 24 09/05/2015   AST 18 09/05/2015   NA 140 09/05/2015   K 4.4 09/05/2015   CL 104 09/05/2015   CREATININE 0.72 09/05/2015   BUN 15 09/05/2015   CO2 30 09/05/2015   TSH 1.65 09/05/2015   INR 1.04 02/11/2010   HGBA1C 5.5 07/07/2011   MICROALBUR 1.1 09/05/2015    No results found.   Assessment & Plan:  Plan  I have changed Molly Klein's valACYclovir. I am also having her start on metroNIDAZOLE. Additionally, I am having her maintain her gabapentin, ranitidine, BESIVANCE, PROLENSA, DUREZOL, levocetirizine, albuterol, budesonide-formoterol, albuterol, and ranitidine.  Meds ordered this encounter  Medications  . valACYclovir (VALTREX) 1000 MG tablet    Sig: 1 tablet by mouth tid x 7 days then 1 po qd    Dispense:  42 tablet    Refill:  5  . metroNIDAZOLE (FLAGYL) 500 MG tablet    Sig: Take 1 tablet (500 mg total) by mouth 2 (two) times daily.    Dispense:  14 tablet    Refill:  0    Problem  List Items Addressed This Visit    None    Visit Diagnoses    Acute vaginitis    -  Primary   Relevant Medications   metroNIDAZOLE (FLAGYL) 500 MG tablet   Other Relevant Orders   Cervicovaginal ancillary only   HIV antibody (with reflex)   RPR   History of herpes genitalis       Relevant Medications   valACYclovir (VALTREX) 1000 MG tablet      Follow-up: Return if symptoms worsen or fail to improve.  [name], DO

## 2017-10-12 LAB — CERVICOVAGINAL ANCILLARY ONLY
Bacterial vaginitis: POSITIVE — AB
CANDIDA VAGINITIS: NEGATIVE
CHLAMYDIA, DNA PROBE: NEGATIVE
Neisseria Gonorrhea: NEGATIVE
Trichomonas: POSITIVE — AB

## 2017-10-12 LAB — RPR: RPR Ser Ql: NONREACTIVE

## 2017-10-12 LAB — HIV ANTIBODY (ROUTINE TESTING W REFLEX): HIV 1&2 Ab, 4th Generation: NONREACTIVE

## 2017-10-25 ENCOUNTER — Encounter: Payer: Self-pay | Admitting: *Deleted

## 2017-10-29 DIAGNOSIS — J45909 Unspecified asthma, uncomplicated: Secondary | ICD-10-CM | POA: Diagnosis not present

## 2017-11-12 DIAGNOSIS — M546 Pain in thoracic spine: Secondary | ICD-10-CM | POA: Diagnosis not present

## 2017-11-12 DIAGNOSIS — M542 Cervicalgia: Secondary | ICD-10-CM | POA: Diagnosis not present

## 2017-11-12 DIAGNOSIS — S46912A Strain of unspecified muscle, fascia and tendon at shoulder and upper arm level, left arm, initial encounter: Secondary | ICD-10-CM | POA: Diagnosis not present

## 2017-11-12 DIAGNOSIS — S46911A Strain of unspecified muscle, fascia and tendon at shoulder and upper arm level, right arm, initial encounter: Secondary | ICD-10-CM | POA: Diagnosis not present

## 2017-11-17 DIAGNOSIS — G4733 Obstructive sleep apnea (adult) (pediatric): Secondary | ICD-10-CM | POA: Diagnosis not present

## 2017-11-28 DIAGNOSIS — J45909 Unspecified asthma, uncomplicated: Secondary | ICD-10-CM | POA: Diagnosis not present

## 2017-12-15 ENCOUNTER — Other Ambulatory Visit: Payer: Self-pay | Admitting: Chiropractic Medicine

## 2017-12-15 DIAGNOSIS — M25512 Pain in left shoulder: Secondary | ICD-10-CM

## 2017-12-15 DIAGNOSIS — M542 Cervicalgia: Secondary | ICD-10-CM

## 2017-12-22 DIAGNOSIS — L438 Other lichen planus: Secondary | ICD-10-CM | POA: Diagnosis not present

## 2017-12-26 ENCOUNTER — Other Ambulatory Visit: Payer: Medicare HMO

## 2017-12-29 DIAGNOSIS — J45909 Unspecified asthma, uncomplicated: Secondary | ICD-10-CM | POA: Diagnosis not present

## 2018-01-03 ENCOUNTER — Ambulatory Visit
Admission: RE | Admit: 2018-01-03 | Discharge: 2018-01-03 | Disposition: A | Discharge status: Home / Self Care | Payer: Self-pay | Source: Ambulatory Visit | Attending: Chiropractic Medicine | Admitting: Chiropractic Medicine

## 2018-01-03 ENCOUNTER — Ambulatory Visit
Admission: RE | Admit: 2018-01-03 | Discharge: 2018-01-03 | Disposition: A | Discharge status: Home / Self Care | Payer: Medicare HMO | Source: Ambulatory Visit | Attending: Chiropractic Medicine | Admitting: Chiropractic Medicine

## 2018-01-03 DIAGNOSIS — M542 Cervicalgia: Secondary | ICD-10-CM

## 2018-01-03 DIAGNOSIS — M79632 Pain in left forearm: Secondary | ICD-10-CM | POA: Diagnosis not present

## 2018-01-03 DIAGNOSIS — M50222 Other cervical disc displacement at C5-C6 level: Secondary | ICD-10-CM | POA: Diagnosis not present

## 2018-01-03 DIAGNOSIS — M25512 Pain in left shoulder: Secondary | ICD-10-CM

## 2018-01-10 ENCOUNTER — Ambulatory Visit (INDEPENDENT_AMBULATORY_CARE_PROVIDER_SITE_OTHER): Payer: Medicare HMO | Admitting: Orthopaedic Surgery

## 2018-01-10 ENCOUNTER — Encounter (INDEPENDENT_AMBULATORY_CARE_PROVIDER_SITE_OTHER): Payer: Self-pay | Admitting: Family Medicine

## 2018-01-10 ENCOUNTER — Ambulatory Visit (INDEPENDENT_AMBULATORY_CARE_PROVIDER_SITE_OTHER): Payer: Medicare HMO

## 2018-01-10 ENCOUNTER — Ambulatory Visit (INDEPENDENT_AMBULATORY_CARE_PROVIDER_SITE_OTHER): Payer: Medicare HMO | Admitting: Family Medicine

## 2018-01-10 ENCOUNTER — Encounter (INDEPENDENT_AMBULATORY_CARE_PROVIDER_SITE_OTHER): Payer: Self-pay | Admitting: Orthopaedic Surgery

## 2018-01-10 VITALS — BP 177/113 | HR 66 | Ht 63.0 in | Wt 242.0 lb

## 2018-01-10 DIAGNOSIS — M25512 Pain in left shoulder: Secondary | ICD-10-CM | POA: Diagnosis not present

## 2018-01-10 DIAGNOSIS — M5020 Other cervical disc displacement, unspecified cervical region: Secondary | ICD-10-CM | POA: Insufficient documentation

## 2018-01-10 DIAGNOSIS — M5022 Other cervical disc displacement, mid-cervical region, unspecified level: Secondary | ICD-10-CM

## 2018-01-10 DIAGNOSIS — M542 Cervicalgia: Secondary | ICD-10-CM

## 2018-01-10 DIAGNOSIS — M79602 Pain in left arm: Secondary | ICD-10-CM | POA: Diagnosis not present

## 2018-01-10 DIAGNOSIS — M47812 Spondylosis without myelopathy or radiculopathy, cervical region: Secondary | ICD-10-CM | POA: Diagnosis not present
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
## 2018-01-28 DIAGNOSIS — J45909 Unspecified asthma, uncomplicated: Secondary | ICD-10-CM | POA: Diagnosis not present

## 2018-02-09 ENCOUNTER — Encounter (INDEPENDENT_AMBULATORY_CARE_PROVIDER_SITE_OTHER): Payer: Self-pay | Admitting: Orthopaedic Surgery

## 2018-02-09 ENCOUNTER — Ambulatory Visit (INDEPENDENT_AMBULATORY_CARE_PROVIDER_SITE_OTHER): Payer: Medicare HMO | Admitting: Orthopaedic Surgery

## 2018-02-09 VITALS — BP 140/91 | HR 82 | Ht 63.0 in | Wt 242.0 lb

## 2018-02-09 DIAGNOSIS — M5020 Other cervical disc displacement, unspecified cervical region: Secondary | ICD-10-CM | POA: Diagnosis not present
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
## 2018-02-28 DIAGNOSIS — J45909 Unspecified asthma, uncomplicated: Secondary | ICD-10-CM | POA: Diagnosis not present

## 2018-03-10 ENCOUNTER — Telehealth (INDEPENDENT_AMBULATORY_CARE_PROVIDER_SITE_OTHER): Payer: Self-pay | Admitting: Orthopaedic Surgery

## 2018-03-10 NOTE — Telephone Encounter (Signed)
Please advise 

## 2018-03-10 NOTE — Telephone Encounter (Signed)
Pt called and is ready for C4-5 ACDF , blue sheet likely already done.  thanks

## 2018-03-10 NOTE — Telephone Encounter (Signed)
Pt called in said she is having severe pain on her neck and she went to get a massage and she said the massage therapist didn't want to do it due to her neck being swollen. So shes wondering what should she do, if the surgery should be moved up or make an appt to come in?  [phone]

## 2018-03-15 DIAGNOSIS — G4733 Obstructive sleep apnea (adult) (pediatric): Secondary | ICD-10-CM | POA: Diagnosis not present

## 2018-03-16 ENCOUNTER — Encounter: Payer: Self-pay | Admitting: Primary Care

## 2018-03-16 ENCOUNTER — Telehealth: Payer: Self-pay | Admitting: Primary Care

## 2018-03-16 ENCOUNTER — Ambulatory Visit (INDEPENDENT_AMBULATORY_CARE_PROVIDER_SITE_OTHER): Payer: Medicare HMO | Admitting: Primary Care

## 2018-03-16 ENCOUNTER — Telehealth: Payer: Self-pay | Admitting: Pulmonary Disease

## 2018-03-16 ENCOUNTER — Telehealth: Payer: Self-pay | Admitting: Family Medicine

## 2018-03-16 ENCOUNTER — Ambulatory Visit (INDEPENDENT_AMBULATORY_CARE_PROVIDER_SITE_OTHER)
Admission: RE | Admit: 2018-03-16 | Discharge: 2018-03-16 | Disposition: A | Discharge status: Home / Self Care | Payer: Medicare HMO | Source: Ambulatory Visit | Attending: Primary Care | Admitting: Primary Care

## 2018-03-16 VITALS — BP 146/80 | HR 68 | Temp 97.6°F | Ht 63.0 in | Wt 248.8 lb

## 2018-03-16 DIAGNOSIS — Z7189 Other specified counseling: Secondary | ICD-10-CM | POA: Diagnosis not present

## 2018-03-16 DIAGNOSIS — J45901 Unspecified asthma with (acute) exacerbation: Secondary | ICD-10-CM

## 2018-03-16 DIAGNOSIS — J4530 Mild persistent asthma, uncomplicated: Secondary | ICD-10-CM

## 2018-03-16 DIAGNOSIS — J209 Acute bronchitis, unspecified: Secondary | ICD-10-CM | POA: Diagnosis not present

## 2018-03-16 DIAGNOSIS — G4733 Obstructive sleep apnea (adult) (pediatric): Secondary | ICD-10-CM

## 2018-03-16 DIAGNOSIS — Z01818 Encounter for other preprocedural examination: Secondary | ICD-10-CM | POA: Insufficient documentation

## 2018-03-16 DIAGNOSIS — Z01811 Encounter for preprocedural respiratory examination: Secondary | ICD-10-CM | POA: Diagnosis not present

## 2018-03-16 MED ORDER — BUDESONIDE-FORMOTEROL FUMARATE 160-4.5 MCG/ACT IN AERO: 2.0000 | INHALATION_SPRAY | Freq: Two times a day (BID) | RESPIRATORY_TRACT | 6 refills | Status: DC
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
## 2018-03-16 NOTE — Telephone Encounter (Signed)
Received message from [facility] stating that pt has cpap with [facility].  Rodena Piety has contacted Huey Romans, who states that pt will need to bring her machine/SD card in for download, as her machine is old.  I have made pt aware of this information.  Pt states she will take cpap machine to [facility].  I have advised pt to contact our office once download as been obtained, so we can follow up with [facility].  Download will need to be obtained prior to surgical clearance.

## 2018-03-16 NOTE — Assessment & Plan Note (Addendum)
-   Patient reports compliance with CPAP use; pressure setting 16cm H20  - Reviewed SoClean use, youtube instructional video available  - Encourage continued use, goal 4-6 + hours every night - Advised patient not to drive if experiencing excessive daytime fatigue or somnolence - Needs SD card to review download

## 2018-03-16 NOTE — Telephone Encounter (Signed)
Patient has not been seen since 2017 and needs to be seen apt made for today. Nothing further needed at this time.

## 2018-03-16 NOTE — Assessment & Plan Note (Addendum)
-   Stable; exam and spirometry normal - CXR ordered re: cough - Recommend resuming daily Symbicort 160 2bid

## 2018-03-16 NOTE — Patient Instructions (Addendum)
  Ask orthopedics about disc fusion operation (what does the procedure entail, how long is recovery, will I have full range of motion)   Sleep apnea: Continue CPAP every night for 4-6 hours or more Do not drive if experiencing excessive daytime fatigue or somnolence Youtube - CPAP so clean machine instructions   Asthma: Resume Symbicort 2 puffs twice daily (refill sent) Albuterol rescue inhaler every 4-6 hours as needed for shortness of breath/wheezing  Orders: CXR today [facility]  Follow-up: 6-8 months with Dr. Halford Klein

## 2018-03-16 NOTE — Assessment & Plan Note (Addendum)
Awaiting CXR results and CPAP download. If CXR normal and it is shown that patient is compliant with CPAP use she can be cleared from a pulmonary standpoint for C4-5 disc fusion with Dr. Lorin Mercy in March  Major Pulmonary risks identified in the multifactorial risk analysis are but not limited to a) pneumonia; b) recurrent intubation risk; c) prolonged or recurrent acute respiratory failure needing mechanical ventilation; d) prolonged hospitalization; e) DVT/Pulmonary embolism; f) Acute Pulmonary edema  Recommend 1. Short duration of surgery and avoid paralytic if possible 2. Recovery in step down or ICU with Pulmonary consultation if warranted  3. Aggressive pulmonary toilet with O2, bronchodilatation, incentive spirometry and early ambulation 4. CPAP available for post-op use if needed  5. DVT prophylaxis

## 2018-03-16 NOTE — Telephone Encounter (Signed)
Pt dropped off document to be filled out by provider (Surgical Clearance - 1 page) Pt would like document to be faxed to [phone] when ready. Document put at front office tray under providers name.

## 2018-03-16 NOTE — Progress Notes (Signed)
Reviewed and agree with assessment/plan.   Rigley Niess, MD [facility] 01/29/2016, 12:24 PM Pager:  [phone]  

## 2018-03-17 ENCOUNTER — Telehealth (INDEPENDENT_AMBULATORY_CARE_PROVIDER_SITE_OTHER): Payer: Self-pay | Admitting: Orthopaedic Surgery

## 2018-03-17 DIAGNOSIS — Z7189 Other specified counseling: Secondary | ICD-10-CM | POA: Insufficient documentation

## 2018-03-17 NOTE — Telephone Encounter (Signed)
Patient came by office 03-16-18  to discuss date for surgery.  Patient given two clearance forms, one for PCP and one for Pulmonologist.  It's noted that the pulmonologist-Dr. Soot  is awaiting CXR results and CPAP download. If CXR normal and it is shown that patient is compliant with CPAP use she can be cleared from a pulmonary standpoint for C4-5 disc fusion with Dr. Lorin Mercy in March.  Patient is tentatively scheduled for her procedure on April 15, 2018 and the pre-op appointment with Benjiman Core, PA-C is scheduled Mar 31, 2018.  Surgery sheet is still needed, along with CPT codes. Patient was last seen March 08, 2022 and was instructed to follow up 6 weeks.  Will this appointment to see Dr. Lorin Mercy be necessary now that she has decided to proceed with the cervical fusion? (hoping to let patient know something on Monday).

## 2018-03-17 NOTE — Assessment & Plan Note (Signed)
-   Patient recently lost her husband, currently coping ok - Encouraged patient to consider grief counseling. Patient to discuss further with primary care

## 2018-03-18 ENCOUNTER — Telehealth: Payer: Self-pay | Admitting: Primary Care

## 2018-03-18 NOTE — Telephone Encounter (Signed)
Received Medical/Surgical Clearance Form from [facility], pt canceled 09/07/16 Physical appt [last CPE 09/05/15]; forwarded to provider/SLS 02/14

## 2018-03-18 NOTE — Telephone Encounter (Signed)
Rec'd the form from [place], placed in VS look at VS is out of the office until 03/22/2018, he will sign at that time Nothing further needed.  Routing to [place] for [employer]

## 2018-03-21 NOTE — Telephone Encounter (Signed)
Blue sheet done. She can see Jeneen Rinks pre-op as planned. thanks

## 2018-03-21 NOTE — Telephone Encounter (Signed)
Please advise 

## 2018-03-23 ENCOUNTER — Ambulatory Visit (INDEPENDENT_AMBULATORY_CARE_PROVIDER_SITE_OTHER): Payer: Medicare HMO | Admitting: Orthopaedic Surgery

## 2018-03-23 NOTE — Telephone Encounter (Signed)
Pt states that she took her Cpap to [place] but the Resp. Therapist was not there to read the SD card. Pt states that she can bring the SD card to this office to be read if the is an option. Cb is [phone]

## 2018-03-23 NOTE — Telephone Encounter (Signed)
Called and spoke with patient due to the machine being older we would needed the patient to get a download from DME so we sign off on surgery she will take it back when RT  is back in office.

## 2018-03-24 NOTE — Telephone Encounter (Signed)
The form was given to Rodena Piety in [facility]; she advised she will fax to [place] Nothing further needed.

## 2018-03-29 ENCOUNTER — Telehealth (INDEPENDENT_AMBULATORY_CARE_PROVIDER_SITE_OTHER): Payer: Self-pay

## 2018-03-29 DIAGNOSIS — M5020 Other cervical disc displacement, unspecified cervical region: Secondary | ICD-10-CM

## 2018-03-29 DIAGNOSIS — G4733 Obstructive sleep apnea (adult) (pediatric): Secondary | ICD-10-CM | POA: Diagnosis not present

## 2018-03-29 NOTE — Telephone Encounter (Signed)
I called patient and advised. Referral entered for PT at [facility]. Patient given address and phone number.  Patient would like to be sure that appointment with Jeneen Rinks for H&P and pre op appt for [facility] is cancelled.   Debbie-patient has appt with PCP this week to get medical clearance for surgery. She would like to know if she should cancel this or how long it will be good for prior to surgery. Please call patient to advise. I will cancel appt with Jeneen Rinks for 2/27 for H&P.    Follow up appt post PT is made for 05/03/2018. We will try and resubmit after this.

## 2018-03-29 NOTE — Telephone Encounter (Signed)
Please advise 

## 2018-03-29 NOTE — Telephone Encounter (Signed)
Received call from [facility] stating medical director has denied surgery due to lack of physical therapy. Peer to peer is an option and can be set up by calling [phone] (option 3) and will need to be done by the end of the day on 03/31/18. Please advise on how to proceed.

## 2018-03-29 NOTE — Telephone Encounter (Signed)
ucall pt , let her know her insurance requires PT to be done before they will approve surgery  so set up PT and ROV after 5 wks  Thanks.

## 2018-03-31 ENCOUNTER — Ambulatory Visit (INDEPENDENT_AMBULATORY_CARE_PROVIDER_SITE_OTHER): Payer: Medicare HMO | Admitting: Surgery

## 2018-03-31 DIAGNOSIS — J45909 Unspecified asthma, uncomplicated: Secondary | ICD-10-CM | POA: Diagnosis not present

## 2018-04-01 ENCOUNTER — Telehealth: Payer: Self-pay | Admitting: *Deleted

## 2018-04-01 ENCOUNTER — Encounter: Payer: Self-pay | Admitting: Family Medicine

## 2018-04-01 ENCOUNTER — Ambulatory Visit: Payer: Medicare HMO | Admitting: Family Medicine

## 2018-04-01 VITALS — BP 132/88 | HR 74 | Resp 14 | Ht 63.0 in | Wt 251.0 lb

## 2018-04-01 DIAGNOSIS — R9431 Abnormal electrocardiogram [ECG] [EKG]: Secondary | ICD-10-CM

## 2018-04-01 DIAGNOSIS — Z0000 Encounter for general adult medical examination without abnormal findings: Secondary | ICD-10-CM | POA: Diagnosis not present

## 2018-04-01 DIAGNOSIS — Z01818 Encounter for other preprocedural examination: Secondary | ICD-10-CM

## 2018-04-01 NOTE — Telephone Encounter (Signed)
Dr.  Etter Sjogren advised to call patient about abnormal ekg and that we are referring to cardiology.  Patient notified that we are sending her to cardiology.

## 2018-04-01 NOTE — Progress Notes (Signed)
Subjective:    Molly Klein is a 58 y.o. female who presents to the office today for a preoperative consultation at the request of surgeon Dr Lorin Mercy  who plans on performing cervical fusion -- date to be determined. This consultation is requested for the specific conditions prompting preoperative evaluation (i.e. because of potential affect on operative risk): sleep apnea, htn. Planned anesthesia: general. The patient has the following known anesthesia issues: none. Patients bleeding risk: no recent abnormal bleeding. The following portions of the patient's history were reviewed and updated as appropriate: She  has a past medical history of Asthma, Bruising (10/21/2012), DVT (deep venous thrombosis) ([place]), GERD (gastroesophageal reflux disease), Hepatitis C, History of DVT of lower extremity (10/21/2012), Knee pain, Neuromuscular disorder ([place]), Nipple discharge, and OSA (obstructive sleep apnea). She does not have any pertinent problems on file. She  has a past surgical history that includes Cholecystectomy; Pilonidal cyst excision; Dilation and curettage of uterus; Tonsillectomy; Abdominal hysterectomy; Fracture surgery; Breast biopsy (02/06/2011); right knee surgery; and Nipple repair. Her family history includes Alcohol abuse in her mother; Cancer in her maternal grandmother and sister; Heart attack in her father and mother; Rectal cancer in her maternal grandmother. She  reports that she quit smoking about 43 years ago. Her smoking use included cigarettes. She quit after 5.00 years of use. She has never used smokeless tobacco. She reports that she does not drink alcohol or use drugs. She has a current medication list which includes the following prescription(s): albuterol, albuterol, budesonide-formoterol, and valacyclovir. Current Outpatient Medications on File Prior to Visit  Medication Sig Dispense Refill  . albuterol (PROVENTIL HFA;VENTOLIN HFA) 108 (90 Base) MCG/ACT inhaler Inhale 2 puffs into  the lungs 4 (four) times daily as needed. Shortness of breath 1 Inhaler 6  . albuterol (PROVENTIL) (2.5 MG/3ML) 0.083% nebulizer solution INHALE 1 VIAL BY NEBULIZER AS DIRECTED 75 mL 0  . budesonide-formoterol (SYMBICORT) 160-4.5 MCG/ACT inhaler Inhale 2 puffs into the lungs 2 (two) times daily. 1 Inhaler 6  . valACYclovir (VALTREX) 1000 MG tablet 1 tablet by mouth tid x 7 days then 1 po qd (Patient taking differently: Take 1,000 mg by mouth daily. ) 42 tablet 5   No current facility-administered medications on file prior to visit.    She is allergic to codeine; interferon beta-1a; and sulfonamide derivatives..  Review of Systems Review of Systems  Constitutional: Negative for activity change, appetite change and fatigue.  HENT: Negative for hearing loss, congestion, tinnitus and ear discharge.  dentist q33m Eyes: Negative for visual disturbance (see optho q1y -- vision corrected to 20/20 with glasses).  Respiratory: Negative for cough, chest tightness and shortness of breath.   Cardiovascular: Negative for chest pain, palpitations and leg swelling.  Gastrointestinal: Negative for abdominal pain, diarrhea, constipation and abdominal distention.  Genitourinary: Negative for urgency, frequency, decreased urine volume and difficulty urinating.  Musculoskeletal: Negative for back pain, arthralgias and gait problem.  Skin: Negative for color change, pallor and rash.  Neurological: Negative for dizziness, light-headedness, numbness and headaches.  Hematological: Negative for adenopathy. Does not bruise/bleed easily.  Psychiatric/Behavioral: Negative for suicidal ideas, confusion, sleep disturbance, self-injury, dysphoric mood, decreased concentration and agitation.        Objective:    BP 132/88 (BP Location: Left Arm, Patient Position: Sitting, Cuff Size: Large)   Pulse 74   Resp 14   Ht 5\' 3"  (1.6 m)   Wt 251 lb (113.9 kg)   SpO2 96%   BMI 44.46 kg/m  BP  132/88 (BP Location: Left  Arm, Patient Position: Sitting, Cuff Size: Large)   Pulse 74   Resp 14   Ht 5\' 3"  (1.6 m)   Wt 251 lb (113.9 kg)   SpO2 96%   BMI 44.46 kg/m  General appearance: alert, cooperative, appears stated age and no distress Head: Normocephalic, without obvious abnormality, atraumatic Ears: normal TM's and external ear canals both ears Nose: Nares normal. Septum midline. Mucosa normal. No drainage or sinus tenderness. Throat: lips, mucosa, and tongue normal; teeth and gums normal Neck: no adenopathy, no carotid bruit, no JVD, supple, symmetrical, trachea midline and thyroid not enlarged, symmetric, no tenderness/mass/nodules Lungs: clear to auscultation bilaterally Heart: regular rate and rhythm, S1, S2 normal, no murmur, click, rub or gallop Abdomen: soft, non-tender; bowel sounds normal; no masses,  no organomegaly Extremities: extremities normal, atraumatic, no cyanosis or edema Pulses: 2+ and symmetric Skin: Skin color, texture, turgor normal. No rashes or lesions Lymph nodes: Cervical, supraclavicular, and axillary nodes normal. Neurologic: Alert and oriented X 3, normal strength and tone. Normal symmetric reflexes. Normal coordination and gait Cardiographics ECG: abn ekg when compared to last one  Echocardiogram: not done  Imaging Chest x-ray: normal   Lab Review   see labs    Assessment:      58 y.o. female with planned surgery as above.   Known risk factors for perioperative complications: sleep apnea and hx elevated bp , abnormal ekg    Difficulty with intubation is not anticipated.  Cardiac Risk Estimation: unknown-- pt referred to cardiology     Plan:    1. Preoperative workup as follows ECG, cardiology referral . 2. Change in medication regimen before surgery: per surgical team. 3 Deep vein thrombosis prophylaxis postoperatively:regimen to be chosen by surgical team. 4. Other measures: cardiology referrl , pulmonary clearance

## 2018-04-05 ENCOUNTER — Inpatient Hospital Stay (HOSPITAL_COMMUNITY): Admission: RE | Admit: 2018-04-05 | Payer: Medicare HMO | Source: Ambulatory Visit

## 2018-04-06 ENCOUNTER — Ambulatory Visit: Payer: Medicare HMO | Attending: Orthopaedic Surgery | Admitting: Physical Therapy

## 2018-04-06 ENCOUNTER — Other Ambulatory Visit: Payer: Self-pay

## 2018-04-06 DIAGNOSIS — M542 Cervicalgia: Secondary | ICD-10-CM | POA: Diagnosis not present

## 2018-04-06 DIAGNOSIS — M5412 Radiculopathy, cervical region: Secondary | ICD-10-CM | POA: Insufficient documentation

## 2018-04-06 DIAGNOSIS — R293 Abnormal posture: Secondary | ICD-10-CM

## 2018-04-06 DIAGNOSIS — M6281 Muscle weakness (generalized): Secondary | ICD-10-CM | POA: Diagnosis not present

## 2018-04-06 DIAGNOSIS — R29818 Other symptoms and signs involving the nervous system: Secondary | ICD-10-CM

## 2018-04-06 DIAGNOSIS — M62838 Other muscle spasm: Secondary | ICD-10-CM | POA: Insufficient documentation

## 2018-04-06 NOTE — Patient Instructions (Addendum)
TENS UNIT  This is helpful for muscle pain and spasm.   Search and Purchase a TENS 7000 2nd edition at www.tenspros.com or www.amazon.com  (It should be less than $30)     TENS unit instructions:   Do not shower or bathe with the unit on  Turn the unit off before removing electrodes or batteries  If the electrodes lose stickiness add a drop of water to the electrodes after they are disconnected from the unit and place on plastic sheet. If you continued to have difficulty, call the TENS unit company to purchase more electrodes.  Do not apply lotion on the skin area prior to use. Make sure the skin is clean and dry as this will help prolong the life of the electrodes.  After use, always check skin for unusual red areas, rash or other skin difficulties. If there are any skin problems, does not apply electrodes to the same area.  Never remove the electrodes from the unit by pulling the wires.  Do not use the TENS unit or electrodes other than as directed.  Do not change electrode placement without consulting your therapist or physician.  Keep 2 fingers with between each electrode.   TENS stands for Transcutaneous Electrical Nerve Stimulation. In other words, electrical impulses are allowed to pass through the skin in order to excite a nerve.   Purpose and Use of TENS:  TENS is a method used to manage acute and chronic pain without the use of drugs. It has been effective in managing pain associated with surgery, sprains, strains, trauma, rheumatoid arthritis, and neuralgias. It is a non-addictive, low risk, and non-invasive technique used to control pain. It is not, by any means, a curative form of treatment.   How TENS Works:  Most TENS units are a Paramedic unit powered by one 9 volt battery. Attached to the outside of the unit are two lead wires where two pins and/or snaps connect on each wire. All units come with a set of four reusable pads or electrodes. These are placed  on the skin surrounding the area involved. By inserting the leads into  the pads, the electricity can pass from the unit making the circuit complete.  As the intensity is turned up slowly, the electrical current enters the body from the electrodes through the skin to the surrounding nerve fibers. This triggers the release of hormones from within the body. These hormones contain pain relievers. By increasing the circulation of these hormones, the person's pain may be lessened. It is also believed that the electrical stimulation itself helps to block the pain messages being sent to the brain, thus also decreasing the body's perception of pain.   Hazards:  TENS units are NOT to be used by patients with PACEMAKERS, DEFIBRILLATORS, DIABETIC PUMPS, PREGNANT WOMEN, and patients with SEIZURE DISORDERS.  TENS units are NOT to be used over the heart, throat, brain, or spinal cord.  One of the major side effects from the TENS unit may be skin irritation. Some people may develop a rash if they are sensitive to the materials used in the electrodes or the connecting wires.   Wear the unit for up to 30-40 minutes at a time, as needed throughout the day.   Avoid overuse due the body getting used to the stem making it not as effective over time.      Sleeping on Back  Place pillow under knees. A pillow with cervical support and a roll around waist are also helpful.  Copyright  VHI. All rights reserved.  Sleeping on Side Place pillow between knees. Use cervical support under neck and a roll around waist as needed. Copyright  VHI. All rights reserved.   Sleeping on Stomach   If this is the only desirable sleeping position, place pillow under lower legs, and under stomach or chest as needed.  Posture - Sitting   Sit upright, head facing forward. Try using a roll to support lower back. Keep shoulders relaxed, and avoid rounded back. Keep hips level with knees. Avoid crossing legs for long periods. Stand to  Sit / Sit to Stand   To sit: Bend knees to lower self onto front edge of chair, then scoot back on seat. To stand: Reverse sequence by placing one foot forward, and scoot to front of seat. Use rocking motion to stand up.   Work Height and Reach  Ideal work height is no more than 2 to 4 inches below elbow level when standing, and at elbow level when sitting. Reaching should be limited to arm's length, with elbows slightly bent.  Bending  Bend at hips and knees, not back. Keep feet shoulder-width apart.    Posture - Standing   Good posture is important. Avoid slouching and forward head thrust. Maintain curve in low back and align ears over shoul- ders, hips over ankles.  Alternating Positions   Alternate tasks and change positions frequently to reduce fatigue and muscle tension. Take rest breaks. Computer Work   Position work to Programmer, multimedia. Use proper work and seat height. Keep shoulders back and down, wrists straight, and elbows at right angles. Use chair that provides full back support. Add footrest and lumbar roll as needed.  Getting Into / Out of Car  Lower self onto seat, scoot back, then bring in one leg at a time. Reverse sequence to get out.  Dressing  Lie on back to pull socks or slacks over feet, or sit and bend leg while keeping back straight.    Housework - Sink  Place one foot on ledge of cabinet under sink when standing at sink for prolonged periods.   Pushing / Pulling  Pushing is preferable to pulling. Keep back in proper alignment, and use leg muscles to do the work.  Deep Squat   Squat and lift with both arms held against upper trunk. Tighten stomach muscles without holding breath. Use smooth movements to avoid jerking.  Avoid Twisting   Avoid twisting or bending back. Pivot around using foot movements, and bend at knees if needed when reaching for articles.  Carrying Luggage   Distribute weight evenly on both sides. Use a cart whenever  possible. Do not twist trunk. Move body as a unit.   Lifting Principles .Maintain proper posture and head alignment. .Slide object as close as possible before lifting. .Move obstacles out of the way. .Test before lifting; ask for help if too heavy. .Tighten stomach muscles without holding breath. .Use smooth movements; do not jerk. .Use legs to do the work, and pivot with feet. .Distribute the work load symmetrically and close to the center of trunk. .Push instead of pull whenever possible.   Ask For Help   Ask for help and delegate to others when possible. Coordinate your movements when lifting together, and maintain the low back curve.  Log Roll   Lying on back, bend left knee and place left arm across chest. Roll all in one movement to the right. Reverse to roll to the left. Always move as  one unit. Housework - Sweeping  Use long-handled equipment to avoid stooping.   Housework - Wiping  Position yourself as close as possible to reach work surface. Avoid straining your back.  Laundry - Unloading Wash   To unload small items at bottom of washer, lift leg opposite to arm being used to reach.  [place] close to area to be raked. Use arm movements to do the work. Keep back straight and avoid twisting.     Cart  When reaching into cart with one arm, lift opposite leg to keep back straight.   Getting Into / Out of Bed  Lower self to lie down on one side by raising legs and lowering head at the same time. Use arms to assist moving without twisting. Bend both knees to roll onto back if desired. To sit up, start from lying on side, and use same move-ments in reverse. Housework - Vacuuming  Hold the vacuum with arm held at side. Step back and forth to move it, keeping head up. Avoid twisting.   Laundry - IT consultant so that bending and twisting can be avoided.   Laundry - Unloading Dryer  Squat down to reach into clothes  dryer or use a reacher.  Gardening - Weeding / Probation officer or Kneel. Knee pads may be helpful.
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
## 2018-04-07 ENCOUNTER — Ambulatory Visit: Payer: Medicare HMO | Admitting: Cardiology

## 2018-04-07 ENCOUNTER — Encounter: Payer: Self-pay | Admitting: Cardiology

## 2018-04-07 VITALS — BP 130/76 | HR 74 | Ht 63.0 in | Wt 254.0 lb

## 2018-04-07 DIAGNOSIS — Z01810 Encounter for preprocedural cardiovascular examination: Secondary | ICD-10-CM

## 2018-04-07 DIAGNOSIS — G4730 Sleep apnea, unspecified: Secondary | ICD-10-CM

## 2018-04-07 DIAGNOSIS — Z1211 Encounter for screening for malignant neoplasm of colon: Secondary | ICD-10-CM | POA: Insufficient documentation

## 2018-04-07 DIAGNOSIS — R9431 Abnormal electrocardiogram [ECG] [EKG]: Secondary | ICD-10-CM | POA: Diagnosis not present
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
## 2018-04-07 NOTE — Patient Instructions (Signed)
Medication Instructions:  Your physician recommends that you continue on your current medications as directed. Please refer to the Current Medication list given to you today.  If you need a refill on your cardiac medications before your next appointment, please call your pharmacy.   Lab work: None.  If you have labs (blood work) drawn today and your tests are completely normal, you will receive your results only by: . MyChart Message (if you have MyChart) OR . A paper copy in the mail If you have any lab test that is abnormal or we need to change your treatment, we will call you to review the results.  Testing/Procedures: Your physician has requested that you have an echocardiogram. Echocardiography is a painless test that uses sound waves to create images of your heart. It provides your doctor with information about the size and shape of your heart and how well your heart's chambers and valves are working. This procedure takes approximately one hour. There are no restrictions for this procedure.  Your physician has requested that you have a lexiscan myoview. For further information please visit [email or web address]. Please follow instruction sheet, as given.    Follow-Up:  Follow up as needed.    Any Other Special Instructions Will Be Listed Below (If Applicable).    Echocardiogram An echocardiogram is a procedure that uses painless sound waves (ultrasound) to produce an image of the heart. Images from an echocardiogram can provide important information about:  Signs of coronary artery disease (CAD).  Aneurysm detection. An aneurysm is a weak or damaged part of an artery wall that bulges out from the normal force of blood pumping through the body.  Heart size and shape. Changes in the size or shape of the heart can be associated with certain conditions, including heart failure, aneurysm, and CAD.  Heart muscle function.  Heart valve function.  Signs of a past heart  attack.  Fluid buildup around the heart.  Thickening of the heart muscle.  A tumor or infectious growth around the heart valves. Tell a health care provider about:  Any allergies you have.  All medicines you are taking, including vitamins, herbs, eye drops, creams, and over-the-counter medicines.  Any blood disorders you have.  Any surgeries you have had.  Any medical conditions you have.  Whether you are pregnant or may be pregnant. What are the risks? Generally, this is a safe procedure. However, problems may occur, including:  Allergic reaction to dye (contrast) that may be used during the procedure. What happens before the procedure? No specific preparation is needed. You may eat and drink normally. What happens during the procedure?   An IV tube may be inserted into one of your veins.  You may receive contrast through this tube. A contrast is an injection that improves the quality of the pictures from your heart.  A gel will be applied to your chest.  A wand-like tool (transducer) will be moved over your chest. The gel will help to transmit the sound waves from the transducer.  The sound waves will harmlessly bounce off of your heart to allow the heart images to be captured in real-time motion. The images will be recorded on a computer. The procedure may vary among health care providers and hospitals. What happens after the procedure?  You may return to your normal, everyday life, including diet, activities, and medicines, unless your health care provider tells you not to do that. Summary  An echocardiogram is a procedure that uses painless sound waves (  ultrasound) to produce an image of the heart.  Images from an echocardiogram can provide important information about the size and shape of your heart, heart muscle function, heart valve function, and fluid buildup around your heart.  You do not need to do anything to prepare before this procedure. You may eat and  drink normally.  After the echocardiogram is completed, you may return to your normal, everyday life, unless your health care provider tells you not to do that. This information is not intended to replace advice given to you by your health care provider. Make sure you discuss any questions you have with your health care provider. Document Released: 01/17/2000 Document Revised: 02/22/2016 Document Reviewed: 02/22/2016 Elsevier Interactive Patient Education  2019 Else[employer] Inc.    Cardiac Nuclear Scan A cardiac nuclear scan is a test that measures blood flow to the heart when a person is resting and when he or she is exercising. The test looks for problems such as:  Not enough blood reaching a portion of the heart.  The heart muscle not working normally. You may need this test if:  You have heart disease.  You have had abnormal lab results.  You have had heart surgery or a balloon procedure to open up blocked arteries (angioplasty).  You have chest pain.  You have shortness of breath. In this test, a radioactive dye (tracer) is injected into your bloodstream. After the tracer has traveled to your heart, an imaging device is used to measure how much of the tracer is absorbed by or distributed to various areas of your heart. This procedure is usually done at a hospital and takes 2-4 hours. Tell a health care provider about:  Any allergies you have.  All medicines you are taking, including vitamins, herbs, eye drops, creams, and over-the-counter medicines.  Any problems you or family members have had with anesthetic medicines.  Any blood disorders you have.  Any surgeries you have had.  Any medical conditions you have.  Whether you are pregnant or may be pregnant. What are the risks? Generally, this is a safe procedure. However, problems may occur, including:  Serious chest pain and heart attack. This is only a risk if the stress portion of the test is done.  Rapid  heartbeat.  Sensation of warmth in your chest. This usually passes quickly.  Allergic reaction to the tracer. What happens before the procedure?  Ask your health care provider about changing or stopping your regular medicines. This is especially important if you are taking diabetes medicines or blood thinners.  Follow instructions from your health care provider about eating or drinking restrictions.  Remove your jewelry on the day of the procedure. What happens during the procedure?  An IV will be inserted into one of your veins.  Your health care provider will inject a small amount of radioactive tracer through the IV.  You will wait for 20-40 minutes while the tracer travels through your bloodstream.  Your heart activity will be monitored with an electrocardiogram (ECG).  You will lie down on an exam table.  Images of your heart will be taken for about 15-20 minutes.  You may also have a stress test. For this test, one of the following may be done: ? You will exercise on a treadmill or stationary bike. While you exercise, your heart's activity will be monitored with an ECG, and your blood pressure will be checked. ? You will be given medicines that will increase blood flow to parts of your   heart. This is done if you are unable to exercise.  When blood flow to your heart has peaked, a tracer will again be injected through the IV.  After 20-40 minutes, you will get back on the exam table and have more images taken of your heart.  Depending on the type of tracer used, scans may need to be repeated 3-4 hours later.  Your IV line will be removed when the procedure is over. The procedure may vary among health care providers and hospitals. What happens after the procedure?  Unless your health care provider tells you otherwise, you may return to your normal schedule, including diet, activities, and medicines.  Unless your health care provider tells you otherwise, you may increase  your fluid intake. This will help to flush the contrast dye from your body. Drink enough fluid to keep your urine pale yellow.  Ask your health care provider, or the department that is doing the test: ? When will my results be ready? ? How will I get my results? Summary  A cardiac nuclear scan measures the blood flow to the heart when a person is resting and when he or she is exercising.  Tell your health care provider if you are pregnant.  Before the procedure, ask your health care provider about changing or stopping your regular medicines. This is especially important if you are taking diabetes medicines or blood thinners.  After the procedure, unless your health care provider tells you otherwise, increase your fluid intake. This will help flush the contrast dye from your body.  After the procedure, unless your health care provider tells you otherwise, you may return to your normal schedule, including diet, activities, and medicines. This information is not intended to replace advice given to you by your health care provider. Make sure you discuss any questions you have with your health care provider. Document Released: 02/14/2004 Document Revised: 07/05/2017 Document Reviewed: 07/05/2017 Elsevier Interactive Patient Education  2019 Elsevier Inc.   

## 2018-04-08 ENCOUNTER — Ambulatory Visit: Payer: Medicare HMO

## 2018-04-13 ENCOUNTER — Telehealth: Payer: Self-pay

## 2018-04-13 ENCOUNTER — Other Ambulatory Visit: Payer: Self-pay

## 2018-04-13 ENCOUNTER — Encounter: Payer: Self-pay | Admitting: Physical Therapy

## 2018-04-13 ENCOUNTER — Ambulatory Visit: Payer: Medicare HMO | Admitting: Physical Therapy

## 2018-04-13 ENCOUNTER — Telehealth (HOSPITAL_COMMUNITY): Payer: Self-pay

## 2018-04-13 ENCOUNTER — Ambulatory Visit (HOSPITAL_BASED_OUTPATIENT_CLINIC_OR_DEPARTMENT_OTHER)
Admission: RE | Admit: 2018-04-13 | Discharge: 2018-04-13 | Disposition: A | Discharge status: Home / Self Care | Payer: Medicare HMO | Source: Ambulatory Visit | Attending: Cardiology | Admitting: Cardiology

## 2018-04-13 DIAGNOSIS — M6281 Muscle weakness (generalized): Secondary | ICD-10-CM | POA: Diagnosis not present

## 2018-04-13 DIAGNOSIS — R29818 Other symptoms and signs involving the nervous system: Secondary | ICD-10-CM | POA: Diagnosis not present

## 2018-04-13 DIAGNOSIS — M62838 Other muscle spasm: Secondary | ICD-10-CM | POA: Diagnosis not present

## 2018-04-13 DIAGNOSIS — M542 Cervicalgia: Secondary | ICD-10-CM

## 2018-04-13 DIAGNOSIS — R9431 Abnormal electrocardiogram [ECG] [EKG]: Secondary | ICD-10-CM | POA: Diagnosis not present

## 2018-04-13 DIAGNOSIS — M5412 Radiculopathy, cervical region: Secondary | ICD-10-CM | POA: Diagnosis not present

## 2018-04-13 DIAGNOSIS — R293 Abnormal posture: Secondary | ICD-10-CM | POA: Diagnosis not present

## 2018-04-13 DIAGNOSIS — Z01810 Encounter for preprocedural cardiovascular examination: Secondary | ICD-10-CM | POA: Diagnosis not present

## 2018-04-13 NOTE — Telephone Encounter (Signed)
Patient called and notified of test results. 

## 2018-04-13 NOTE — Progress Notes (Signed)
  Echocardiogram 2D Echocardiogram has been performed.  Molly Klein [name] 04/13/2018, 10:16 AM

## 2018-04-13 NOTE — Telephone Encounter (Signed)
-----   Message from Jenean Lindau, MD sent at 04/13/2018  1:34 PM EDT ----- The results of the study is unremarkable. Please inform patient. I will discuss in detail at next appointment. Cc  primary care/referring physician Jenean Lindau, MD 04/13/2018 1:34 PM

## 2018-04-13 NOTE — Telephone Encounter (Signed)
Encounter complete. 
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
## 2018-04-15 ENCOUNTER — Ambulatory Visit: Admit: 2018-04-15 | Payer: Medicare HMO | Admitting: Orthopaedic Surgery

## 2018-04-15 SURGERY — ANTERIOR CERVICAL DECOMPRESSION/DISCECTOMY FUSION 1 LEVEL
Anesthesia: General

## 2018-04-19 ENCOUNTER — Ambulatory Visit (HOSPITAL_COMMUNITY)
Admission: RE | Admit: 2018-04-19 | Payer: Medicare HMO | Source: Ambulatory Visit | Attending: Cardiology | Admitting: Cardiology

## 2018-04-20 ENCOUNTER — Other Ambulatory Visit: Payer: Self-pay

## 2018-04-20 ENCOUNTER — Ambulatory Visit (HOSPITAL_COMMUNITY)
Admission: RE | Admit: 2018-04-20 | Discharge: 2018-04-20 | Disposition: A | Discharge status: Home / Self Care | Payer: Medicare HMO | Source: Ambulatory Visit | Attending: Internal Medicine | Admitting: Internal Medicine

## 2018-04-20 ENCOUNTER — Encounter: Payer: Medicare HMO | Admitting: Physical Therapy

## 2018-04-20 ENCOUNTER — Ambulatory Visit (HOSPITAL_COMMUNITY): Payer: Medicare HMO

## 2018-04-20 DIAGNOSIS — Z01810 Encounter for preprocedural cardiovascular examination: Secondary | ICD-10-CM | POA: Diagnosis not present

## 2018-04-20 DIAGNOSIS — R9431 Abnormal electrocardiogram [ECG] [EKG]: Secondary | ICD-10-CM

## 2018-04-20 MED ORDER — TECHNETIUM TC 99M TETROFOSMIN IV KIT
32.4000 | PACK | Freq: Once | INTRAVENOUS | Status: AC | PRN
  Administered 2018-04-20: 32.4 via INTRAVENOUS
  Filled 2018-04-20: qty 33

## 2018-04-20 MED ORDER — REGADENOSON 0.4 MG/5ML IV SOLN
0.4000 mg | Freq: Once | INTRAVENOUS | Status: AC
  Administered 2018-04-20: 0.4 mg via INTRAVENOUS

## 2018-04-21 ENCOUNTER — Ambulatory Visit (HOSPITAL_COMMUNITY)
Admission: RE | Admit: 2018-04-21 | Discharge: 2018-04-21 | Disposition: A | Discharge status: Home / Self Care | Payer: Medicare HMO | Source: Ambulatory Visit | Attending: Cardiology | Admitting: Cardiology

## 2018-04-21 LAB — MYOCARDIAL PERFUSION IMAGING
LV dias vol: 80 mL (ref 46–106)
LVSYSVOL: 33 mL
Peak HR: 92 {beats}/min
Rest HR: 61 {beats}/min
SDS: 6
SRS: 0
TID: 0.9

## 2018-04-21 MED ORDER — TECHNETIUM TC 99M TETROFOSMIN IV KIT
32.0000 | PACK | Freq: Once | INTRAVENOUS | Status: AC | PRN
  Administered 2018-04-21: 32 via INTRAVENOUS

## 2018-04-22 ENCOUNTER — Telehealth: Payer: Self-pay

## 2018-04-22 ENCOUNTER — Inpatient Hospital Stay (INDEPENDENT_AMBULATORY_CARE_PROVIDER_SITE_OTHER): Payer: Medicare HMO | Admitting: Orthopaedic Surgery

## 2018-04-22 NOTE — Telephone Encounter (Signed)
Patient called and notified of test results. 

## 2018-04-22 NOTE — Telephone Encounter (Signed)
-----   Message from Jenean Lindau, MD sent at 04/22/2018 11:59 AM EDT ----- The results of the study is unremarkable. Please inform patient. I will discuss in detail at next appointment. Cc  primary care/referring physician Jenean Lindau, MD 04/22/2018 11:59 AM

## 2018-04-27 ENCOUNTER — Ambulatory Visit: Payer: Medicare HMO

## 2018-04-27 ENCOUNTER — Other Ambulatory Visit: Payer: Self-pay

## 2018-04-27 DIAGNOSIS — R29818 Other symptoms and signs involving the nervous system: Secondary | ICD-10-CM | POA: Diagnosis not present

## 2018-04-27 DIAGNOSIS — M6281 Muscle weakness (generalized): Secondary | ICD-10-CM

## 2018-04-27 DIAGNOSIS — R293 Abnormal posture: Secondary | ICD-10-CM

## 2018-04-27 DIAGNOSIS — M542 Cervicalgia: Secondary | ICD-10-CM | POA: Diagnosis not present

## 2018-04-27 DIAGNOSIS — G4733 Obstructive sleep apnea (adult) (pediatric): Secondary | ICD-10-CM | POA: Diagnosis not present

## 2018-04-27 DIAGNOSIS — M5412 Radiculopathy, cervical region: Secondary | ICD-10-CM | POA: Diagnosis not present

## 2018-04-27 DIAGNOSIS — M62838 Other muscle spasm: Secondary | ICD-10-CM
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
## 2018-04-29 DIAGNOSIS — J45909 Unspecified asthma, uncomplicated: Secondary | ICD-10-CM | POA: Diagnosis not present

## 2018-05-02 ENCOUNTER — Telehealth (INDEPENDENT_AMBULATORY_CARE_PROVIDER_SITE_OTHER): Payer: Self-pay | Admitting: *Deleted

## 2018-05-02 NOTE — Telephone Encounter (Signed)
I called pt to prescreen for COVID 19 before appt scheduled 05/03/18, [facility] to go over questions

## 2018-05-03 ENCOUNTER — Encounter (INDEPENDENT_AMBULATORY_CARE_PROVIDER_SITE_OTHER): Payer: Self-pay | Admitting: Orthopaedic Surgery

## 2018-05-03 ENCOUNTER — Ambulatory Visit (INDEPENDENT_AMBULATORY_CARE_PROVIDER_SITE_OTHER): Payer: Medicare HMO | Admitting: Orthopaedic Surgery

## 2018-05-03 ENCOUNTER — Other Ambulatory Visit: Payer: Self-pay

## 2018-05-03 VITALS — Ht 63.0 in | Wt 253.0 lb

## 2018-05-03 DIAGNOSIS — M5020 Other cervical disc displacement, unspecified cervical region: Secondary | ICD-10-CM | POA: Diagnosis not present

## 2018-05-03 NOTE — Progress Notes (Signed)
Office Visit Note   Patient: Molly Klein           Date of Birth: 12-11-1960           MRN: [id number] Visit Date: 05/03/2018              Requested by: [address] [place], [place] [address] [place], [place] [address] PCP: Carollee Herter, Alferd Apa, DO   Assessment & Plan: Visit Diagnoses:  1. Protrusion of cervical intervertebral disc     Plan: We will recheck patient in 6 weeks.  She still having significant neck pain and radicular symptoms and MRI scan showed C4-5 central disc protrusion with flattening of the cord and 6 mm AP diameter canal at C4-5.  She does not have mild myelopathy.  She remains a candidate for elective surgery which is temporarily on hold due to the coronavirus situation.  We will recheck her in 6 weeks.  If she has increased falling episodes her legs giving way or balance issues she will call us.  Follow-Up Instructions: Return in about 6 weeks (around 06/14/2018).   Orders:  No orders of the defined types were placed in this encounter.  No orders of the defined types were placed in this encounter.     Procedures: No procedures performed   Clinical Data: No additional findings.   Subjective: Chief Complaint  Patient presents with  . Neck - Follow-up, Pain    HPI 58 year old female returns for follow-up of cervical disc protrusion with flattening of the cord at C4-5 without abnormal cord signal.  Patient states she was at [employer] walking down the aisle suddenly her legs gave way and she fell.  This is her only falling episode.  She has been going through therapy for her cervical spine.  Originally her surgery was delayed due to her husband being in the ICU after an accident and he ultimately passed away in 10-Mar-2022.  MRI scan was 01/03/2018 cervical spine.  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  HENT:     Head: Normocephalic.     Right Ear: External ear normal.     Left Ear: External ear normal.  Eyes:     Pupils: Pupils are equal, round, and reactive to light.  Neck:     Thyroid: No thyromegaly.     Trachea: No tracheal deviation.  Cardiovascular:     Rate and Rhythm: Normal rate.  Pulmonary:     Effort: Pulmonary effort is normal.  Abdominal:     Palpations: Abdomen is soft.  Skin:    General: Skin is warm and dry.  Neurological:     Mental Status: She is alert and oriented to person, place, and time.  Psychiatric:        Behavior: Behavior normal.     Ortho Exam patient has decreased cervical rotation to the left of 50% the opposite rotation to the right.  Flexion extension is maintained.  Knee and ankle jerk are 1+ no clonus normal heel toe gait.  No deltoid atrophy.  Specialty Comments:  No specialty comments available.  Imaging: No results found.   PMFS History: Patient Active Problem  List   Diagnosis Date Noted  . Pre-operative cardiovascular examination 04/07/2018  . Sleep apnea 04/07/2018  . Morbid obesity with body mass index (BMI) of 40.0 to 49.9 ([place]) 04/07/2018  . Grief counseling 03/17/2018  . Pre-op evaluation 03/16/2018  . Protrusion of cervical intervertebral disc 01/10/2018  . Right wrist injury 08/26/2014  . Left knee pain 08/26/2014  . Severe obesity (BMI >= 40) ([place]) 03/12/2014  . Popliteal pain 12/20/2013  . Contusion of left calf 12/13/2013  . Acute bronchitis with asthma with acute exacerbation 05/15/2013  . Bruising 10/21/2012  . History of DVT of lower extremity 10/21/2012  . Acute thoracic back pain 09/28/2012  . Sinus congestion 04/01/2012  . Solitary pulmonary nodule 10/07/2011  . Lichenoid keratosis 56/38/9373  . 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[place]) 07/07/2011  . Nipple discharge  in female, right 02/04/2011  . UNSPECIFIED ESSENTIAL HYPERTENSION 02/06/2010  . STRESS INCONTINENCE 04/22/2009  . UTI 10/24/2008  . PATELLAR DISLOCATION, RIGHT 10/24/2008  . HEPATITIS C 10/08/2006  . SLEEP APNEA, OBSTRUCTIVE 10/08/2006  . NARCOLEPSY W/O CATAPLEXY 10/08/2006  . Mild persistent asthma 10/08/2006  . GERD 10/08/2006  . OVERACTIVE BLADDER 10/08/2006  . FIBROCYSTIC BREAST DISEASE 10/08/2006   Past Medical History:  Diagnosis Date  . Asthma   . Bruising 10/21/2012  . DVT (deep venous thrombosis) ([place])   . GERD (gastroesophageal reflux disease)   . Hepatitis C   . History of DVT of lower extremity 10/21/2012  . Knee pain    left   . Neuromuscular disorder (HCC)    neuropathy lt arm  . Nipple discharge    right breast  . OSA (obstructive sleep apnea)     Family History  Problem Relation Age of Onset  . Heart attack Mother        mi in 83s  . Alcohol abuse Mother        cirrhosis of liver  . Heart attack Father        MI in 54s  . Rectal cancer Maternal Grandmother   . Cancer Maternal Grandmother        breast  . Cancer Sister        pt unaware of what kind    Past Surgical History:  Procedure Laterality Date  . ABDOMINAL HYSTERECTOMY    . BREAST BIOPSY  02/06/2011   Procedure: BREAST BIOPSY;  Surgeon: Earnstine Regal, MD;  Location: [facility];  Service: General;  Laterality: Right;  right breast biopsy  . CHOLECYSTECTOMY    . DILATION AND CURETTAGE OF UTERUS    . FRACTURE SURGERY     fx lt arm  . Nipple repair    . PILONIDAL CYST EXCISION    . right knee surgery    . TONSILLECTOMY     Social History   Occupational History  . Occupation: retired    Fish farm manager: DISABLED  Tobacco Use  . Smoking status: Former Smoker    Years: 5.00    Types: Cigarettes    Last attempt to quit: 02/03/1975    Years since quitting: 43.2  . Smokeless tobacco: Never Used  . Tobacco comment: 1 pack per week  Substance and Sexual Activity  . Alcohol use: No     Alcohol/week: 0.0 standard drinks  . Drug use: No  . Sexual activity: Yes    Partners: Male

## 2018-05-05 ENCOUNTER — Ambulatory Visit: Payer: Medicare HMO

## 2018-05-10 ENCOUNTER — Ambulatory Visit: Payer: Medicare HMO | Attending: Orthopaedic Surgery | Admitting: Physical Therapy

## 2018-05-10 DIAGNOSIS — M5412 Radiculopathy, cervical region: Secondary | ICD-10-CM | POA: Insufficient documentation

## 2018-05-10 DIAGNOSIS — R293 Abnormal posture: Secondary | ICD-10-CM | POA: Insufficient documentation

## 2018-05-10 DIAGNOSIS — R29818 Other symptoms and signs involving the nervous system: Secondary | ICD-10-CM | POA: Insufficient documentation

## 2018-05-10 DIAGNOSIS — M62838 Other muscle spasm: Secondary | ICD-10-CM | POA: Insufficient documentation

## 2018-05-10 DIAGNOSIS — M6281 Muscle weakness (generalized): Secondary | ICD-10-CM | POA: Insufficient documentation

## 2018-05-10 DIAGNOSIS — M542 Cervicalgia: Secondary | ICD-10-CM | POA: Insufficient documentation

## 2018-05-11 ENCOUNTER — Ambulatory Visit: Payer: Medicare HMO | Admitting: Physical Therapy

## 2018-05-12 ENCOUNTER — Other Ambulatory Visit: Payer: Self-pay

## 2018-05-12 ENCOUNTER — Encounter: Payer: Medicare HMO | Admitting: Physical Therapy

## 2018-05-12 ENCOUNTER — Encounter: Payer: Self-pay | Admitting: Physical Therapy

## 2018-05-12 ENCOUNTER — Ambulatory Visit: Payer: Medicare HMO | Admitting: Physical Therapy

## 2018-05-12 DIAGNOSIS — R29818 Other symptoms and signs involving the nervous system: Secondary | ICD-10-CM

## 2018-05-12 DIAGNOSIS — R293 Abnormal posture: Secondary | ICD-10-CM

## 2018-05-12 DIAGNOSIS — M542 Cervicalgia: Secondary | ICD-10-CM | POA: Diagnosis not present

## 2018-05-12 DIAGNOSIS — M6281 Muscle weakness (generalized): Secondary | ICD-10-CM | POA: Diagnosis not present

## 2018-05-12 DIAGNOSIS — M62838 Other muscle spasm: Secondary | ICD-10-CM

## 2018-05-12 DIAGNOSIS — M5412 Radiculopathy, cervical region: Secondary | ICD-10-CM | POA: Diagnosis not present
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
## 2018-05-18 ENCOUNTER — Ambulatory Visit: Payer: Medicare HMO

## 2018-05-18 ENCOUNTER — Other Ambulatory Visit: Payer: Self-pay

## 2018-05-18 DIAGNOSIS — M6281 Muscle weakness (generalized): Secondary | ICD-10-CM

## 2018-05-18 DIAGNOSIS — M5412 Radiculopathy, cervical region: Secondary | ICD-10-CM | POA: Diagnosis not present

## 2018-05-18 DIAGNOSIS — R29818 Other symptoms and signs involving the nervous system: Secondary | ICD-10-CM

## 2018-05-18 DIAGNOSIS — M542 Cervicalgia: Secondary | ICD-10-CM | POA: Diagnosis not present

## 2018-05-18 DIAGNOSIS — R293 Abnormal posture: Secondary | ICD-10-CM

## 2018-05-18 DIAGNOSIS — M62838 Other muscle spasm: Secondary | ICD-10-CM
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
## 2018-05-24 ENCOUNTER — Encounter: Payer: Self-pay | Admitting: Physical Therapy

## 2018-05-24 ENCOUNTER — Other Ambulatory Visit: Payer: Self-pay

## 2018-05-24 ENCOUNTER — Ambulatory Visit: Payer: Medicare HMO | Admitting: Physical Therapy

## 2018-05-24 DIAGNOSIS — M62838 Other muscle spasm: Secondary | ICD-10-CM

## 2018-05-24 DIAGNOSIS — M5412 Radiculopathy, cervical region: Secondary | ICD-10-CM | POA: Diagnosis not present

## 2018-05-24 DIAGNOSIS — M542 Cervicalgia: Secondary | ICD-10-CM

## 2018-05-24 DIAGNOSIS — R29818 Other symptoms and signs involving the nervous system: Secondary | ICD-10-CM

## 2018-05-24 DIAGNOSIS — R293 Abnormal posture: Secondary | ICD-10-CM

## 2018-05-24 DIAGNOSIS — M6281 Muscle weakness (generalized): Secondary | ICD-10-CM | POA: Diagnosis not present
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
## 2018-05-25 ENCOUNTER — Encounter: Payer: Medicare HMO | Admitting: Physical Therapy

## 2018-05-28 DIAGNOSIS — G4733 Obstructive sleep apnea (adult) (pediatric): Secondary | ICD-10-CM | POA: Diagnosis not present

## 2018-05-30 DIAGNOSIS — J45909 Unspecified asthma, uncomplicated: Secondary | ICD-10-CM | POA: Diagnosis not present

## 2018-06-01 ENCOUNTER — Ambulatory Visit: Payer: Medicare HMO

## 2018-06-01 ENCOUNTER — Other Ambulatory Visit: Payer: Self-pay

## 2018-06-01 DIAGNOSIS — M542 Cervicalgia: Secondary | ICD-10-CM

## 2018-06-01 DIAGNOSIS — M6281 Muscle weakness (generalized): Secondary | ICD-10-CM

## 2018-06-01 DIAGNOSIS — R293 Abnormal posture: Secondary | ICD-10-CM

## 2018-06-01 DIAGNOSIS — M5412 Radiculopathy, cervical region: Secondary | ICD-10-CM | POA: Diagnosis not present

## 2018-06-01 DIAGNOSIS — M62838 Other muscle spasm: Secondary | ICD-10-CM | POA: Diagnosis not present

## 2018-06-01 DIAGNOSIS — R29818 Other symptoms and signs involving the nervous system: Secondary | ICD-10-CM
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
## 2018-06-08 ENCOUNTER — Encounter: Payer: Self-pay | Admitting: Physical Therapy

## 2018-06-08 ENCOUNTER — Other Ambulatory Visit: Payer: Self-pay

## 2018-06-08 ENCOUNTER — Ambulatory Visit: Payer: Medicare HMO | Attending: Orthopaedic Surgery | Admitting: Physical Therapy

## 2018-06-08 DIAGNOSIS — M62838 Other muscle spasm: Secondary | ICD-10-CM | POA: Diagnosis not present

## 2018-06-08 DIAGNOSIS — M6281 Muscle weakness (generalized): Secondary | ICD-10-CM | POA: Diagnosis not present

## 2018-06-08 DIAGNOSIS — R293 Abnormal posture: Secondary | ICD-10-CM

## 2018-06-08 DIAGNOSIS — M5412 Radiculopathy, cervical region: Secondary | ICD-10-CM | POA: Diagnosis not present

## 2018-06-08 DIAGNOSIS — M542 Cervicalgia: Secondary | ICD-10-CM | POA: Insufficient documentation

## 2018-06-08 DIAGNOSIS — R29818 Other symptoms and signs involving the nervous system: Secondary | ICD-10-CM | POA: Insufficient documentation
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
## 2018-06-14 ENCOUNTER — Ambulatory Visit: Payer: Medicare HMO | Admitting: Orthopaedic Surgery

## 2018-06-14 ENCOUNTER — Encounter: Payer: Self-pay | Admitting: Orthopaedic Surgery

## 2018-06-14 ENCOUNTER — Other Ambulatory Visit: Payer: Self-pay

## 2018-06-14 VITALS — Ht 63.0 in | Wt 253.0 lb

## 2018-06-14 DIAGNOSIS — M4802 Spinal stenosis, cervical region: Secondary | ICD-10-CM | POA: Diagnosis not present

## 2018-06-14 DIAGNOSIS — M5020 Other cervical disc displacement, unspecified cervical region: Secondary | ICD-10-CM

## 2018-06-14 IMAGING — US US EXTREM LOW VENOUS*L*
1 series · 14 of 24 positions shown · non-contrast
Comparison: None.

CLINICAL DATA: History a left knee Baker's cysts. Left knee
swelling and tightness for 2 weeks.

EXAM:
LEFT LOWER EXTREMITY VENOUS DUPLEX ULTRASOUND
TECHNIQUE: Doppler venous assessment of the left lower extremity deep venous
system was performed, including characterization of spectral flow,
compressibility, and phasicity.

[Series 1: us extrem low venous*left* · 0.08mm/px · 14 of 28 slices shown]
[im 1/28]
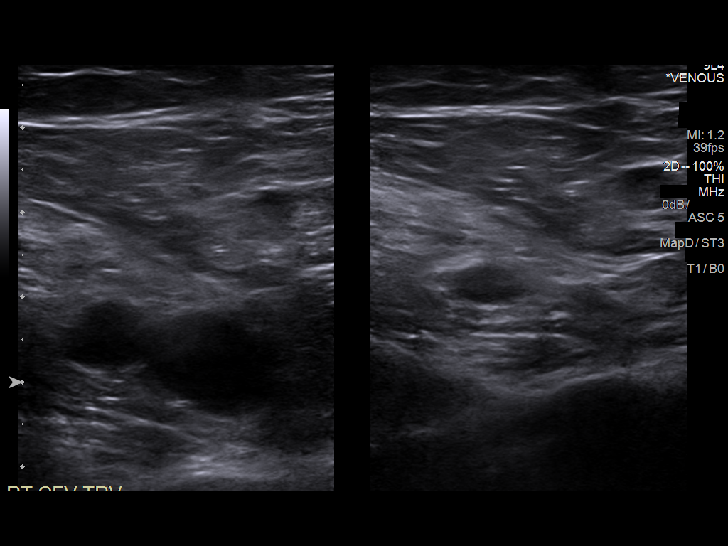
[im 3/28]
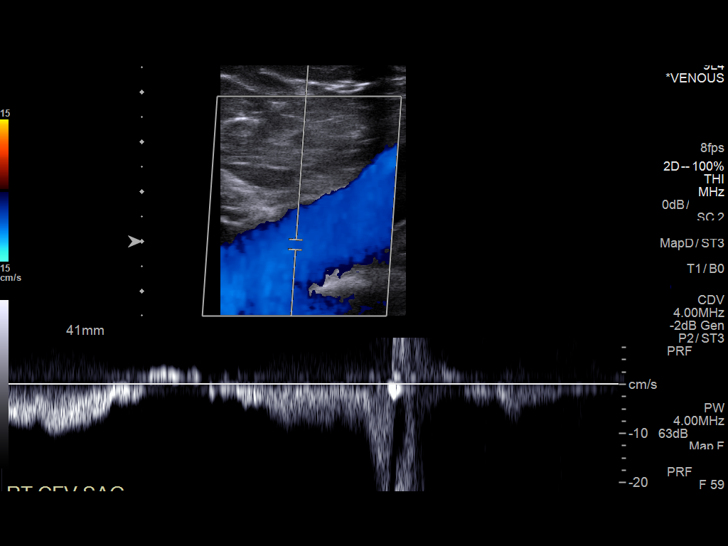
[im 5/28]
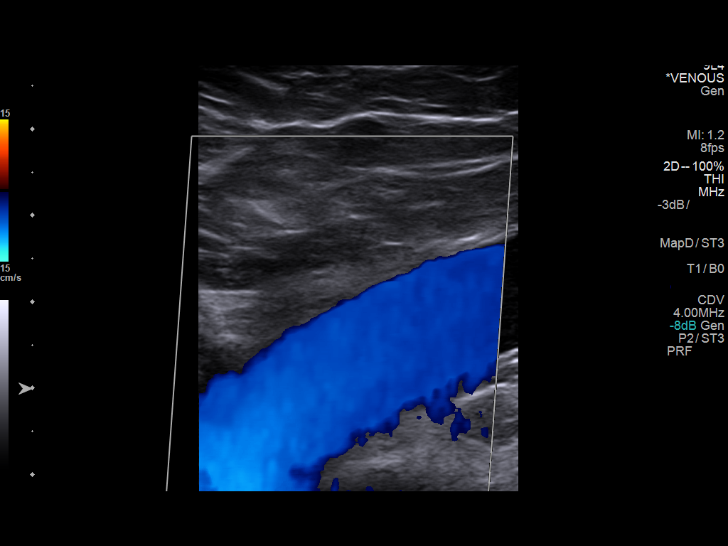
[im 8/28]
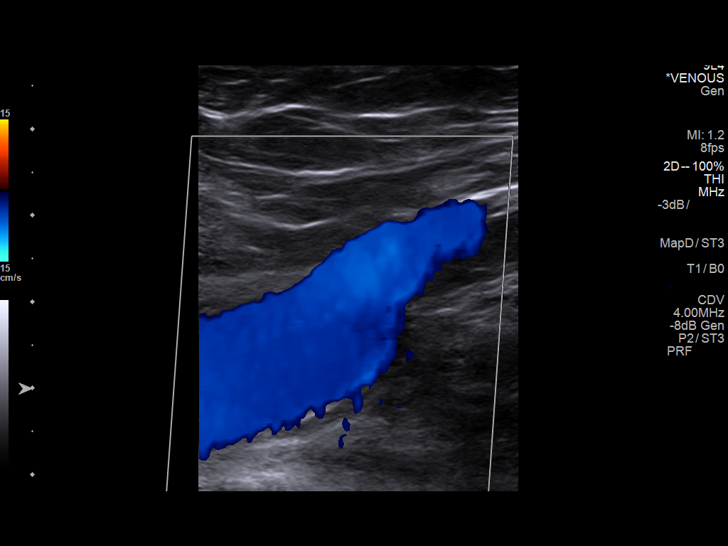
[im 9/28]
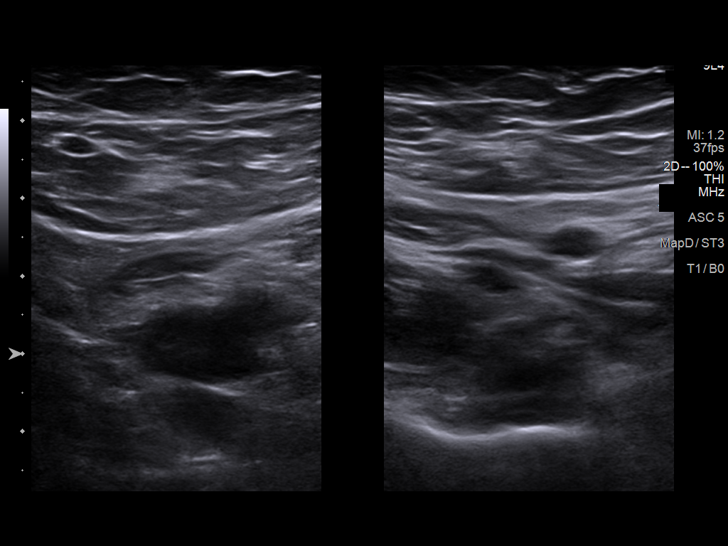
[im 11/28]
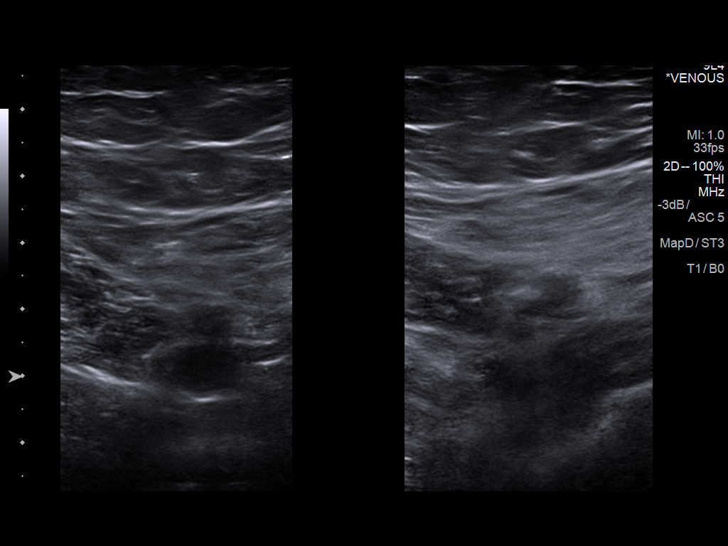
[im 13/28]
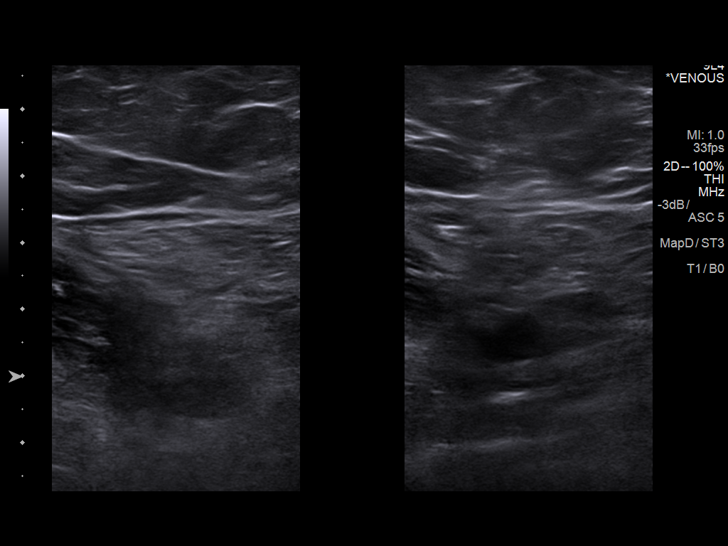
[im 15/28]
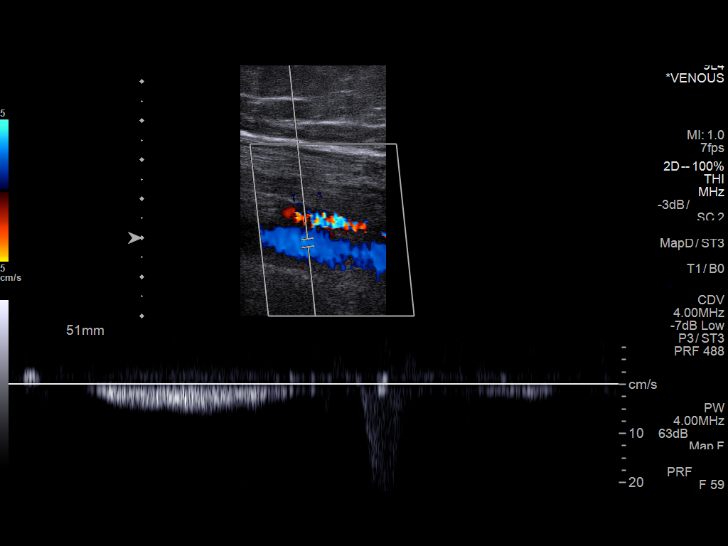
[im 17/28]
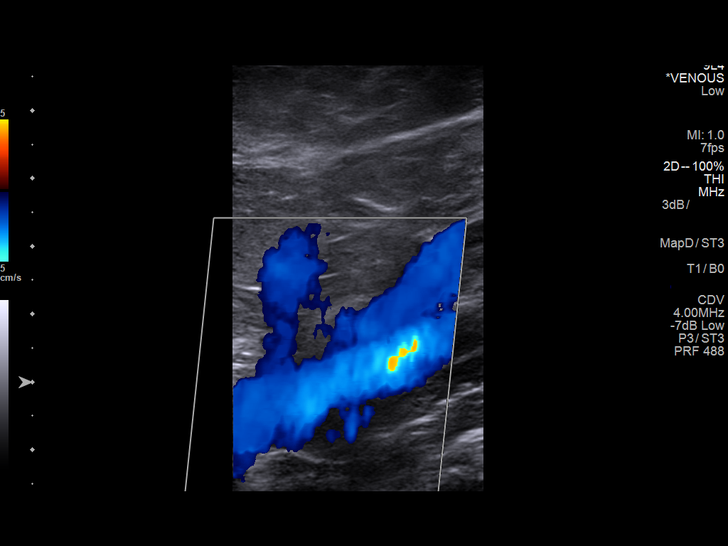
[im 19/28]
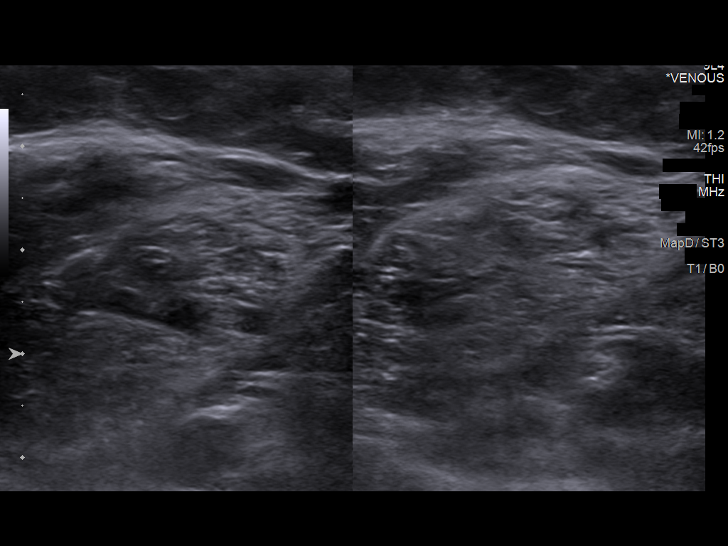
[im 22/28]
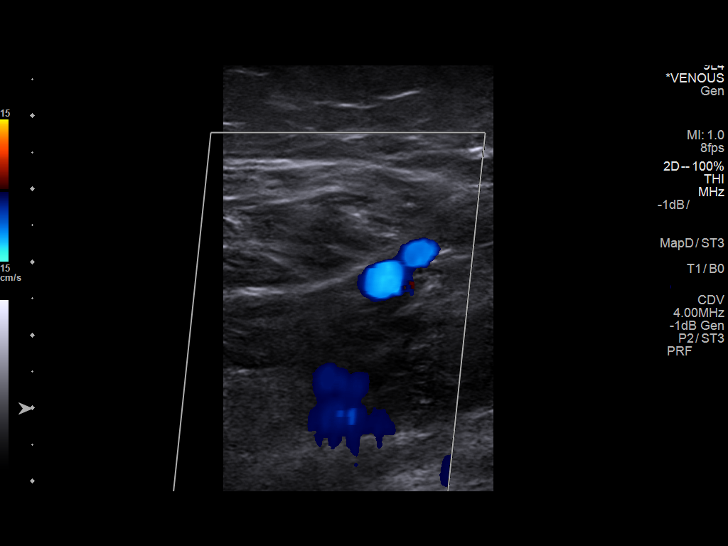
[im 23/28]
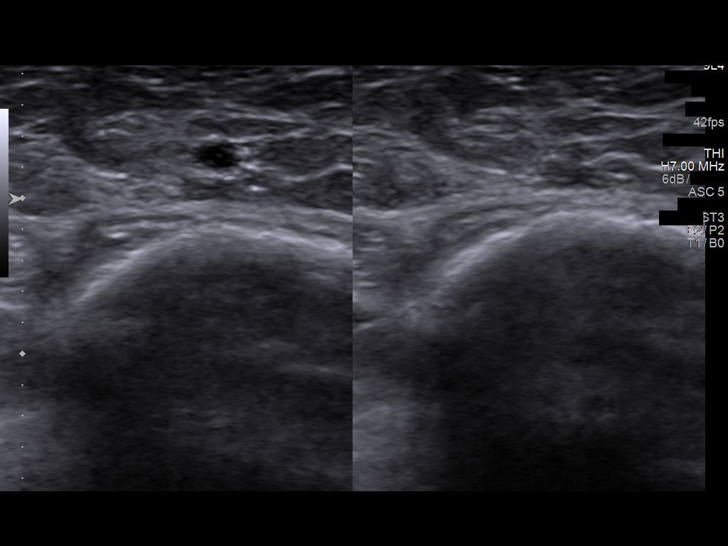
[im 25/28]
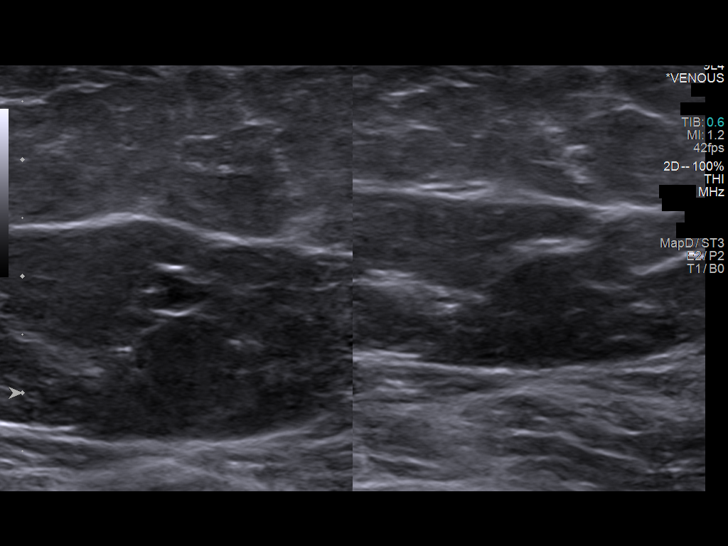
[im 28/28]
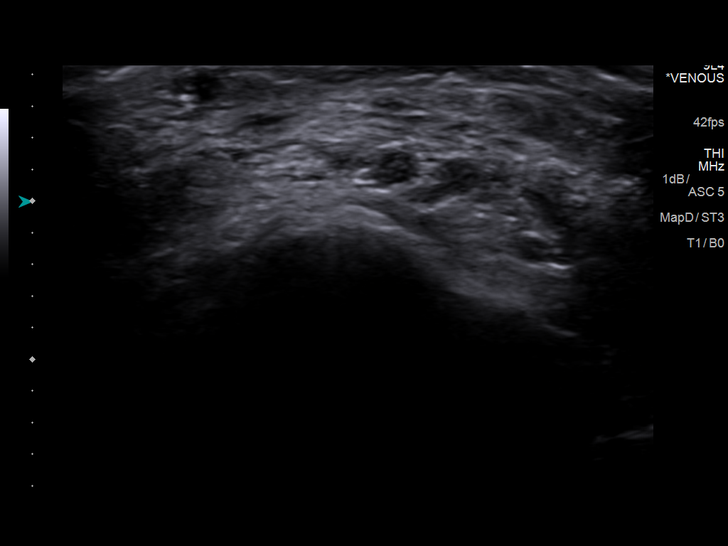

[14 of 24 positions shown; findings below may reference images not displayed]

FINDINGS: There is complete compressibility of the common femoral and
popliteal veins. Imaging of the distal femoral vein was limited. The
femoral vein is grossly compressible and without venous thrombosis.
Doppler analysis demonstrates respiratory phasicity and augmentation
of flow with calf compression. No obvious superficial vein or calf
vein thrombosis.
IMPRESSION: No evidence of acute left lower extremity DVT.
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
## 2018-06-16 DIAGNOSIS — G4733 Obstructive sleep apnea (adult) (pediatric): Secondary | ICD-10-CM | POA: Diagnosis not present

## 2018-06-27 DIAGNOSIS — G4733 Obstructive sleep apnea (adult) (pediatric): Secondary | ICD-10-CM | POA: Diagnosis not present

## 2018-06-29 DIAGNOSIS — J45909 Unspecified asthma, uncomplicated: Secondary | ICD-10-CM | POA: Diagnosis not present

## 2018-07-05 ENCOUNTER — Telehealth: Payer: Self-pay | Admitting: Radiology

## 2018-07-05 NOTE — Telephone Encounter (Signed)
Please see below and advise. Would you like to post for patient?

## 2018-07-05 NOTE — Telephone Encounter (Signed)
Patient called questioning about scheduling her neck surgery with Dr. Lorin Mercy. I explained surgery centers and hospitals are not fully scheduling surgeries just yet, our office has been closed, and currently there was no surgery scheduler in our building today.  She was thankful for explanation, however said Dr. Lorin Mercy wanted to do surgery this month. If she can just be updated on the process, since her neck pain is continuing to get worse.  CB # [id number]

## 2018-07-05 NOTE — Telephone Encounter (Signed)
Maybe can be put on 2nd case on 6/17 .   Malachy Mood can call her tomorrow. thanks

## 2018-07-06 NOTE — Telephone Encounter (Signed)
Per Dr. Lorin Mercy, send to you. If I can help you with anything, please let me know.

## 2018-07-07 DIAGNOSIS — J4520 Mild intermittent asthma, uncomplicated: Secondary | ICD-10-CM | POA: Diagnosis not present

## 2018-07-07 DIAGNOSIS — J309 Allergic rhinitis, unspecified: Secondary | ICD-10-CM | POA: Diagnosis not present

## 2018-07-07 DIAGNOSIS — N3281 Overactive bladder: Secondary | ICD-10-CM | POA: Diagnosis not present

## 2018-07-07 DIAGNOSIS — M25642 Stiffness of left hand, not elsewhere classified: Secondary | ICD-10-CM | POA: Diagnosis not present

## 2018-07-07 DIAGNOSIS — G5692 Unspecified mononeuropathy of left upper limb: Secondary | ICD-10-CM | POA: Diagnosis not present

## 2018-07-07 DIAGNOSIS — M25542 Pain in joints of left hand: Secondary | ICD-10-CM | POA: Diagnosis not present

## 2018-07-07 DIAGNOSIS — M5030 Other cervical disc degeneration, unspecified cervical region: Secondary | ICD-10-CM | POA: Diagnosis not present

## 2018-07-07 DIAGNOSIS — R202 Paresthesia of skin: Secondary | ICD-10-CM | POA: Diagnosis not present

## 2018-07-07 DIAGNOSIS — K219 Gastro-esophageal reflux disease without esophagitis: Secondary | ICD-10-CM | POA: Diagnosis not present

## 2018-07-11 ENCOUNTER — Encounter: Payer: Self-pay | Admitting: Orthopaedic Surgery

## 2018-07-11 ENCOUNTER — Telehealth: Payer: Self-pay | Admitting: Orthopaedic Surgery

## 2018-07-11 NOTE — Telephone Encounter (Signed)
Patient called in regard to surgery.  Please call patient back @ [phone]

## 2018-07-11 NOTE — Telephone Encounter (Signed)
Debbie, just making sure you are aware of this patient.

## 2018-07-13 NOTE — Pre-Procedure Instructions (Signed)
[facility] #[id number] [address], [address] AT [facility] OF [address] [place] [address] Phone: [phone] Fax: [phone]      Your procedure is scheduled on Wednesday, June, 17th.  Report to [facility] Entr[facility] "A" at 1:00 P.M., and check in at the Admitting office.  Call this number if you have problems the morning of surgery:  [phone]  Call [phone] if you have any questions prior to your surgery date Monday-Friday 8am-4pm    Remember:  Do not eat or drink after midnight.    Take these medicines the morning of surgery with A SIP OF WATER  valACYclovir (VALTREX)-if needed Inhalers-bring your rescue inhaler with you to the hospital.   As of today, STOP taking any Aspirin (unless otherwise instructed by your surgeon), Aleve, Naproxen, Ibuprofen, Motrin, Advil, Goody's, BC's, all herbal medications, fish oil, and all vitamins.    The Morning of Surgery  Do not wear jewelry, make-up or nail polish.  Do not wear lotions, powders, or perfumes, or deodorant  Do not shave 48 hours prior to surgery.  Men may shave face and neck.  Do not bring valuables to the hospital.  [facility] is not responsible for any belongings or valuables.  If you are a smoker, DO NOT Smoke 24 hours prior to surgery IF you wear a CPAP at night please bring your mask, tubing, and machine the morning of surgery   Remember that you must have someone to transport you home after your surgery, and remain with you for 24 hours if you are discharged the same day.   Contacts, glasses, hearing aids, dentures or bridgework may not be worn into surgery.    Leave your suitcase in the car.  After surgery it may be brought to your room.  For patients admitted to the hospital, discharge time will be determined by your treatment team.  Patients discharged the day of surgery will not be allowed to drive home.    Special instructions:   [facility]- Preparing For Surgery  Before surgery, you can play an important role. Because skin is not sterile, your skin needs to be as free of germs as possible. You can reduce the number of germs on your skin by washing with CHG (chlorahexidine gluconate) Soap before surgery.  CHG is an antiseptic cleaner which kills germs and bonds with the skin to continue killing germs even after washing.    Oral Hygiene is also important to reduce your risk of infection.  Remember - BRUSH YOUR TEETH THE MORNING OF SURGERY WITH YOUR REGULAR TOOTHPASTE  Please do not use if you have an allergy to CHG or antibacterial soaps. If your skin becomes reddened/irritated stop using the CHG.  Do not shave (including legs and underarms) for at least 48 hours prior to first CHG shower. It is OK to shave your face.  Please follow these instructions carefully.   1. Shower the NIGHT BEFORE SURGERY and the MORNING OF SURGERY with CHG Soap.   2. If you chose to wash your hair, wash your hair first as usual with your normal shampoo.  3. After you shampoo, rinse your hair and body thoroughly to remove the shampoo.  4. Use CHG as you would any other liquid soap. You can apply CHG directly to the skin and wash gently with a scrungie or a clean washcloth.   5. Apply the CHG Soap to your body ONLY FROM  THE NECK DOWN.  Do not use on open wounds or open sores. Avoid contact with your eyes, ears, mouth and genitals (private parts). Wash Face and genitals (private parts)  with your normal soap.   6. Wash thoroughly, paying special attention to the area where your surgery will be performed.  7. Thoroughly rinse your body with warm water from the neck down.  8. DO NOT shower/wash with your normal soap after using and rinsing off the CHG Soap.  9. Pat yourself dry with a CLEAN TOWEL.  10. Wear CLEAN PAJAMAS to bed the night before surgery, wear comfortable clothes the morning of surgery  11. Place CLEAN SHEETS on your bed the  night of your first shower and DO NOT SLEEP WITH PETS.    Day of Surgery:  Do not apply any deodorants/lotions.  Please wear clean clothes to the hospital/surgery center.   Remember to brush your teeth WITH YOUR REGULAR TOOTHPASTE.   Please read over the following fact sheets that you were given.

## 2018-07-14 ENCOUNTER — Encounter (HOSPITAL_COMMUNITY)
Admission: RE | Admit: 2018-07-14 | Discharge: 2018-07-14 | Disposition: A | Discharge status: Home / Self Care | Payer: Medicare HMO | Source: Ambulatory Visit | Attending: Orthopaedic Surgery | Admitting: Orthopaedic Surgery

## 2018-07-14 ENCOUNTER — Other Ambulatory Visit: Payer: Self-pay

## 2018-07-14 ENCOUNTER — Encounter (HOSPITAL_COMMUNITY): Payer: Self-pay

## 2018-07-14 DIAGNOSIS — Z01818 Encounter for other preprocedural examination: Secondary | ICD-10-CM | POA: Insufficient documentation

## 2018-07-14 DIAGNOSIS — Z1159 Encounter for screening for other viral diseases: Secondary | ICD-10-CM | POA: Diagnosis not present

## 2018-07-14 HISTORY — DX: Essential (primary) hypertension: I10

## 2018-07-14 LAB — URINALYSIS, ROUTINE W REFLEX MICROSCOPIC
Bilirubin Urine: NEGATIVE
Glucose, UA: 150 mg/dL — AB
Hgb urine dipstick: NEGATIVE
Ketones, ur: NEGATIVE mg/dL
Leukocytes,Ua: NEGATIVE
Nitrite: NEGATIVE
Protein, ur: NEGATIVE mg/dL
Specific Gravity, Urine: 1.014 (ref 1.005–1.030)
pH: 6 (ref 5.0–8.0)

## 2018-07-14 LAB — CBC
HCT: 41.3 % (ref 36.0–46.0)
Hemoglobin: 14.3 g/dL (ref 12.0–15.0)
MCH: 29.5 pg (ref 26.0–34.0)
MCHC: 34.6 g/dL (ref 30.0–36.0)
MCV: 85.3 fL (ref 80.0–100.0)
Platelets: 185 10*3/uL (ref 150–400)
RBC: 4.84 MIL/uL (ref 3.87–5.11)
RDW: 13.8 % (ref 11.5–15.5)
WBC: 3.3 10*3/uL — ABNORMAL LOW (ref 4.0–10.5)
nRBC: 0 % (ref 0.0–0.2)

## 2018-07-14 LAB — COMPREHENSIVE METABOLIC PANEL
ALT: 25 U/L (ref 0–44)
AST: 22 U/L (ref 15–41)
Albumin: 3.7 g/dL (ref 3.5–5.0)
Alkaline Phosphatase: 72 U/L (ref 38–126)
Anion gap: 12 (ref 5–15)
BUN: 10 mg/dL (ref 6–20)
CO2: 20 mmol/L — ABNORMAL LOW (ref 22–32)
Calcium: 9.1 mg/dL (ref 8.9–10.3)
Chloride: 107 mmol/L (ref 98–111)
Creatinine, Ser: 0.77 mg/dL (ref 0.44–1.00)
GFR calc Af Amer: >60 mL/min (ref >60)
GFR calc non Af Amer: >60 mL/min (ref >60)
Glucose, Bld: 198 mg/dL — ABNORMAL HIGH (ref 70–99)
Potassium: 3.8 mmol/L (ref 3.5–5.1)
Sodium: 139 mmol/L (ref 135–145)
Total Bilirubin: 0.3 mg/dL (ref 0.3–1.2)
Total Protein: 7.1 g/dL (ref 6.5–8.1)

## 2018-07-14 LAB — SURGICAL PCR SCREEN
MRSA, PCR: NEGATIVE
Staphylococcus aureus: NEGATIVE

## 2018-07-14 NOTE — Progress Notes (Signed)
PCP - Dr. Lizbeth Bark [place] Cardiologist - Dr. Geraldo Pitter?   Chest x-ray - 03/16/18 EKG - 04/01/18 Stress Test - 04/21/18 ECHO - 04/13/18 Cardiac Cath - denies   Sleep Study - 05/20/16 CPAP - nightly use  Blood Thinner Instructions: N/A Aspirin Instructions:N/A  Anesthesia review: Yes, cardiac clearance   Patient denies shortness of breath, fever, cough and chest pain at PAT appointment   Patient verbalized understanding of instructions that were given to them at the PAT appointment. Patient was also instructed that they will need to review over the PAT instructions again at home before surgery.  Pt has a scheduled appointment for Covid Testing on Saturday.   Coronavirus Screening  Have you experienced the following symptoms:  Cough yes/no: No Fever (>100.34F)  yes/no: No Runny nose yes/no: No Sore throat yes/no: No Difficulty breathing/shortness of breath  yes/no: No  Have you or a family member traveled in the last 14 days and where? yes/no: No   If the patient indicates "YES" to the above questions, their PAT will be rescheduled to limit the exposure to others and, the surgeon will be notified. THE PATIENT WILL NEED TO BE ASYMPTOMATIC FOR 14 DAYS.   If the patient is not experiencing any of these symptoms, the PAT nurse will instruct them to NOT bring anyone with them to their appointment since they may have these symptoms or traveled as well.   Please remind your patients and families that hospital visitation restrictions are in effect and the importance of the restrictions.

## 2018-07-14 NOTE — Pre-Procedure Instructions (Addendum)
[facility] #[id number] [address], [address] AT [facility] OF [address] [place] [address] Phone: [phone] Fax: [phone]      Your procedure is scheduled on Wednesday, June 17th.  [facility] to [facility] En[facility] "A" at 1:00 P.M., and check in at the Admitting office.  Call this number if you have problems the morning of surgery:  [phone]  Call [phone] if you have any questions prior to your surgery date Monday-Friday 8am-4pm    Remember:  Do not eat after midnight.  You may drink clear liquids until 4:30 A.M.   Clear liquids allowed are: Water, Non-Citrus Juices (without pulp), Carbonated Beverages, Clear Tea, Black Coffee Only, and Gatorade    Take these medicines the morning of surgery with A SIP OF WATER  valACYclovir (VALTREX)  Inhalers-bring your rescue inhaler with you to the hospital.   Please drink your PRE-SURGERY ENSURE by 4:30 A.M. Please drink in one sitting, do not sip.   As of today, STOP taking any Aspirin (unless otherwise instructed by your surgeon), Aleve, Naproxen, Ibuprofen, Motrin, Advil, Goody's, BC's, all herbal medications, fish oil, and all vitamins.    The Morning of Surgery  Do not wear jewelry, make-up or nail polish.  Do not wear lotions, powders, or perfumes, or deodorant  Do not shave 48 hours prior to surgery.    Do not bring valuables to the hospital.  [facility] is not responsible for any belongings or valuables.  If you are a smoker, DO NOT Smoke 24 hours prior to surgery IF you wear a CPAP at night please bring your mask, tubing, and machine the morning of surgery   Remember that you must have someone to transport you home after your surgery, and remain with you for 24 hours if you are discharged the same day.   Contacts, glasses, hearing aids, dentures or bridgework may not be worn into surgery.    Leave your suitcase in the car.  After surgery it may be brought to  your room.  For patients admitted to the hospital, discharge time will be determined by your treatment team.  Patients discharged the day of surgery will not be allowed to drive home.    Special instructions:   [facility]- Preparing For Surgery  Before surgery, you can play an important role. Because skin is not sterile, your skin needs to be as free of germs as possible. You can reduce the number of germs on your skin by washing with CHG (chlorahexidine gluconate) Soap before surgery.  CHG is an antiseptic cleaner which kills germs and bonds with the skin to continue killing germs even after washing.    Oral Hygiene is also important to reduce your risk of infection.  Remember - BRUSH YOUR TEETH THE MORNING OF SURGERY WITH YOUR REGULAR TOOTHPASTE  Please do not use if you have an allergy to CHG or antibacterial soaps. If your skin becomes reddened/irritated stop using the CHG.  Do not shave (including legs and underarms) for at least 48 hours prior to first CHG shower. It is OK to shave your face.  Please follow these instructions carefully.   1. Shower the NIGHT BEFORE SURGERY and the MORNING OF SURGERY with CHG Soap.   2. If you chose to wash your hair, wash your hair first as usual with your normal shampoo.  3. After you shampoo, rinse your hair and body thoroughly to remove the shampoo.  4. Use CHG as you would any other liquid soap. You can apply CHG directly to the skin and wash gently with a scrungie or a clean washcloth.   5. Apply the CHG Soap to your body ONLY FROM THE NECK DOWN.  Do not use on open wounds or open sores. Avoid contact with your eyes, ears, mouth and genitals (private parts). Wash Face and genitals (private parts)  with your normal soap.   6. Wash thoroughly, paying special attention to the area where your surgery will be performed.  7. Thoroughly rinse your body with warm water from the neck down.  8. DO NOT shower/wash with your normal soap after using  and rinsing off the CHG Soap.  9. Pat yourself dry with a CLEAN TOWEL.  10. Wear CLEAN PAJAMAS to bed the night before surgery, wear comfortable clothes the morning of surgery  11. Place CLEAN SHEETS on your bed the night of your first shower and DO NOT SLEEP WITH PETS.    Day of Surgery:  Do not apply any deodorants/lotions.  Please wear clean clothes to the hospital/surgery center.   Remember to brush your teeth WITH YOUR REGULAR TOOTHPASTE.   Please read over the following fact sheets that you were given.

## 2018-07-15 ENCOUNTER — Encounter (HOSPITAL_COMMUNITY): Payer: Self-pay

## 2018-07-15 ENCOUNTER — Other Ambulatory Visit: Payer: Self-pay

## 2018-07-15 NOTE — Anesthesia Preprocedure Evaluation (Addendum)
Anesthesia Evaluation  Patient identified by MRN, date of birth, ID band Patient awake    Reviewed: Allergy & Precautions, NPO status , Patient's Chart, lab work & pertinent test results  Airway Mallampati: III  TM Distance: >3 FB Neck ROM: Limited    Dental  (+) Teeth Intact, Dental Advisory Given   Pulmonary asthma , sleep apnea and Continuous Positive Airway Pressure Ventilation , former smoker,    Pulmonary exam normal breath sounds clear to auscultation       Cardiovascular hypertension, + DVT  Normal cardiovascular exam Rhythm:Regular Rate:Normal     Neuro/Psych C4-5 protrusion, cervical stenosis   Neuromuscular disease (Left arm) negative psych ROS   GI/Hepatic GERD  ,(+) Hepatitis - (s/p treatment)  Endo/Other  Morbid obesity  Renal/GU negative Renal ROS     Musculoskeletal negative musculoskeletal ROS (+)   Abdominal   Peds  Hematology negative hematology ROS (+)   Anesthesia Other Findings Day of surgery medications reviewed with the patient.  Reproductive/Obstetrics                          Anesthesia Physical Anesthesia Plan  ASA: III  Anesthesia Plan: General   Post-op Pain Management:    Induction: Intravenous  PONV Risk Score and Plan: 4 or greater and Midazolam, Dexamethasone, Ondansetron and Diphenhydramine  Airway Management Planned: Oral ETT and Video Laryngoscope Planned  Additional Equipment:   Intra-op Plan:   Post-operative Plan: Extubation in OR  Informed Consent: I have reviewed the patients History and Physical, chart, labs and discussed the procedure including the risks, benefits and alternatives for the proposed anesthesia with the patient or authorized representative who has indicated his/her understanding and acceptance.     Dental advisory given  Plan Discussed with: CRNA  Anesthesia Plan Comments: (Seen by Revankar 04/07/18 for preop  clearance. Per his note "In view of multiple risk factors for coronary artery disease we will do a Lexiscan sestamibi.  Echocardiogram will be done to assess murmur heard on auscultation.  If the a forementioned is negative then she is not at high risk for coronary events during the aforementioned surgery.  Meticulous hemodynamic monitoring will further reduce the risk of coronary events."  Nuclear stress 04/21/18:  The left ventricular ejection fraction is normal (55-65%).  Nuclear stress EF: 59%.  There was no ST segment deviation noted during stress.  The study is normal.  This is a low risk study.   Normal pharmacologic nuclear stress test with no evidence for a prior infarct or ischemia. Normal LVEF.   TTE 04/13/18:  1. The left ventricle has normal systolic function with an ejection fraction of 60-65%. The cavity size was normal. Left ventricular diastolic parameters were normal.  2. The right ventricle has normal systolic function. The cavity was normal. There is no increase in right ventricular wall thickness.  3. The aortic valve is tricuspid Aortic valve regurgitation was not assessed by color flow Doppler.  )      Anesthesia Quick Evaluation

## 2018-07-16 ENCOUNTER — Other Ambulatory Visit (HOSPITAL_COMMUNITY)
Admission: RE | Admit: 2018-07-16 | Discharge: 2018-07-16 | Disposition: A | Discharge status: Home / Self Care | Payer: Medicare HMO | Source: Ambulatory Visit | Attending: Orthopaedic Surgery | Admitting: Orthopaedic Surgery

## 2018-07-16 DIAGNOSIS — Z01818 Encounter for other preprocedural examination: Secondary | ICD-10-CM | POA: Diagnosis not present

## 2018-07-18 LAB — NOVEL CORONAVIRUS, NAA (HOSP ORDER, SEND-OUT TO REF LAB; TAT 18-24 HRS): SARS-CoV-2, NAA: NOT DETECTED

## 2018-07-19 MED ORDER — DEXTROSE 5 % IV SOLN
3.0000 g | INTRAVENOUS | Status: AC
  Administered 2018-07-20: 3 g via INTRAVENOUS
  Filled 2018-07-19: qty 3

## 2018-07-20 ENCOUNTER — Encounter (HOSPITAL_COMMUNITY)
Admission: RE | Disposition: A | Discharge status: Home / Self Care | Payer: Self-pay | Source: Home / Self Care | Attending: Orthopaedic Surgery

## 2018-07-20 ENCOUNTER — Ambulatory Visit (HOSPITAL_COMMUNITY): Payer: Medicare HMO

## 2018-07-20 ENCOUNTER — Observation Stay (HOSPITAL_COMMUNITY)
Admission: RE | Admit: 2018-07-20 | Discharge: 2018-07-21 | Disposition: A | Discharge status: Home / Self Care | Payer: Medicare HMO | Attending: Orthopaedic Surgery | Admitting: Orthopaedic Surgery

## 2018-07-20 ENCOUNTER — Encounter (HOSPITAL_COMMUNITY): Payer: Self-pay

## 2018-07-20 ENCOUNTER — Ambulatory Visit (HOSPITAL_COMMUNITY): Payer: Medicare HMO | Admitting: Anesthesiology

## 2018-07-20 ENCOUNTER — Other Ambulatory Visit: Payer: Self-pay

## 2018-07-20 ENCOUNTER — Ambulatory Visit (HOSPITAL_COMMUNITY): Payer: Medicare HMO | Admitting: Physician Assistant

## 2018-07-20 DIAGNOSIS — J45909 Unspecified asthma, uncomplicated: Secondary | ICD-10-CM | POA: Diagnosis not present

## 2018-07-20 DIAGNOSIS — M5126 Other intervertebral disc displacement, lumbar region: Secondary | ICD-10-CM | POA: Diagnosis not present

## 2018-07-20 DIAGNOSIS — M4802 Spinal stenosis, cervical region: Secondary | ICD-10-CM | POA: Diagnosis present

## 2018-07-20 DIAGNOSIS — Z87891 Personal history of nicotine dependence: Secondary | ICD-10-CM | POA: Insufficient documentation

## 2018-07-20 DIAGNOSIS — I10 Essential (primary) hypertension: Secondary | ICD-10-CM | POA: Diagnosis not present

## 2018-07-20 DIAGNOSIS — Z86718 Personal history of other venous thrombosis and embolism: Secondary | ICD-10-CM | POA: Insufficient documentation

## 2018-07-20 DIAGNOSIS — Z419 Encounter for procedure for purposes other than remedying health state, unspecified: Secondary | ICD-10-CM

## 2018-07-20 DIAGNOSIS — Z6841 Body Mass Index (BMI) 40.0 and over, adult: Secondary | ICD-10-CM | POA: Insufficient documentation

## 2018-07-20 DIAGNOSIS — M50221 Other cervical disc displacement at C4-C5 level: Principal | ICD-10-CM | POA: Insufficient documentation

## 2018-07-20 DIAGNOSIS — Z8619 Personal history of other infectious and parasitic diseases: Secondary | ICD-10-CM | POA: Diagnosis not present

## 2018-07-20 DIAGNOSIS — G4733 Obstructive sleep apnea (adult) (pediatric): Secondary | ICD-10-CM | POA: Insufficient documentation

## 2018-07-20 DIAGNOSIS — J4530 Mild persistent asthma, uncomplicated: Secondary | ICD-10-CM | POA: Diagnosis not present

## 2018-07-20 DIAGNOSIS — Z981 Arthrodesis status: Secondary | ICD-10-CM | POA: Diagnosis not present

## 2018-07-20 HISTORY — PX: ANTERIOR CERVICAL DECOMP/DISCECTOMY FUSION: SHX1161

## 2018-07-20 HISTORY — DX: Spinal stenosis, cervical region: M48.02

## 2018-07-20 LAB — HEMOGLOBIN A1C
Hgb A1c MFr Bld: 7.2 % — ABNORMAL HIGH (ref 4.8–5.6)
Mean Plasma Glucose: 159.94 mg/dL

## 2018-07-20 SURGERY — ANTERIOR CERVICAL DECOMPRESSION/DISCECTOMY FUSION 1 LEVEL
Anesthesia: General | Site: Spine Cervical

## 2018-07-20 MED ORDER — DOCUSATE SODIUM 100 MG PO CAPS
100.0000 mg | ORAL_CAPSULE | Freq: Two times a day (BID) | ORAL | Status: DC
  Administered 2018-07-20 (×2): 100 mg via ORAL
  Filled 2018-07-20: qty 1

## 2018-07-20 MED ORDER — ACETAMINOPHEN 500 MG PO TABS
ORAL_TABLET | ORAL | Status: AC
  Filled 2018-07-20: qty 1

## 2018-07-20 MED ORDER — DEXAMETHASONE SODIUM PHOSPHATE 10 MG/ML IJ SOLN
INTRAMUSCULAR | Status: DC | PRN
  Administered 2018-07-20: 10 mg via INTRAVENOUS

## 2018-07-20 MED ORDER — BUPIVACAINE-EPINEPHRINE 0.5% -1:200000 IJ SOLN
INTRAMUSCULAR | Status: DC | PRN
  Administered 2018-07-20: 10 mL

## 2018-07-20 MED ORDER — CEFAZOLIN SODIUM-DEXTROSE 1-4 GM/50ML-% IV SOLN
1.0000 g | Freq: Three times a day (TID) | INTRAVENOUS | Status: AC
  Administered 2018-07-20 – 2018-07-21 (×2): 1 g via INTRAVENOUS
  Filled 2018-07-20 (×2): qty 50

## 2018-07-20 MED ORDER — ALBUTEROL SULFATE (2.5 MG/3ML) 0.083% IN NEBU: 2.5000 mg | INHALATION_SOLUTION | Freq: Four times a day (QID) | RESPIRATORY_TRACT | Status: DC | PRN

## 2018-07-20 MED ORDER — FENTANYL CITRATE (PF) 100 MCG/2ML IJ SOLN
25.0000 ug | INTRAMUSCULAR | Status: DC | PRN
  Administered 2018-07-20 (×2): 50 ug via INTRAVENOUS

## 2018-07-20 MED ORDER — METHOCARBAMOL 500 MG PO TABS
500.0000 mg | ORAL_TABLET | Freq: Four times a day (QID) | ORAL | Status: DC | PRN
  Administered 2018-07-20 – 2018-07-21 (×2): 500 mg via ORAL
  Filled 2018-07-20 (×2): qty 1

## 2018-07-20 MED ORDER — SODIUM CHLORIDE 0.9% FLUSH: 3.0000 mL | INTRAVENOUS | Status: DC | PRN

## 2018-07-20 MED ORDER — DEXAMETHASONE SODIUM PHOSPHATE 10 MG/ML IJ SOLN
INTRAMUSCULAR | Status: AC
  Filled 2018-07-20: qty 1

## 2018-07-20 MED ORDER — ONDANSETRON HCL 4 MG PO TABS: 4.0000 mg | ORAL_TABLET | Freq: Four times a day (QID) | ORAL | Status: DC | PRN

## 2018-07-20 MED ORDER — SODIUM CHLORIDE 0.9 % IV SOLN: 250.0000 mL | INTRAVENOUS | Status: DC

## 2018-07-20 MED ORDER — MENTHOL 3 MG MT LOZG: 1.0000 | LOZENGE | OROMUCOSAL | Status: DC | PRN

## 2018-07-20 MED ORDER — ALBUTEROL SULFATE HFA 108 (90 BASE) MCG/ACT IN AERS: 2.0000 | INHALATION_SPRAY | Freq: Four times a day (QID) | RESPIRATORY_TRACT | Status: DC | PRN

## 2018-07-20 MED ORDER — THROMBIN (RECOMBINANT) 20000 UNITS EX SOLR
CUTANEOUS | Status: AC
  Filled 2018-07-20: qty 20000

## 2018-07-20 MED ORDER — FENTANYL CITRATE (PF) 100 MCG/2ML IJ SOLN
INTRAMUSCULAR | Status: DC | PRN
  Administered 2018-07-20: 100 ug via INTRAVENOUS
  Administered 2018-07-20 (×2): 50 ug via INTRAVENOUS

## 2018-07-20 MED ORDER — HYDROMORPHONE HCL 1 MG/ML IJ SOLN: 0.5000 mg | INTRAMUSCULAR | Status: DC | PRN

## 2018-07-20 MED ORDER — SUGAMMADEX SODIUM 500 MG/5ML IV SOLN
INTRAVENOUS | Status: DC | PRN
  Administered 2018-07-20: 300 mg via INTRAVENOUS

## 2018-07-20 MED ORDER — ROCURONIUM BROMIDE 50 MG/5ML IV SOSY
PREFILLED_SYRINGE | INTRAVENOUS | Status: DC | PRN
  Administered 2018-07-20: 50 mg via INTRAVENOUS
  Administered 2018-07-20: 20 mg via INTRAVENOUS

## 2018-07-20 MED ORDER — ONDANSETRON HCL 4 MG/2ML IJ SOLN
INTRAMUSCULAR | Status: AC
  Filled 2018-07-20: qty 2

## 2018-07-20 MED ORDER — LACTATED RINGERS IV SOLN
INTRAVENOUS | Status: DC
  Administered 2018-07-20: 11:00:00 via INTRAVENOUS

## 2018-07-20 MED ORDER — SUGAMMADEX SODIUM 500 MG/5ML IV SOLN
INTRAVENOUS | Status: AC
  Filled 2018-07-20: qty 5

## 2018-07-20 MED ORDER — MIDAZOLAM HCL 2 MG/2ML IJ SOLN
INTRAMUSCULAR | Status: AC
  Filled 2018-07-20: qty 2

## 2018-07-20 MED ORDER — GABAPENTIN 300 MG PO CAPS
300.0000 mg | ORAL_CAPSULE | Freq: Once | ORAL | Status: AC
  Administered 2018-07-20: 300 mg via ORAL
  Filled 2018-07-20: qty 1

## 2018-07-20 MED ORDER — SODIUM CHLORIDE 0.9 % IV SOLN: INTRAVENOUS | Status: DC

## 2018-07-20 MED ORDER — PROMETHAZINE HCL 25 MG/ML IJ SOLN: 6.2500 mg | INTRAMUSCULAR | Status: DC | PRN

## 2018-07-20 MED ORDER — PROPOFOL 10 MG/ML IV BOLUS
INTRAVENOUS | Status: DC | PRN
  Administered 2018-07-20: 150 mg via INTRAVENOUS

## 2018-07-20 MED ORDER — PHENOL 1.4 % MT LIQD: 1.0000 | OROMUCOSAL | Status: DC | PRN

## 2018-07-20 MED ORDER — ACETAMINOPHEN 500 MG PO TABS
1000.0000 mg | ORAL_TABLET | Freq: Once | ORAL | Status: AC
  Administered 2018-07-20: 11:00:00 1000 mg via ORAL
  Filled 2018-07-20: qty 2

## 2018-07-20 MED ORDER — LACTATED RINGERS IV SOLN
INTRAVENOUS | Status: DC | PRN
  Administered 2018-07-20 (×2): via INTRAVENOUS

## 2018-07-20 MED ORDER — CHLORHEXIDINE GLUCONATE 4 % EX LIQD: 60.0000 mL | Freq: Once | CUTANEOUS | Status: DC

## 2018-07-20 MED ORDER — ONDANSETRON HCL 4 MG/2ML IJ SOLN
INTRAMUSCULAR | Status: AC
  Filled 2018-07-20: qty 4

## 2018-07-20 MED ORDER — 0.9 % SODIUM CHLORIDE (POUR BTL) OPTIME
TOPICAL | Status: DC | PRN
  Administered 2018-07-20: 13:00:00 1000 mL

## 2018-07-20 MED ORDER — ONDANSETRON HCL 4 MG/2ML IJ SOLN: 4.0000 mg | Freq: Four times a day (QID) | INTRAMUSCULAR | Status: DC | PRN

## 2018-07-20 MED ORDER — HEMOSTATIC AGENTS (NO CHARGE) OPTIME
TOPICAL | Status: DC | PRN
  Administered 2018-07-20: 1 via TOPICAL

## 2018-07-20 MED ORDER — LIDOCAINE 2% (20 MG/ML) 5 ML SYRINGE
INTRAMUSCULAR | Status: DC | PRN
  Administered 2018-07-20: 100 mg via INTRAVENOUS

## 2018-07-20 MED ORDER — PROPOFOL 10 MG/ML IV BOLUS
INTRAVENOUS | Status: AC
  Filled 2018-07-20: qty 20

## 2018-07-20 MED ORDER — SODIUM CHLORIDE 0.9% FLUSH
3.0000 mL | Freq: Two times a day (BID) | INTRAVENOUS | Status: DC
  Administered 2018-07-20: 3 mL via INTRAVENOUS

## 2018-07-20 MED ORDER — FENTANYL CITRATE (PF) 250 MCG/5ML IJ SOLN
INTRAMUSCULAR | Status: AC
  Filled 2018-07-20: qty 5

## 2018-07-20 MED ORDER — METHOCARBAMOL 1000 MG/10ML IJ SOLN
500.0000 mg | Freq: Four times a day (QID) | INTRAVENOUS | Status: DC | PRN
  Filled 2018-07-20: qty 5

## 2018-07-20 MED ORDER — POLYETHYLENE GLYCOL 3350 17 G PO PACK: 17.0000 g | PACK | Freq: Every day | ORAL | Status: DC

## 2018-07-20 MED ORDER — BUPIVACAINE-EPINEPHRINE (PF) 0.5% -1:200000 IJ SOLN
INTRAMUSCULAR | Status: AC
  Filled 2018-07-20: qty 30

## 2018-07-20 MED ORDER — MIDAZOLAM HCL 5 MG/5ML IJ SOLN
INTRAMUSCULAR | Status: DC | PRN
  Administered 2018-07-20: 2 mg via INTRAVENOUS

## 2018-07-20 MED ORDER — FENTANYL CITRATE (PF) 100 MCG/2ML IJ SOLN
INTRAMUSCULAR | Status: AC
  Administered 2018-07-20: 50 ug via INTRAVENOUS
  Filled 2018-07-20: qty 2

## 2018-07-20 MED ORDER — ONDANSETRON HCL 4 MG/2ML IJ SOLN
INTRAMUSCULAR | Status: DC | PRN
  Administered 2018-07-20: 4 mg via INTRAVENOUS

## 2018-07-20 MED ORDER — OXYCODONE HCL 5 MG PO TABS
5.0000 mg | ORAL_TABLET | ORAL | Status: DC | PRN
  Administered 2018-07-20 – 2018-07-21 (×4): 5 mg via ORAL
  Filled 2018-07-20 (×4): qty 1

## 2018-07-20 MED ORDER — VALACYCLOVIR HCL 500 MG PO TABS: 1000.0000 mg | ORAL_TABLET | ORAL | Status: DC

## 2018-07-20 SURGICAL SUPPLY — 60 items
AGENT HMST KT MTR STRL THRMB (HEMOSTASIS) ×1
APL SKNCLS STERI-STRIP NONHPOA (GAUZE/BANDAGES/DRESSINGS) ×1
BENZOIN TINCTURE PRP APPL 2/3 (GAUZE/BANDAGES/DRESSINGS) ×3 IMPLANT
BIT DRILL SKYLINE 12MM (BIT) IMPLANT
BONE CERV LORDOTIC 14.5X12X8 (Bone Implant) ×3 IMPLANT
BUR ROUND FLUTED 4 SOFT TCH (BURR) ×1 IMPLANT
BUR ROUND FLUTED 4MM SOFT TCH (BURR) ×1
CLOSURE STERI-STRIP 1/2X4 (GAUZE/BANDAGES/DRESSINGS) ×1
CLOSURE WOUND 1/2 X4 (GAUZE/BANDAGES/DRESSINGS) ×1
CLSR STERI-STRIP ANTIMIC 1/2X4 (GAUZE/BANDAGES/DRESSINGS) ×1 IMPLANT
COLLAR CERV LO CONTOUR FIRM DE (SOFTGOODS) ×3 IMPLANT
COVER MAYO STAND STRL (DRAPES) ×3 IMPLANT
COVER SURGICAL LIGHT HANDLE (MISCELLANEOUS) ×3 IMPLANT
COVER WAND RF STERILE (DRAPES) ×3 IMPLANT
CRADLE DONUT ADULT HEAD (MISCELLANEOUS) ×3 IMPLANT
DECANTER SPIKE VIAL GLASS SM (MISCELLANEOUS) ×2 IMPLANT
DRAPE C-ARM 42X72 X-RAY (DRAPES) ×3 IMPLANT
DRAPE HALF SHEET 40X57 (DRAPES) ×3 IMPLANT
DRAPE MICROSCOPE LEICA (MISCELLANEOUS) ×3 IMPLANT
DRILL BIT SKYLINE 12MM (BIT) ×3
DURAPREP 6ML APPLICATOR 50/CS (WOUND CARE) ×3 IMPLANT
ELECT COATED BLADE 2.86 ST (ELECTRODE) ×3 IMPLANT
ELECT REM PT RETURN 9FT ADLT (ELECTROSURGICAL) ×3
ELECTRODE REM PT RTRN 9FT ADLT (ELECTROSURGICAL) ×1 IMPLANT
EVACUATOR 1/8 PVC DRAIN (DRAIN) ×3 IMPLANT
GAUZE SPONGE 4X4 12PLY STRL (GAUZE/BANDAGES/DRESSINGS) ×3 IMPLANT
GLOVE BIOGEL PI IND STRL 8 (GLOVE) ×2 IMPLANT
GLOVE BIOGEL PI INDICATOR 8 (GLOVE) ×4
GLOVE ORTHO TXT STRL SZ7.5 (GLOVE) ×8 IMPLANT
GOWN STRL REUS W/ TWL LRG LVL3 (GOWN DISPOSABLE) ×1 IMPLANT
GOWN STRL REUS W/ TWL XL LVL3 (GOWN DISPOSABLE) ×1 IMPLANT
GOWN STRL REUS W/TWL 2XL LVL3 (GOWN DISPOSABLE) ×3 IMPLANT
GOWN STRL REUS W/TWL LRG LVL3 (GOWN DISPOSABLE) ×6
GOWN STRL REUS W/TWL XL LVL3 (GOWN DISPOSABLE) ×3
GRAFT BNE SPCR VG2 14.5X12X8 (Bone Implant) IMPLANT
HEAD HALTER (SOFTGOODS) ×3 IMPLANT
KIT BASIN OR (CUSTOM PROCEDURE TRAY) ×3 IMPLANT
KIT TURNOVER KIT B (KITS) ×3 IMPLANT
MANIFOLD NEPTUNE II (INSTRUMENTS) ×3 IMPLANT
NDL 25GX 5/8IN NON SAFETY (NEEDLE) ×1 IMPLANT
NEEDLE 25GX 5/8IN NON SAFETY (NEEDLE) ×3 IMPLANT
NS IRRIG 1000ML POUR BTL (IV SOLUTION) ×3 IMPLANT
PACK ORTHO CERVICAL (CUSTOM PROCEDURE TRAY) ×3 IMPLANT
PAD ARMBOARD 7.5X6 YLW CONV (MISCELLANEOUS) ×6 IMPLANT
PIN TEMP SKYLINE THREADED (PIN) ×2 IMPLANT
PLATE ONE LEVEL SKYLINE 14MM (Plate) ×2 IMPLANT
SCREW VARIABLE SELF TAP 12MM (Screw) ×8 IMPLANT
SPONGE INTESTINAL PEANUT (DISPOSABLE) ×2 IMPLANT
SPONGE LAP 18X18 X RAY DECT (DISPOSABLE) ×2 IMPLANT
STRIP CLOSURE SKIN 1/2X4 (GAUZE/BANDAGES/DRESSINGS) ×2 IMPLANT
SURGIFLO W/THROMBIN 8M KIT (HEMOSTASIS) ×2 IMPLANT
SUT BONE WAX W31G (SUTURE) ×3 IMPLANT
SUT VIC AB 3-0 PS2 18 (SUTURE) ×3
SUT VIC AB 3-0 PS2 18XBRD (SUTURE) ×1 IMPLANT
SUT VIC AB 4-0 PS2 27 (SUTURE) ×3 IMPLANT
SYR BULB 3OZ (MISCELLANEOUS) ×3 IMPLANT
TAPE CLOTH SURG 4X10 WHT LF (GAUZE/BANDAGES/DRESSINGS) ×2 IMPLANT
TOWEL OR 17X24 6PK STRL BLUE (TOWEL DISPOSABLE) ×5 IMPLANT
TOWEL OR 17X26 10 PK STRL BLUE (TOWEL DISPOSABLE) ×3 IMPLANT
WATER STERILE IRR 1000ML POUR (IV SOLUTION) ×3 IMPLANT
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
## 2018-07-20 NOTE — Anesthesia Postprocedure Evaluation (Signed)
Anesthesia Post Note  Patient: ANTANETTE RICHWINE  Procedure(s) Performed: c4-5ANTERIOR CERVICAL DECOMPRESSION/DISCECTOMY FUSION, ALLOGRAFT & PLATE (N/A Spine Cervical)     Patient location during evaluation: PACU Anesthesia Type: General Level of consciousness: awake and alert Pain management: pain level controlled Vital Signs Assessment: post-procedure vital signs reviewed and stable Respiratory status: spontaneous breathing, nonlabored ventilation, respiratory function stable and patient connected to nasal cannula oxygen Cardiovascular status: blood pressure returned to baseline and stable Postop Assessment: no apparent nausea or vomiting Anesthetic complications: no    Last Vitals:  Vitals:   07/20/18 1649 07/20/18 1918  BP: (!) 146/78 (!) 150/91  Pulse: 66 95  Resp: 18 20  Temp: (!) 36.4 C 36.4 C  SpO2: 98% 98%    Last Pain:  Vitals:   07/20/18 1948  TempSrc:   PainSc: 4                  Catalina Gravel

## 2018-07-20 NOTE — Progress Notes (Signed)
Orthopedic Tech Progress Note Patient Details:  Molly Klein 17-Feb-1960 [id number] Extra collar for the shower  Ortho Devices Type of Ortho Device: Soft collar Ortho Device/Splint Location: neck Ortho Device/Splint Interventions: Other (comment)   Post Interventions Patient Tolerated: Other (comment) Instructions Provided: Other (comment)   [name] 07/20/2018, 5:04 PM

## 2018-07-20 NOTE — Transfer of Care (Signed)
Immediate Anesthesia Transfer of Care Note  Patient: Molly Klein  Procedure(s) Performed: c4-5ANTERIOR CERVICAL DECOMPRESSION/DISCECTOMY FUSION, ALLOGRAFT & PLATE (N/A Spine Cervical)  Patient Location: PACU  Anesthesia Type:General  Level of Consciousness: awake, alert  and oriented  Airway & Oxygen Therapy: Patient Spontanous Breathing and Patient connected to nasal cannula oxygen  Post-op Assessment: Report given to RN and Post -op Vital signs reviewed and stable  Post vital signs: Reviewed and stable  Last Vitals:  Vitals Value Taken Time  BP 147/126 07/20/18 1521  Temp    Pulse 77 07/20/18 1522  Resp 14 07/20/18 1522  SpO2 100 % 07/20/18 1522  Vitals shown include unvalidated device data.  Last Pain:  Vitals:   07/20/18 1037  TempSrc:   PainSc: 0-No pain      Patients Stated Pain Goal: 4 (41/59/73 3125)  Complications: No apparent anesthesia complications

## 2018-07-20 NOTE — Anesthesia Procedure Notes (Signed)
Procedure Name: Intubation Date/Time: 07/20/2018 12:45 PM Performed by: Neldon Newport, CRNA Pre-anesthesia Checklist: Timeout performed, Patient being monitored, Suction available, Emergency Drugs available and Patient identified Patient Re-evaluated:Patient Re-evaluated prior to induction Oxygen Delivery Method: Circle system utilized Preoxygenation: Pre-oxygenation with 100% oxygen Induction Type: IV induction Ventilation: Mask ventilation without difficulty, Oral airway inserted - appropriate to patient size and Two handed mask ventilation required Laryngoscope Size: Glidescope and 3 Grade View: Grade I Tube type: Oral Tube size: 7.0 mm Number of attempts: 1 Placement Confirmation: breath sounds checked- equal and bilateral,  positive ETCO2 and ETT inserted through vocal cords under direct vision Secured at: 21 cm Tube secured with: Tape Dental Injury: Teeth and Oropharynx as per pre-operative assessment

## 2018-07-20 NOTE — Op Note (Signed)
Preop diagnosis: C4-5 central disc protrusion with cord flattening  Postop diagnosis: Same  Procedure: C4-5 anterior cervical discectomy and fusion, allograft and plate.  Surgeon: Rodell Perna, MD  Assistant: Benjiman Core, PA-C medically necessary and present for the entire procedure  Anesthesia: General oral tracheal plus marcaine skin local 6cc  EBL: 200 mL  Drains: 1 Hemovac neck  Implants:Depuy skyline 14 mm plate.  12 mm self-tapping screws x4. VG 2 cortical cancellus allograft 8 mm height.  Procedure: After induction general anesthesia orotracheal ovation arms tucked at the side with yellow foam pads over the ulnar nerve a gel roll was placed behind the scapula.  Neck was prepped with DuraPrep after had ultra traction without weight.  Area squared with towel sterile skin marker Betadine Steri-Drape sterile female standard the head and thyroid sheets and drapes were applied.  Timeout procedure.  3 g Ancef prophylaxis.  Incision was made at the midline extending to the left based on palpable landmarks.  Platysma was divided in line with the skin incision.  Blunt dissection was performed above the omohyoid down to the midline.  There was a transverse vein that was coagulated.  Longus Lenon Curt was elevated right and left prominent spur was noted and a short 25 needle was placed in the C4-5 level confirmed with sterilely draped lateral C arm image.  Disc was marked taking a chunk of disc out and then self-retaining tractors were placed with teeth blades right and left smooth blades cephalad caudad.  Prominent anterior spurs were removed with a very rondure and the bur.  Operative microscope was draped and brought in and discectomy was performed progressing back to the posterior longitudinal ligament.  There was a large central calcified disc protrusion which had extruded through the posterior longitudinal ligament and was adherent to the dura.  Titanium microdissection instruments were required and  there was central cord flattening with a central depression like a trough.  I had to take an additional millimeter bone on both C4 and C5 level to get around the ends of the fragment and then using the operative microscope and titanium micro scaption tools gently peeled that off the dura without creating a dural tear.  Fragment was removed completely excised and this calcified piece represented approximately 50% of the disc that extruded through the posterior longitudinal ligament that was causing protrusion.  The remaining portion was loose and was easily removed.  1 and 2 mm Kerrisons were used to remove the posterior spurs and uncovertebral joints were stripped on each side right and left with acquired curettes.  Trial sizers was 6 7 and then 8 mm sizer showed a gave a nice fit endplates had been briefly used with the bur and hand curetted with required curettes.  There was good fit with a graft.  Traction was pulled by the CRNA and the 8 mm cortical cancellus allograft was countersunk 2 mm was secured.  Additional touchup to remove spurs along the front so that the 14 mm plate would sit flat.  Once it was position was held with a small spike AP lateral C-arm was used to confirm it was in good position exactly midline and in good position laterally.  Screws were then placed checked under fluoroscopy AP and lateral confirmed all 4 were in good position graft was in good position and the screws were locked down with a tiny locking screwdriver.  All screws were flushed with the plate.  Careful inspection showed no residual bleeding.  During the procedure with  removal of additional bone due to the calcified disc fragment there was endplate bleeding and some bone wax had been used on this and then had to be scraped off and will slightly more bone removed before the allograft was placed.  Some Surgi-Flo is been used down on the dura for epidural veins and epidural vein on the right side was coagulated with the bipolar  setting at 10 which controlled the epidural vein.  At the time of closure operative field was dry and there was no residual bleeding.  Hemovacs placed with a technique in line with skin incision closure of 3 oh platysma reapproximation interrupted suture 4-0 Vicryl subcuticular closure tincture benzoin Steri-Strips 4 x 4's tape and soft collar with collar extension to the patient's body habitus was applied.  Patient was transferred recovery room in stable condition.

## 2018-07-20 NOTE — Interval H&P Note (Signed)
History and Physical Interval Note:  07/20/2018 12:28 PM  Molly Klein  has presented today for surgery, with the diagnosis of c4-5 protrusion, cervical stenosis.  The various methods of treatment have been discussed with the patient and family. After consideration of risks, benefits and other options for treatment, the patient has consented to  Procedure(s): c4-5ANTERIOR CERVICAL DECOMPRESSION/DISCECTOMY FUSION, ALLOGRAFT & PLATE (N/A) as a surgical intervention.  The patient's history has been reviewed, patient examined, no change in status, stable for surgery.  I have reviewed the patient's chart and labs.  Questions were answered to the patient's satisfaction.     Marybelle Killings

## 2018-07-20 NOTE — Plan of Care (Signed)
Patient admitted and in stable condition.

## 2018-07-21 ENCOUNTER — Inpatient Hospital Stay: Payer: Medicare HMO | Admitting: Family Medicine

## 2018-07-21 DIAGNOSIS — M50221 Other cervical disc displacement at C4-C5 level: Secondary | ICD-10-CM | POA: Diagnosis not present

## 2018-07-21 LAB — BASIC METABOLIC PANEL
Anion gap: 10 (ref 5–15)
BUN: 9 mg/dL (ref 6–20)
CO2: 26 mmol/L (ref 22–32)
Calcium: 8.9 mg/dL (ref 8.9–10.3)
Chloride: 101 mmol/L (ref 98–111)
Creatinine, Ser: 0.75 mg/dL (ref 0.44–1.00)
GFR calc Af Amer: >60 mL/min (ref >60)
GFR calc non Af Amer: >60 mL/min (ref >60)
Glucose, Bld: 259 mg/dL — ABNORMAL HIGH (ref 70–99)
Potassium: 4.4 mmol/L (ref 3.5–5.1)
Sodium: 137 mmol/L (ref 135–145)

## 2018-07-21 MED ORDER — METHOCARBAMOL 500 MG PO TABS: 500.0000 mg | ORAL_TABLET | Freq: Four times a day (QID) | ORAL | 0 refills | Status: DC | PRN

## 2018-07-21 MED ORDER — OXYCODONE-ACETAMINOPHEN 5-325 MG PO TABS: 1.0000 | ORAL_TABLET | ORAL | 0 refills | Status: DC | PRN

## 2018-07-21 NOTE — Plan of Care (Signed)
Uneventful night. Mobilized.

## 2018-07-21 NOTE — Discharge Instructions (Signed)
Keep collar on at all times. Apply the extra collar with saran wrap around it when you shower, dry off after shower then reapply the dry collar.  You may be more comfortable sleeping in a recliner position for about a week to decrease swelling in your neck from surgery. Stay with soft foods for a couple weeks until swelling is down and it is easier to swallow. See Dr. Lorin Mercy in one week

## 2018-07-21 NOTE — Progress Notes (Signed)
Patient alert and oriented, mae's well, voiding adequate amount of urine, swallowing without difficulty, no c/o pain at time of discharge. Patient discharged home with family. Script and discharged instructions given to patient. Patient and family stated understanding of instructions given. Patient has an appointment with Dr. Yates  

## 2018-07-21 NOTE — Plan of Care (Signed)
Patient discharged home.

## 2018-07-21 NOTE — Progress Notes (Signed)
Inpatient Diabetes Program Recommendations  AACE/ADA: New Consensus Statement on Inpatient Glycemic Control (2015)  Target Ranges:  Prepandial:   less than 140 mg/dL      Peak postprandial:   less than 180 mg/dL (1-2 hours)      Critically ill patients:  140 - 180 mg/dL   Lab Results  Component Value Date   GLUCAP 105 (H) 10/30/2008   HGBA1C 7.2 (H) 07/20/2018    Review of Glycemic Control  Inpatient Diabetes Program Recommendations:   Patient was just discharged so unable to speak to patient @ bedside. Called patient by phone and gave result of A1c 7.2 and requested patient to followup with PCP.  Thank you, Nani Gasser. Chisa Kushner, RN, MSN, CDE  Diabetes Coordinator Inpatient Glycemic Control Team Team Pager [phone] (8am-5pm) 07/21/2018 12:32 PM

## 2018-07-21 NOTE — Progress Notes (Signed)
   Subjective: 1 Day Post-Op Procedure(s) (LRB): c4-5ANTERIOR CERVICAL DECOMPRESSION/DISCECTOMY FUSION, ALLOGRAFT & PLATE (N/A) Patient reports pain as mild.    Objective: Vital signs in last 24 hours: Temp:  [97 F (36.1 C)-99 F (37.2 C)] 98.1 F (36.7 C) (06/18 0440) Pulse Rate:  [65-95] 77 (06/18 0440) Resp:  [13-20] 20 (06/18 0440) BP: (146-187)/(78-117) 153/95 (06/18 0440) SpO2:  [92 %-100 %] 100 % (06/18 0440) Weight:  [121.1 kg] 121.1 kg (06/17 1650)  Intake/Output from previous day: 06/17 0701 - 06/18 0700 In: 1400 [I.V.:1350] Out: 300 [Blood:300] Intake/Output this shift: No intake/output data recorded.  No results for input(s): HGB in the last 72 hours. No results for input(s): WBC, RBC, HCT, PLT in the last 72 hours. Recent Labs    07/21/18 0616  NA 137  K 4.4  CL 101  CO2 26  BUN 9  CREATININE 0.75  GLUCOSE 259*  CALCIUM 8.9   No results for input(s): LABPT, INR in the last 72 hours.  Neurologically intact Dg Cervical Spine 2-3 Views  Result Date: 07/20/2018 CLINICAL DATA:  C4-5 ACDF EXAM: CERVICAL SPINE - 2-3 VIEW; DG C-ARM 61-120 MIN COMPARISON:  None. FINDINGS: Three fluoroscopic images are provided, pain during C4-5 ACDF. Fluoroscopy time reported as 12 seconds. IMPRESSION: Intraoperative fluoroscopy. Electronically Signed   By: Ulyses Jarred M.D.   On: 07/20/2018 17:06   Dg C-arm 1-60 Min  Result Date: 07/20/2018 CLINICAL DATA:  C4-5 ACDF EXAM: CERVICAL SPINE - 2-3 VIEW; DG C-ARM 61-120 MIN COMPARISON:  None. FINDINGS: Three fluoroscopic images are provided, pain during C4-5 ACDF. Fluoroscopy time reported as 12 seconds. IMPRESSION: Intraoperative fluoroscopy. Electronically Signed   By: Ulyses Jarred M.D.   On: 07/20/2018 17:06    Assessment/Plan: 1 Day Post-Op Procedure(s) (LRB): c4-5ANTERIOR CERVICAL DECOMPRESSION/DISCECTOMY FUSION, ALLOGRAFT & PLATE (N/A) Plan:  Discharge home office one week. Extra collar for shower.   Molly Klein  07/21/2018, 7:52 AM

## 2018-07-22 ENCOUNTER — Encounter (HOSPITAL_COMMUNITY): Payer: Self-pay | Admitting: Orthopaedic Surgery

## 2018-07-25 ENCOUNTER — Ambulatory Visit (INDEPENDENT_AMBULATORY_CARE_PROVIDER_SITE_OTHER): Payer: Medicare HMO | Admitting: Family Medicine

## 2018-07-25 ENCOUNTER — Telehealth: Payer: Self-pay | Admitting: Family Medicine

## 2018-07-25 ENCOUNTER — Other Ambulatory Visit: Payer: Self-pay | Admitting: Family Medicine

## 2018-07-25 ENCOUNTER — Other Ambulatory Visit: Payer: Self-pay

## 2018-07-25 ENCOUNTER — Encounter: Payer: Self-pay | Admitting: Family Medicine

## 2018-07-25 VITALS — Ht 63.0 in

## 2018-07-25 DIAGNOSIS — E1165 Type 2 diabetes mellitus with hyperglycemia: Secondary | ICD-10-CM

## 2018-07-25 DIAGNOSIS — R69 Illness, unspecified: Secondary | ICD-10-CM | POA: Diagnosis not present

## 2018-07-25 MED ORDER — BLOOD GLUCOSE MONITOR KIT: PACK | 0 refills | Status: DC

## 2018-07-25 MED ORDER — METFORMIN HCL 500 MG PO TABS: 500.0000 mg | ORAL_TABLET | Freq: Two times a day (BID) | ORAL | 3 refills | Status: DC

## 2018-07-25 NOTE — Progress Notes (Signed)
Virtual Visit via Telephone Note  I connected with Molly Klein on 07/25/18 at  9:00 AM EDT by telephone and verified that I am speaking with the correct person using two identifiers.  Location: Patient: home  Provider: home    I discussed the limitations, risks, security and privacy concerns of performing an evaluation and management service by telephone and the availability of in person appointments. I also discussed with the patient that there may be a patient responsible charge related to this service. The patient expressed understanding and agreed to proceed.   History of Present Illness: Pt is home -- she was recently in the hospital for neck surgery-- fusion.  Her a1c was found to be elevated.  Pt was advised to f/u with [place].  Pt has no new complaints.   Lab Results  Component Value Date   HGBA1C 7.2 (H) 07/20/2018   Past Medical History:  Diagnosis Date  . Asthma   . Bruising 10/21/2012  . Cervical spinal stenosis    L4-5  . DVT (deep venous thrombosis) ([place])   . GERD (gastroesophageal reflux disease)   . Hepatitis C    Treated and cured per pt  . History of DVT of lower extremity 10/21/2012  . Hypertension   . Knee pain    left   . Neuromuscular disorder ([facility])    neuropathy lt arm  . Nipple discharge    right breast  . OSA (obstructive sleep apnea)    Current Outpatient Medications on File Prior to Visit  Medication Sig Dispense Refill  . albuterol (PROVENTIL HFA;VENTOLIN HFA) 108 (90 Base) MCG/ACT inhaler Inhale 2 puffs into the lungs 4 (four) times daily as needed. Shortness of breath (Patient taking differently: Inhale 2 puffs into the lungs 4 (four) times daily as needed for wheezing or shortness of breath. ) 1 Inhaler 6  . albuterol (PROVENTIL) (2.5 MG/3ML) 0.083% nebulizer solution INHALE 1 VIAL BY NEBULIZER AS DIRECTED (Patient taking differently: Take 2.5 mg by nebulization 4 (four) times daily as needed for wheezing or shortness of breath. ) 75 mL 0  . Ca  Carbonate-Mag Hydroxide (ROLAIDS PO) Take 1 tablet by mouth every 6 (six) hours as needed (heartburn).    . methocarbamol (ROBAXIN) 500 MG tablet Take 1 tablet (500 mg total) by mouth every 6 (six) hours as needed for muscle spasms. 30 tablet 0  . valACYclovir (VALTREX) 1000 MG tablet 1 tablet by mouth tid x 7 days then 1 po qd (Patient taking differently: Take 1,000 mg by mouth See admin instructions. Take 1079m by mouth three times a day as needed for cold sores.) 42 tablet 5  . budesonide-formoterol (SYMBICORT) 160-4.5 MCG/ACT inhaler Inhale 2 puffs into the lungs 2 (two) times daily. (Patient not taking: Reported on 07/13/2018) 1 Inhaler 6  . oxyCODONE-acetaminophen (PERCOCET) 5-325 MG tablet Take 1 tablet by mouth every 4 (four) hours as needed for severe pain. (Patient not taking: Reported on 07/25/2018) 40 tablet 0   No current facility-administered medications on file prior to visit.       Observations/Objective: No vitals obtained Pt is sore but moving around ok since surgery No other complaints Assessment and Plan:  1. Uncontrolled type 2 diabetes mellitus with hyperglycemia ([place] Will start medication  Pt will need glucometer and diabetic ed F/u 3 months or sooner prn  Fasting bs as close to 100 as possible without being symptomatic --- 2 h pp--  Under 150--- bs 198 with hosp labs  Will refer to  nutrition if needed once she is able to move around better  - metFORMIN (GLUCOPHAGE) 500 MG tablet; Take 1 tablet (500 mg total) by mouth 2 (two) times daily with a meal.  Dispense: 90 tablet; Refill: 3 - blood glucose meter kit and supplies KIT; Dispense based on patient and insurance preference. Use up to four times daily as directed. (FOR ICD-9 250.00, 250.01).  Dispense: 1 each; Refill: 0  Follow Up Instructions:    I discussed the assessment and treatment plan with the patient. The patient was provided an opportunity to ask questions and all were answered. The patient agreed with  the plan and demonstrated an understanding of the instructions.   The patient was advised to call back or seek an in-person evaluation if the symptoms worsen or if the condition fails to improve as anticipated.  I provided 25 minutes of non-face-to-face time during this encounter.   [name], DO

## 2018-07-25 NOTE — Telephone Encounter (Signed)
Spoke w/ Pt- she stated the pharmacy didn't have script for meter or supplies. Informed I was faxing over to them now. Pt verbalized understanding.

## 2018-07-25 NOTE — Telephone Encounter (Signed)
Medication: blood glucose meter kit and supplies KIT [[id number]] - pt states that pharmacy did not have her test strips please advise. She states she just had surgery and does not want to make a second trip.  Has the patient contacted their pharmacy? Yes  (Agent: If no, request that the patient contact the pharmacy for the refill.) (Agent: If yes, when and what did the pharmacy advise?)  Preferred Pharmacy (with phone number or street name): [id number] [address], [place] AT [address] [place] [address] Phone: [phone] Fax: [phone]    Agent: Please be advised that RX refills may take up to 3 business days. We ask that you follow-up with your pharmacy.

## 2018-07-25 NOTE — Telephone Encounter (Signed)
LVM for pt to call the office to schedule NV for DM education and also Pt needs in office visit in 3 months fu appt also.

## 2018-07-28 DIAGNOSIS — G4733 Obstructive sleep apnea (adult) (pediatric): Secondary | ICD-10-CM | POA: Diagnosis not present

## 2018-07-28 NOTE — Discharge Summary (Signed)
Patient ID: Molly Klein MRN: [id number] DOB/AGE: 60-03-1960 58 y.o.  Admit date: 07/20/2018 Discharge date: 07/28/2018  Admission Diagnoses:  Active Problems:   Cervical spinal stenosis   Discharge Diagnoses:  Active Problems:   Cervical spinal stenosis  status post Procedure(s): c4-5ANTERIOR CERVICAL DECOMPRESSION/DISCECTOMY FUSION, ALLOGRAFT & PLATE  Past Medical History:  Diagnosis Date  . Asthma   . Bruising 10/21/2012  . Cervical spinal stenosis    L4-5  . DVT (deep venous thrombosis) ([place])   . GERD (gastroesophageal reflux disease)   . Hepatitis C    Treated and cured per pt  . History of DVT of lower extremity 10/21/2012  . Hypertension   . Knee pain    left   . Neuromuscular disorder (HCC)    neuropathy lt arm  . Nipple discharge    right breast  . OSA (obstructive sleep apnea)     Surgeries: Procedure(s): c4-5ANTERIOR CERVICAL DECOMPRESSION/DISCECTOMY FUSION, ALLOGRAFT & PLATE on 09/29/32   Consultants:   Discharged Condition: Improved  Hospital Course: Molly Klein is an 58 y.o. female who was admitted 07/20/2018 for operative treatment of cervical stenosis. Patient failed conservative treatments (please see the history and physical for the specifics) and had severe unremitting pain that affects sleep, daily activities and work/hobbies. After pre-op clearance, the patient was taken to the operating room on 07/20/2018 and underwent  Procedure(s): c4-5ANTERIOR CERVICAL DECOMPRESSION/DISCECTOMY FUSION, ALLOGRAFT & PLATE.    Patient was given perioperative antibiotics:  Anti-infectives (From admission, onward)   Start     Dose/Rate Route Frequency Ordered Stop   07/20/18 2100  ceFAZolin (ANCEF) IVPB 1 g/50 mL premix     1 g 100 mL/hr over 30 Minutes Intravenous Every 8 hours 07/20/18 1638 07/21/18 0550   07/20/18 1638  valACYclovir (VALTREX) tablet 1,000 mg  Status:  Discontinued     1,000 mg Oral See admin instructions 07/20/18 1638 07/20/18  1640   07/20/18 1400  ceFAZolin (ANCEF) 3 g in dextrose 5 % 50 mL IVPB     3 g 100 mL/hr over 30 Minutes Intravenous To [facility] 07/19/18 1137 07/20/18 1746       Patient was given sequential compression devices and early ambulation to prevent DVT.   Patient benefited maximally from hospital stay and there were no complications. At the time of discharge, the patient was urinating/moving their bowels without difficulty, tolerating a regular diet, pain is controlled with oral pain medications and they have been cleared by PT/OT.   Recent vital signs: No data found.   Recent laboratory studies: No results for input(s): WBC, HGB, HCT, PLT, NA, K, CL, CO2, BUN, CREATININE, GLUCOSE, INR, CALCIUM in the last 72 hours.  Invalid input(s): PT, 2   Discharge Medications:   Allergies as of 07/21/2018      Reactions   Codeine Nausea And Vomiting   Interferon Beta-1a Nausea And Vomiting, Other (See Comments)   Severe flu-like symptoms Symptoms expected   Sulfonamide Derivatives Nausea Only      Medication List    TAKE these medications   albuterol 108 (90 Base) MCG/ACT inhaler Commonly known as: VENTOLIN HFA Inhale 2 puffs into the lungs 4 (four) times daily as needed. Shortness of breath What changed:   reasons to take this  additional instructions   albuterol (2.5 MG/3ML) 0.083% nebulizer solution Commonly known as: PROVENTIL INHALE 1 VIAL BY NEBULIZER AS DIRECTED What changed:   how much to take  how to take this  when to take  this  reasons to take this  additional instructions   budesonide-formoterol 160-4.5 MCG/ACT inhaler Commonly known as: SYMBICORT Inhale 2 puffs into the lungs 2 (two) times daily.   methocarbamol 500 MG tablet Commonly known as: ROBAXIN Take 1 tablet (500 mg total) by mouth every 6 (six) hours as needed for muscle spasms.   oxyCODONE-acetaminophen 5-325 MG tablet Commonly known as: Percocet Take 1 tablet by mouth every 4 (four)  hours as needed for severe pain.   ROLAIDS PO Take 1 tablet by mouth every 6 (six) hours as needed (heartburn).   valACYclovir 1000 MG tablet Commonly known as: Valtrex 1 tablet by mouth tid x 7 days then 1 po qd What changed:   how much to take  how to take this  when to take this  additional instructions       Diagnostic Studies: Dg Cervical Spine 2-3 Views  Result Date: 07/20/2018 CLINICAL DATA:  C4-5 ACDF EXAM: CERVICAL SPINE - 2-3 VIEW; DG C-ARM 61-120 MIN COMPARISON:  None. FINDINGS: Three fluoroscopic images are provided, pain during C4-5 ACDF. Fluoroscopy time reported as 12 seconds. IMPRESSION: Intraoperative fluoroscopy. Electronically Signed   By: Ulyses Jarred M.D.   On: 07/20/2018 17:06   Dg C-arm 1-60 Min  Result Date: 07/20/2018 CLINICAL DATA:  C4-5 ACDF EXAM: CERVICAL SPINE - 2-3 VIEW; DG C-ARM 61-120 MIN COMPARISON:  None. FINDINGS: Three fluoroscopic images are provided, pain during C4-5 ACDF. Fluoroscopy time reported as 12 seconds. IMPRESSION: Intraoperative fluoroscopy. Electronically Signed   By: Ulyses Jarred M.D.   On: 07/20/2018 17:06      Follow-up Information    Marybelle Killings, MD Follow up in 1 week(s).   Specialty: Orthopedic Surgery Contact information: [place] [address] [phone]           Discharge Plan:  discharge to home  Disposition:     Signed: Benjiman Core for mark yates MD 07/28/2018, 11:02 AM

## 2018-07-29 ENCOUNTER — Encounter: Payer: Self-pay | Admitting: Orthopaedic Surgery

## 2018-07-29 ENCOUNTER — Other Ambulatory Visit: Payer: Self-pay

## 2018-07-29 ENCOUNTER — Ambulatory Visit (INDEPENDENT_AMBULATORY_CARE_PROVIDER_SITE_OTHER): Payer: Medicare HMO | Admitting: Orthopaedic Surgery

## 2018-07-29 ENCOUNTER — Ambulatory Visit (INDEPENDENT_AMBULATORY_CARE_PROVIDER_SITE_OTHER): Payer: Medicare HMO

## 2018-07-29 VITALS — Ht 63.0 in | Wt 266.0 lb

## 2018-07-29 DIAGNOSIS — Z981 Arthrodesis status: Secondary | ICD-10-CM

## 2018-07-29 MED ORDER — METHOCARBAMOL 500 MG PO TABS: 500.0000 mg | ORAL_TABLET | Freq: Three times a day (TID) | ORAL | 0 refills | Status: DC | PRN

## 2018-07-29 NOTE — Progress Notes (Signed)
Post-Op Visit Note   Patient: Molly Klein           Date of Birth: 1960/07/26           MRN: [id number] Visit Date: 07/29/2018 PCP: [name], DO   Assessment & Plan: Post C4-5 ACDF.  She is using ibuprofen as requested a final refill of Robaxin.  She is happy the surgical result good relief of her preop arm pain.  She still has some soreness in her shoulders.  She is walking daily with her new diagnosis of diabetes.  Return 5 weeks lateral flexion-extension C-spine x-ray on return.  Chief Complaint:  Chief Complaint  Patient presents with  . Neck - Routine Post Op    07/20/2018 C4-5 ACDF, Allograft, Plate   Visit Diagnoses:  1. Status post cervical spinal fusion     Plan: Continue to continue daily walking and work on weight loss to help with her diabetes.  Continue collar return 5 weeks lateral flexion-extension C-spine x-ray on return.  Follow-Up Instructions: Return in about 5 weeks (around 09/02/2018).   Orders:  Orders Placed This Encounter  Procedures  . XR Cervical Spine 2 or 3 views   No orders of the defined types were placed in this encounter.   Imaging: No results found.  PMFS History: Patient Active Problem List   Diagnosis Date Noted  . Cervical spinal stenosis 07/20/2018  . Spinal stenosis of cervical region 06/14/2018  . Pre-operative cardiovascular examination 04/07/2018  . Sleep apnea 04/07/2018  . Morbid obesity with body mass index (BMI) of 40.0 to 49.9 ([place]) 04/07/2018  . Grief counseling 03/17/2018  . Pre-op evaluation 03/16/2018  . Protrusion of cervical intervertebral disc 01/10/2018  . Right wrist injury 08/26/2014  . Left knee pain 08/26/2014  . Severe obesity (BMI >= 40) ([place]) 03/12/2014  . Popliteal pain 12/20/2013  . Contusion of left calf 12/13/2013  . Acute bronchitis with asthma with acute exacerbation 05/15/2013  . Bruising 10/21/2012  . History of DVT of lower extremity 10/21/2012  . Acute thoracic back pain  09/28/2012  . Sinus congestion 04/01/2012  . Solitary pulmonary nodule 10/07/2011  . Lichenoid keratosis 73/53/2992  . 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 Nipple discharge in female, right 02/04/2011  . UNSPECIFIED ESSENTIAL HYPERTENSION 02/06/2010  . STRESS INCONTINENCE 04/22/2009  . UTI 10/24/2008  . PATELLAR DISLOCATION, RIGHT 10/24/2008  . HEPATITIS C 10/08/2006  . SLEEP APNEA, OBSTRUCTIVE 10/08/2006  . NARCOLEPSY W/O CATAPLEXY 10/08/2006  . Mild persistent asthma 10/08/2006  . GERD 10/08/2006  . OVERACTIVE BLADDER 10/08/2006  . FIBROCYSTIC BREAST DISEASE 10/08/2006   Past Medical History:  Diagnosis Date  . Asthma   . Bruising 10/21/2012  . Cervical spinal stenosis    L4-5  . DVT (deep venous thrombosis) ([place])   . GERD (gastroesophageal reflux disease)   . Hepatitis C    Treated and cured per pt  . History of DVT of lower extremity 10/21/2012  . Hypertension   . Knee pain    left   . Neuromuscular disorder (HCC)    neuropathy lt arm  . Nipple discharge    right breast  . OSA (obstructive sleep apnea)     Family History  Problem Relation Age of Onset  . Heart attack Mother        mi in 81s  . Alcohol abuse Mother        cirrhosis of liver  . Heart attack Father  MI in 14s  . Rectal cancer Maternal Grandmother   . Cancer Maternal Grandmother        breast  . Cancer Sister        pt unaware of what kind    Past Surgical History:  Procedure Laterality Date  . ABDOMINAL HYSTERECTOMY    . ANTERIOR CERVICAL DECOMP/DISCECTOMY FUSION N/A 07/20/2018   Procedure: c4-5ANTERIOR CERVICAL DECOMPRESSION/DISCECTOMY FUSION, ALLOGRAFT & PLATE;  Surgeon: Marybelle Killings, MD;  Location: [facility];  Service: Orthopedics;  Laterality: N/A;  . BREAST BIOPSY  02/06/2011   Procedure: BREAST BIOPSY;  Surgeon: Earnstine Regal, MD;  Location: [facility];  Service: General;  Laterality: Right;  right breast biopsy  . CHOLECYSTECTOMY    . DILATION AND CURETTAGE OF  UTERUS    . FRACTURE SURGERY     fx lt arm  . Nipple repair    . PILONIDAL CYST EXCISION    . right knee surgery    . TONSILLECTOMY     Social History   Occupational History  . Occupation: retired    Fish farm manager: DISABLED  Tobacco Use  . Smoking status: Former Smoker    Years: 5.00    Types: Cigarettes    Quit date: 02/03/1975    Years since quitting: 43.5  . Smokeless tobacco: Never Used  . Tobacco comment: 1 pack per week  Substance and Sexual Activity  . Alcohol use: No    Alcohol/week: 0.0 standard drinks  . Drug use: No  . Sexual activity: Yes    Partners: Male

## 2018-07-30 DIAGNOSIS — R69 Illness, unspecified: Secondary | ICD-10-CM | POA: Diagnosis not present

## 2018-07-30 DIAGNOSIS — J45909 Unspecified asthma, uncomplicated: Secondary | ICD-10-CM | POA: Diagnosis not present

## 2018-08-27 ENCOUNTER — Other Ambulatory Visit: Payer: Self-pay | Admitting: Family Medicine

## 2018-08-27 DIAGNOSIS — G4733 Obstructive sleep apnea (adult) (pediatric): Secondary | ICD-10-CM | POA: Diagnosis not present

## 2018-08-29 DIAGNOSIS — J45909 Unspecified asthma, uncomplicated: Secondary | ICD-10-CM | POA: Diagnosis not present

## 2018-08-30 ENCOUNTER — Ambulatory Visit (INDEPENDENT_AMBULATORY_CARE_PROVIDER_SITE_OTHER): Payer: Medicare HMO

## 2018-08-30 ENCOUNTER — Ambulatory Visit (INDEPENDENT_AMBULATORY_CARE_PROVIDER_SITE_OTHER): Payer: Medicare HMO | Admitting: Orthopaedic Surgery

## 2018-08-30 DIAGNOSIS — Z981 Arthrodesis status: Secondary | ICD-10-CM

## 2018-08-30 DIAGNOSIS — R69 Illness, unspecified: Secondary | ICD-10-CM | POA: Diagnosis not present

## 2018-08-30 NOTE — Progress Notes (Signed)
Post-Op Visit Note   Patient: Molly Klein           Date of Birth: 02-21-1960           MRN: [id number] Visit Date: 08/30/2018 PCP: [name], DO   Assessment & Plan: Patient can discontinue her soft collar.  X-rays show good healing with a single level fusion without motion.  She is happy with the surgical result and can return on as-needed basis.  She can resume a walking program to work on weight loss and overall general health improvement.  Chief Complaint:  Chief Complaint  Patient presents with  . Neck - Follow-up    07/20/2018 C4-5 ACDF, Allograft, Plate   Visit Diagnoses:  1. Status post cervical spinal fusion     Plan: Discontinue collar, walking program return as needed.  Follow-Up Instructions: Return if symptoms worsen or fail to improve.   Orders:  Orders Placed This Encounter  Procedures  . XR Cervical Spine 2 or 3 views   No orders of the defined types were placed in this encounter.   Imaging: Xr Cervical Spine 2 Or 3 Views  Result Date: 08/30/2018 AP cervical spine x-ray and lateral flexion-extension x-rays are obtained.  This shows solid fusion at C4-5 without motion on flexion-extension. Impression: Post C4-5 fusion with incorporation of the graft partially and no motion on flexion-extension x-rays.   PMFS History: Patient Active Problem List   Diagnosis Date Noted  . Cervical spinal stenosis 07/20/2018  . Spinal stenosis of cervical region 06/14/2018  . Pre-operative cardiovascular examination 04/07/2018  . Sleep apnea 04/07/2018  . Morbid obesity with body mass index (BMI) of 40.0 to 49.9 ([place]) 04/07/2018  . Grief counseling 03/17/2018  . Pre-op evaluation 03/16/2018  . Protrusion of cervical intervertebral disc 01/10/2018  . Right wrist injury 08/26/2014  . Left knee pain 08/26/2014  . Severe obesity (BMI >= 40) ([place]) 03/12/2014  . Popliteal pain 12/20/2013  . Contusion of left calf 12/13/2013  . Acute bronchitis with  asthma with acute exacerbation 05/15/2013  . Bruising 10/21/2012  . History of DVT of lower extremity 10/21/2012  . Acute thoracic back pain 09/28/2012  . Sinus congestion 04/01/2012  . Solitary pulmonary nodule 10/07/2011  . Lichenoid keratosis 95/10/3265  . 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 Nipple discharge in female, right 02/04/2011  . UNSPECIFIED ESSENTIAL HYPERTENSION 02/06/2010  . STRESS INCONTINENCE 04/22/2009  . UTI 10/24/2008  . PATELLAR DISLOCATION, RIGHT 10/24/2008  . HEPATITIS C 10/08/2006  . SLEEP APNEA, OBSTRUCTIVE 10/08/2006  . NARCOLEPSY W/O CATAPLEXY 10/08/2006  . Mild persistent asthma 10/08/2006  . GERD 10/08/2006  . OVERACTIVE BLADDER 10/08/2006  . FIBROCYSTIC BREAST DISEASE 10/08/2006   Past Medical History:  Diagnosis Date  . Asthma   . Bruising 10/21/2012  . Cervical spinal stenosis    L4-5  . DVT (deep venous thrombosis) ([place])   . GERD (gastroesophageal reflux disease)   . Hepatitis C    Treated and cured per pt  . History of DVT of lower extremity 10/21/2012  . Hypertension   . Knee pain    left   . Neuromuscular disorder (HCC)    neuropathy lt arm  . Nipple discharge    right breast  . OSA (obstructive sleep apnea)     Family History  Problem Relation Age of Onset  . Heart attack Mother        mi in 53s  . Alcohol abuse Mother  cirrhosis of liver  . Heart attack Father        MI in 93s  . Rectal cancer Maternal Grandmother   . Cancer Maternal Grandmother        breast  . Cancer Sister        pt unaware of what kind    Past Surgical History:  Procedure Laterality Date  . ABDOMINAL HYSTERECTOMY    . ANTERIOR CERVICAL DECOMP/DISCECTOMY FUSION N/A 07/20/2018   Procedure: c4-5ANTERIOR CERVICAL DECOMPRESSION/DISCECTOMY FUSION, ALLOGRAFT & PLATE;  Surgeon: Marybelle Killings, MD;  Location: [facility];  Service: Orthopedics;  Laterality: N/A;  . BREAST BIOPSY  02/06/2011   Procedure: BREAST BIOPSY;  Surgeon: Earnstine Regal, MD;   Location: [facility];  Service: General;  Laterality: Right;  right breast biopsy  . CHOLECYSTECTOMY    . DILATION AND CURETTAGE OF UTERUS    . FRACTURE SURGERY     fx lt arm  . Nipple repair    . PILONIDAL CYST EXCISION    . right knee surgery    . TONSILLECTOMY     Social History   Occupational History  . Occupation: retired    Fish farm manager: DISABLED  Tobacco Use  . Smoking status: Former Smoker    Years: 5.00    Types: Cigarettes    Quit date: 02/03/1975    Years since quitting: 43.6  . Smokeless tobacco: Never Used  . Tobacco comment: 1 pack per week  Substance and Sexual Activity  . Alcohol use: No    Alcohol/week: 0.0 standard drinks  . Drug use: No  . Sexual activity: Yes    Partners: Male

## 2018-09-01 DIAGNOSIS — R69 Illness, unspecified: Secondary | ICD-10-CM | POA: Diagnosis not present

## 2018-09-02 ENCOUNTER — Ambulatory Visit: Payer: Medicare HMO | Admitting: Orthopaedic Surgery

## 2018-09-07 ENCOUNTER — Other Ambulatory Visit: Payer: Self-pay | Admitting: Family Medicine

## 2018-09-27 DIAGNOSIS — G4733 Obstructive sleep apnea (adult) (pediatric): Secondary | ICD-10-CM | POA: Diagnosis not present

## 2018-09-28 ENCOUNTER — Other Ambulatory Visit: Payer: Self-pay | Admitting: Family Medicine

## 2018-09-28 DIAGNOSIS — N6489 Other specified disorders of breast: Secondary | ICD-10-CM

## 2018-09-29 DIAGNOSIS — J45909 Unspecified asthma, uncomplicated: Secondary | ICD-10-CM | POA: Diagnosis not present

## 2018-10-13 ENCOUNTER — Ambulatory Visit: Payer: Medicare HMO | Admitting: Family Medicine

## 2018-10-13 ENCOUNTER — Other Ambulatory Visit: Payer: Self-pay

## 2018-10-13 ENCOUNTER — Other Ambulatory Visit: Payer: Self-pay | Admitting: Family Medicine

## 2018-10-13 ENCOUNTER — Encounter: Payer: Self-pay | Admitting: Family Medicine

## 2018-10-13 VITALS — BP 130/90 | HR 89 | Temp 97.8°F | Resp 18 | Wt 270.0 lb

## 2018-10-13 DIAGNOSIS — Z8619 Personal history of other infectious and parasitic diseases: Secondary | ICD-10-CM

## 2018-10-13 DIAGNOSIS — M7989 Other specified soft tissue disorders: Secondary | ICD-10-CM | POA: Diagnosis not present

## 2018-10-13 NOTE — Progress Notes (Addendum)
[facility], [place], [place] [address] [phone]  Date:  10/13/2018   Name:  Molly Klein   DOB:  01-06-1961   MRN:  [id number]  PCP:  [name], DO    Chief Complaint: Foot Pain (left foot pain, bruising, swelling)   History of Present Illness:  Molly Klein is a 58 y.o. very pleasant female patient who presents with the following:  Pt of Dr Etter Sjogren with history of obesity, DM Here today with concern of a problem with her left foot She has noticed both feet swelling, but more so her left foot, for the last 2 or 3 weeks.  Today she felt that her left foot appeared darker in color, which concerned her.  She notes some discomfort in her ankles, no known injury  She is taking metformin for diabetes, no other routine medications.  Her diabetes has been under reasonable control She reports that she had a blood clot in 2005- she was told that she has some sort of clotting disorder, but she has not been on blood thinners for the last 14 years or so.  It is unclear whether she truly has a clotting disorder Never had any other blood clots  Her swelling gets better overnight, comes back as the day goes on.  She has been propping her feet on a pillow while in bed which does seem to help She had a cardiac stress test done prior to neck surgery earlier this year;  The left ventricular ejection fraction is normal (55-65%).  Nuclear stress EF: 59%.  There was no ST segment deviation noted during stress.  The study is normal.  This is a low risk study.  She also had an echo in March of this year- normal   1. The left ventricle has normal systolic function with an ejection fraction of 60-65%. The cavity size was normal. Left ventricular diastolic parameters were normal.  2. The right ventricle has normal systolic function. The cavity was normal. There is no increase in right ventricular wall  thickness.  3. The aortic valve is tricuspid Aortic valve regurgitation was not assessed by color flow Doppler.  Lab Results  Component Value Date   HGBA1C 7.2 (H) 07/20/2018   BP Readings from Last 3 Encounters:  10/13/18 (!) 144/98  07/21/18 (!) 152/86  07/14/18 (!) 146/88   Pulse Readings from Last 3 Encounters:  10/13/18 89  07/21/18 81  07/14/18 74   Wt Readings from Last 3 Encounters:  10/13/18 270 lb (122.5 kg)  07/29/18 266 lb (120.7 kg)  07/20/18 266 lb 15.6 oz (121.1 kg)   I am unable to get a outpatient ultrasound of her legs today.  We have scheduled this test for tomorrow, but I advised patient that she can also go to the emergency room to have this done today.  We discussed potential risk of DVT reading to a pulmonary embolism, which can have serious or fatal consequences. We discussed a d-dimer, but given her history of blood clots I feel an ultrasound be necessary in any case  Molly Klein denies any chest pain, she does note chest tightness which is intermittent.  It is not present now.  She has noted this for about a month, symptoms are somewhat vague.  She recently had a thorough cardiac evaluation, but if positive for clot in her leg will likely need a CT angiogram  Patient Active Problem  List   Diagnosis Date Noted  . Cervical spinal stenosis 07/20/2018  . Spinal stenosis of cervical region 06/14/2018  . Pre-operative cardiovascular examination 04/07/2018  . Sleep apnea 04/07/2018  . Morbid obesity with body mass index (BMI) of 40.0 to 49.9 ([facility]) 04/07/2018  . Grief counseling 03/17/2018  . Pre-op evaluation 03/16/2018  . Protrusion of cervical intervertebral disc 01/10/2018  . Right wrist injury 08/26/2014  . Left knee pain 08/26/2014  . Severe obesity (BMI >= 40) ([facility]) 03/12/2014  . Popliteal pain 12/20/2013  . Contusion of left calf 12/13/2013  . Acute bronchitis with asthma with acute exacerbation 05/15/2013  . Bruising 10/21/2012  . History of DVT of  lower extremity 10/21/2012  . Acute thoracic back pain 09/28/2012  . Sinus congestion 04/01/2012  . Solitary pulmonary nodule 10/07/2011  . Lichenoid keratosis 64/68/0321  . 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 Nipple discharge in female, right 02/04/2011  . UNSPECIFIED ESSENTIAL HYPERTENSION 02/06/2010  . STRESS INCONTINENCE 04/22/2009  . UTI 10/24/2008  . PATELLAR DISLOCATION, RIGHT 10/24/2008  . HEPATITIS C 10/08/2006  . SLEEP APNEA, OBSTRUCTIVE 10/08/2006  . NARCOLEPSY W/O CATAPLEXY 10/08/2006  . Mild persistent asthma 10/08/2006  . GERD 10/08/2006  . OVERACTIVE BLADDER 10/08/2006  . FIBROCYSTIC BREAST DISEASE 10/08/2006    Past Medical History:  Diagnosis Date  . Asthma   . Bruising 10/21/2012  . Cervical spinal stenosis    L4-5  . DVT (deep venous thrombosis) ([facility])   . GERD (gastroesophageal reflux disease)   . Hepatitis C    Treated and cured per pt  . History of DVT of lower extremity 10/21/2012  . Hypertension   . Knee pain    left   . Neuromuscular disorder ([facility])    neuropathy lt arm  . Nipple discharge    right breast  . OSA (obstructive sleep apnea)     Past Surgical History:  Procedure Laterality Date  . ABDOMINAL HYSTERECTOMY    . ANTERIOR CERVICAL DECOMP/DISCECTOMY FUSION N/A 07/20/2018   Procedure: c4-5ANTERIOR CERVICAL DECOMPRESSION/DISCECTOMY FUSION, ALLOGRAFT & PLATE;  Surgeon: Marybelle Killings, MD;  Location: [facility];  Service: Orthopedics;  Laterality: N/A;  . BREAST BIOPSY  02/06/2011   Procedure: BREAST BIOPSY;  Surgeon: Earnstine Regal, MD;  Location: [facility];  Service: General;  Laterality: Right;  right breast biopsy  . CHOLECYSTECTOMY    . DILATION AND CURETTAGE OF UTERUS    . FRACTURE SURGERY     fx lt arm  . Nipple repair    . PILONIDAL CYST EXCISION    . right knee surgery    . TONSILLECTOMY      Social History   Tobacco Use  . Smoking status: Former Smoker    Years: 5.00    Types: Cigarettes    Quit date:  02/03/1975    Years since quitting: 43.7  . Smokeless tobacco: Never Used  . Tobacco comment: 1 pack per week  Substance Use Topics  . Alcohol use: No    Alcohol/week: 0.0 standard drinks  . Drug use: No    Family History  Problem Relation Age of Onset  . Heart attack Mother        mi in 60s  . Alcohol abuse Mother        cirrhosis of liver  . Heart attack Father        MI in 6s  . Rectal cancer Maternal Grandmother   . Cancer Maternal Grandmother  breast  . Cancer Sister        pt unaware of what kind    Allergies  Allergen Reactions  . Codeine Nausea And Vomiting  . Interferon Beta-1a Nausea And Vomiting and Other (See Comments)    Severe flu-like symptoms Symptoms expected  . Sulfonamide Derivatives Nausea Only    Medication list has been reviewed and updated.  Current Outpatient Medications on File Prior to Visit  Medication Sig Dispense Refill  . albuterol (PROVENTIL HFA;VENTOLIN HFA) 108 (90 Base) MCG/ACT inhaler Inhale 2 puffs into the lungs 4 (four) times daily as needed. Shortness of breath (Patient taking differently: Inhale 2 puffs into the lungs 4 (four) times daily as needed for wheezing or shortness of breath. ) 1 Inhaler 6  . albuterol (PROVENTIL) (2.5 MG/3ML) 0.083% nebulizer solution INHALE 1 VIAL BY NEBULIZER AS DIRECTED (Patient taking differently: Take 2.5 mg by nebulization 4 (four) times daily as needed for wheezing or shortness of breath. ) 75 mL 0  . blood glucose meter kit and supplies KIT Dispense based on patient and insurance preference. Use up to four times daily as directed. (FOR ICD-9 250.00, 250.01). 1 each 0  . metFORMIN (GLUCOPHAGE) 500 MG tablet Take 1 tablet (500 mg total) by mouth 2 (two) times daily with a meal. 90 tablet 3  . valACYclovir (VALTREX) 1000 MG tablet 1 tablet by mouth tid x 7 days then 1 po qd (Patient taking differently: Take 1,000 mg by mouth See admin instructions. Take 1044m by mouth three times a day as needed  for cold sores.) 42 tablet 5   No current facility-administered medications on file prior to visit.     Review of Systems:  As per HPI- otherwise negative. No fever or cough  Physical Examination: Vitals:   10/13/18 1433  BP: (!) 144/98  Pulse: 89  Resp: 18  Temp: 97.8 F (36.6 C)  SpO2: 98%   Vitals:   10/13/18 1433  Weight: 270 lb (122.5 kg)   Body mass index is 47.83 kg/m. Ideal Body Weight:    GEN: WDWN, NAD, Non-toxic, A & O x 3, morbidly obese, otherwise looks well HEENT: Atraumatic, Normocephalic. Neck supple. No masses, No LAD. Ears and Nose: No external deformity. CV: RRR, No M/G/R. No JVD. No thrill. No extra heart sounds. PULM: CTA B, no wheezes, crackles, rhonchi. No retractions. No resp. distress. No accessory muscle use. ABD: S, NT, ND, +BS. No rebound. No HSM. EXTR: No c/c/e NEURO Normal gait.  PSYCH: Normally interactive. Conversant. Not depressed or anxious appearing.  Calm demeanor.  She has trace swelling in the right foot and ankle, 1+ on the left.  Feet are warm well perfused with normal pulses.  I do not appreciate any change in color or apparent visible bruising  Assessment and Plan: Leg swelling - Plan: Basic metabolic panel, B Nat Peptide, [place]Venous Img Lower Bilateral, CANCELED: [place]Venous Img Lower Bilateral  Here today with concern of bilateral leg swelling, left worse than right, for 2 or 3 weeks.  She also notes intermittent shortness of breath when questioned. She denies chest pain, and recently had a complete cardiac work-up She endorses history of DVT and possible clotting disorder.  I attempted to order an outpatient ultrasound of her legs today, but was unable to do so.  Suggested that she go to the emergency room to have this done today, but she declines due to other family obligations.  Discussed risk of DVT leading to dangerous pulmonary embolism.  We plan to get her ultrasound done tomorrow.  I scheduled this for 9 AM, but she will  have to reschedule for after lunch due to an appointment  We will also obtain BNP and BMP today  Signed Lamar Blinks, MD  Received her labs and [place] report, called pt 9/11 No sign of CHF or DVT Discussed likely venous insuf, suggested elevation of her legs and compression socks  Offered a diuretic but she declines  Will let me know if she continues to have concerns   Results for orders placed or performed in visit on 96/29/52  Basic metabolic panel  Result Value Ref Range   Sodium 142 135 - 145 mEq/L   Potassium 3.6 3.5 - 5.1 mEq/L   Chloride 104 96 - 112 mEq/L   CO2 31 19 - 32 mEq/L   Glucose, Bld 131 (H) 70 - 99 mg/dL   BUN 14 6 - 23 mg/dL   Creatinine, Ser 0.73 0.40 - 1.20 mg/dL   Calcium 8.9 8.4 - 10.5 mg/dL   GFR 98.26 >60.00 mL/min  B Nat Peptide  Result Value Ref Range   Pro B Natriuretic peptide (BNP) 32.0 0.0 - 100.0 pg/mL   US Venous Img Lower Bilateral  Result Date: 10/14/2018 CLINICAL DATA:  Swelling x2 weeks, edema. Intermittent pain. History of DVT 2014. EXAM: BILATERAL LOWER EXTREMITY VENOUS DOPPLER ULTRASOUND TECHNIQUE: Gray-scale sonography with compression, as well as color and duplex ultrasound, were performed to evaluate the deep venous system from the level of the common femoral vein through the popliteal and proximal calf veins. Technologist describes technically difficult study secondary to body habitus and pain. COMPARISON:  09/02/2015 FINDINGS: Normal compressibility of the common femoral, superficial femoral, and popliteal veins, as well as the proximal calf veins. No filling defects to suggest DVT on grayscale or color Doppler imaging. Doppler waveforms show normal direction of venous flow, normal respiratory phasicity and response to augmentation. IMPRESSION: No femoropopliteal and no calf DVT in the visualized calf veins. If clinical symptoms are inconsistent or if there are persistent or worsening symptoms, further imaging (possibly involving the iliac  veins) may be warranted. Electronically Signed   By: Lucrezia Europe M.D.   On: 10/14/2018 14:26

## 2018-10-13 NOTE — Patient Instructions (Addendum)
Please go to the lab today- we are going to look for any cause of your foot swelling You have an ultrasound appt here at the med center tomorrow at 9am- arrive 8:45 please  We will make sure no blood clot is present  If any shortness of breath or distress in the meantime please seek immediate care at the ER

## 2018-10-14 ENCOUNTER — Ambulatory Visit (HOSPITAL_BASED_OUTPATIENT_CLINIC_OR_DEPARTMENT_OTHER)
Admission: RE | Admit: 2018-10-14 | Discharge: 2018-10-14 | Disposition: A | Discharge status: Home / Self Care | Payer: Medicare HMO | Source: Ambulatory Visit | Attending: Family Medicine | Admitting: Family Medicine

## 2018-10-14 ENCOUNTER — Other Ambulatory Visit (HOSPITAL_BASED_OUTPATIENT_CLINIC_OR_DEPARTMENT_OTHER): Payer: Medicare HMO

## 2018-10-14 ENCOUNTER — Encounter: Payer: Self-pay | Admitting: Family Medicine

## 2018-10-14 DIAGNOSIS — R600 Localized edema: Secondary | ICD-10-CM | POA: Diagnosis not present

## 2018-10-14 DIAGNOSIS — M7989 Other specified soft tissue disorders: Secondary | ICD-10-CM

## 2018-10-14 LAB — BASIC METABOLIC PANEL
BUN: 14 mg/dL (ref 6–23)
CO2: 31 mEq/L (ref 19–32)
Calcium: 8.9 mg/dL (ref 8.4–10.5)
Chloride: 104 mEq/L (ref 96–112)
Creatinine, Ser: 0.73 mg/dL (ref 0.40–1.20)
GFR: 98.26 mL/min (ref >60.00)
Glucose, Bld: 131 mg/dL — ABNORMAL HIGH (ref 70–99)
Potassium: 3.6 mEq/L (ref 3.5–5.1)
Sodium: 142 mEq/L (ref 135–145)

## 2018-10-14 LAB — BRAIN NATRIURETIC PEPTIDE: Pro B Natriuretic peptide (BNP): 32 pg/mL (ref 0.0–100.0)

## 2018-10-27 DIAGNOSIS — G4733 Obstructive sleep apnea (adult) (pediatric): Secondary | ICD-10-CM | POA: Diagnosis not present

## 2018-10-28 DIAGNOSIS — G4733 Obstructive sleep apnea (adult) (pediatric): Secondary | ICD-10-CM | POA: Diagnosis not present

## 2018-10-30 DIAGNOSIS — J45909 Unspecified asthma, uncomplicated: Secondary | ICD-10-CM | POA: Diagnosis not present

## 2018-11-22 ENCOUNTER — Telehealth: Payer: Self-pay

## 2018-11-22 NOTE — Telephone Encounter (Signed)
Copied from [place] [id number]. Topic: General - Other >> Nov 22, 2018  1:41 PM Leward Quan A wrote: Reason for CRM: Patient called to say that she had a missed call was not sure why but she is out of town and say if anyone is trying to reach her they can call her at Ph# [phone] and leave her a detailed message.

## 2018-11-27 DIAGNOSIS — G4733 Obstructive sleep apnea (adult) (pediatric): Secondary | ICD-10-CM | POA: Diagnosis not present

## 2018-11-29 DIAGNOSIS — J45909 Unspecified asthma, uncomplicated: Secondary | ICD-10-CM | POA: Diagnosis not present

## 2018-12-28 DIAGNOSIS — G4733 Obstructive sleep apnea (adult) (pediatric): Secondary | ICD-10-CM | POA: Diagnosis not present

## 2018-12-30 DIAGNOSIS — J45909 Unspecified asthma, uncomplicated: Secondary | ICD-10-CM | POA: Diagnosis not present

## 2019-01-26 DIAGNOSIS — G4733 Obstructive sleep apnea (adult) (pediatric): Secondary | ICD-10-CM | POA: Diagnosis not present

## 2019-01-27 DIAGNOSIS — G4733 Obstructive sleep apnea (adult) (pediatric): Secondary | ICD-10-CM | POA: Diagnosis not present

## 2019-01-29 DIAGNOSIS — J45909 Unspecified asthma, uncomplicated: Secondary | ICD-10-CM | POA: Diagnosis not present

## 2019-02-27 DIAGNOSIS — G4733 Obstructive sleep apnea (adult) (pediatric): Secondary | ICD-10-CM | POA: Diagnosis not present

## 2019-03-24 ENCOUNTER — Telehealth: Payer: Self-pay | Admitting: Family Medicine

## 2019-03-24 NOTE — Telephone Encounter (Signed)
You have not seen pt since 07/25/18. Please advice.

## 2019-03-24 NOTE — Telephone Encounter (Signed)
Per [place] from [facility] , patient is requesting an order/ a Mammogram /colonoscopy.

## 2019-03-24 NOTE — Telephone Encounter (Signed)
She does not need a referral for mammogram Colonoscopy---  we would at least need virtual visit

## 2019-03-28 ENCOUNTER — Telehealth: Payer: Self-pay | Admitting: Pulmonary Disease
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
## 2019-04-04 NOTE — Telephone Encounter (Signed)
I called and spoke with the patient and made her aware that Dr. Halford Chessman was out of the office and that his NP Geraldo Pitter has agreed to write the letter for her. I advised her that she can pick it up in office tomorrow. I will type up the letter and give to [facility] to sign.
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
## 2019-04-04 NOTE — Telephone Encounter (Signed)
I am forwarding this to Geraldo Pitter, NP to see if she can help with this in the absence of Dr. Halford Chessman.  Beth, Dr. Halford Chessman is out of the office with a family emergency. Can you please assist with this. Please advise.
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
## 2019-08-11 ENCOUNTER — Telehealth: Payer: Self-pay | Admitting: Family Medicine

## 2019-08-11 DIAGNOSIS — L989 Disorder of the skin and subcutaneous tissue, unspecified: Secondary | ICD-10-CM
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
## 2019-08-11 NOTE — Telephone Encounter (Signed)
Patient states that she would like a referral to a dermatologist. Patient states she would like to go to another Demonologist.Other than the last dermatologist she went to .

## 2019-08-14 ENCOUNTER — Other Ambulatory Visit: Payer: Self-pay | Admitting: Family Medicine

## 2019-08-14 NOTE — Telephone Encounter (Signed)
Ok to place referral but last referral was 8 years ago--- there is no note in epic

## 2019-08-14 NOTE — Telephone Encounter (Signed)
Referral placed.

## 2020-03-20 ENCOUNTER — Other Ambulatory Visit: Payer: Self-pay | Admitting: Obstetrics and Gynecology

## 2020-03-28 ENCOUNTER — Encounter: Payer: Self-pay | Admitting: Family Medicine

## 2020-03-28 ENCOUNTER — Ambulatory Visit: Payer: Medicare HMO | Admitting: Family Medicine

## 2020-03-28 ENCOUNTER — Other Ambulatory Visit: Payer: Self-pay

## 2020-03-28 VITALS — BP 136/80 | Temp 98.6°F | Resp 18 | Wt 276.4 lb

## 2020-03-28 DIAGNOSIS — E1165 Type 2 diabetes mellitus with hyperglycemia: Secondary | ICD-10-CM

## 2020-03-28 DIAGNOSIS — E1169 Type 2 diabetes mellitus with other specified complication: Secondary | ICD-10-CM

## 2020-03-28 DIAGNOSIS — E785 Hyperlipidemia, unspecified: Secondary | ICD-10-CM

## 2020-03-28 DIAGNOSIS — K739 Chronic hepatitis, unspecified: Secondary | ICD-10-CM | POA: Diagnosis not present

## 2020-03-28 DIAGNOSIS — B1710 Acute hepatitis C without hepatic coma: Secondary | ICD-10-CM

## 2020-03-28 DIAGNOSIS — I10 Essential (primary) hypertension: Secondary | ICD-10-CM

## 2020-03-28 DIAGNOSIS — Z1211 Encounter for screening for malignant neoplasm of colon: Secondary | ICD-10-CM

## 2020-03-28 LAB — COMPREHENSIVE METABOLIC PANEL
ALT: 45 U/L — ABNORMAL HIGH (ref 0–35)
AST: 28 U/L (ref 0–37)
Albumin: 4 g/dL (ref 3.5–5.2)
Alkaline Phosphatase: 58 U/L (ref 39–117)
BUN: 8 mg/dL (ref 6–23)
CO2: 29 mEq/L (ref 19–32)
Calcium: 9.5 mg/dL (ref 8.4–10.5)
Chloride: 98 mEq/L (ref 96–112)
Creatinine, Ser: 0.79 mg/dL (ref 0.40–1.20)
GFR: 80.48 mL/min (ref >60.00)
Glucose, Bld: 298 mg/dL — ABNORMAL HIGH (ref 70–99)
Potassium: 4.1 mEq/L (ref 3.5–5.1)
Sodium: 137 mEq/L (ref 135–145)
Total Bilirubin: 0.5 mg/dL (ref 0.2–1.2)
Total Protein: 6.9 g/dL (ref 6.0–8.3)

## 2020-03-28 LAB — MICROALBUMIN / CREATININE URINE RATIO
Creatinine,U: 132.2 mg/dL
Microalb Creat Ratio: 0.8 mg/g (ref 0.0–30.0)
Microalb, Ur: 1 mg/dL (ref 0.0–1.9)

## 2020-03-28 MED ORDER — OZEMPIC (0.25 OR 0.5 MG/DOSE) 2 MG/1.5ML ~~LOC~~ SOPN: PEN_INJECTOR | SUBCUTANEOUS | 3 refills | Status: DC

## 2020-03-28 MED ORDER — METFORMIN HCL 500 MG PO TABS: 500.0000 mg | ORAL_TABLET | Freq: Two times a day (BID) | ORAL | 3 refills | Status: DC

## 2020-03-28 MED ORDER — HYDROCHLOROTHIAZIDE 25 MG PO TABS: 25.0000 mg | ORAL_TABLET | Freq: Every day | ORAL | 2 refills | Status: DC

## 2020-03-28 MED ORDER — BLOOD GLUCOSE MONITOR KIT: PACK | 0 refills | Status: DC

## 2020-03-28 MED ORDER — ROSUVASTATIN CALCIUM 10 MG PO TABS: 10.0000 mg | ORAL_TABLET | Freq: Every day | ORAL | 3 refills | Status: DC

## 2020-03-28 NOTE — Assessment & Plan Note (Signed)
Check labs  ozempic started for dm -- -may help with weight as well

## 2020-03-28 NOTE — Progress Notes (Signed)
Patient ID: Molly Klein, female    DOB: [birth year]  Age: 60 y.o. MRN: [id number]    Subjective:  Subjective  HPI Molly Klein presents for f/u dm, chol and bp.  Last ov 2020.   She has been under a lot of stress.  She went to see her gyn and her a1c was 10 and hep c positive.  She had hep c in past and was treated. Pt has gained a lot of weight and is frustrated about that .     Review of Systems  Constitutional: Negative for appetite change, diaphoresis, fatigue and unexpected weight change.  Eyes: Negative for pain, redness and visual disturbance.  Respiratory: Negative for cough, chest tightness, shortness of breath and wheezing.   Cardiovascular: Negative for chest pain, palpitations and leg swelling.  Endocrine: Negative for cold intolerance, heat intolerance, polydipsia, polyphagia and polyuria.  Genitourinary: Negative for difficulty urinating, dysuria and frequency.  Neurological: Negative for dizziness, light-headedness, numbness and headaches.    History Past Medical History:  Diagnosis Date  . Asthma   . Bruising 10/21/2012  . Cervical spinal stenosis    L4-5  . DVT (deep venous thrombosis) ([place])   . GERD (gastroesophageal reflux disease)   . Hepatitis C    Treated and cured per pt  . History of DVT of lower extremity 10/21/2012  . Hypertension   . Knee pain    left   . Neuromuscular disorder (HCC)    neuropathy lt arm  . Nipple discharge    right breast  . OSA (obstructive sleep apnea)     She has a past surgical history that includes Cholecystectomy; Pilonidal cyst excision; Dilation and curettage of uterus; Tonsillectomy; Abdominal hysterectomy; Fracture surgery; Breast biopsy (02/06/2011); right knee surgery; Nipple repair; and Anterior cervical decomp/discectomy fusion (N/A, 07/20/2018).   Her family history includes Alcohol abuse in her mother; Cancer in her maternal grandmother and sister; Heart attack in her father and mother; Rectal cancer in her  maternal grandmother.She reports that she quit smoking about 45 years ago. Her smoking use included cigarettes. She quit after 5.00 years of use. She has never used smokeless tobacco. She reports that she does not drink alcohol and does not use drugs.  Current Outpatient Medications on File Prior to Visit  Medication Sig Dispense Refill  . albuterol (PROVENTIL HFA;VENTOLIN HFA) 108 (90 Base) MCG/ACT inhaler Inhale 2 puffs into the lungs 4 (four) times daily as needed. Shortness of breath (Patient taking differently: Inhale 2 puffs into the lungs 4 (four) times daily as needed for wheezing or shortness of breath.) 1 Inhaler 6  . albuterol (PROVENTIL) (2.5 MG/3ML) 0.083% nebulizer solution INHALE 1 VIAL BY NEBULIZER AS DIRECTED (Patient taking differently: Take 2.5 mg by nebulization 4 (four) times daily as needed for wheezing or shortness of breath.) 75 mL 0  . valACYclovir (VALTREX) 1000 MG tablet TAKE 1 TABLET BY MOUTH THREE TIMES DAILY FOR 7 DAYS THEN TAKE 1 TABLET BY MOUTH EVERY DAY 42 tablet 5   No current facility-administered medications on file prior to visit.     Objective:  Objective  Physical Exam Vitals and nursing note reviewed.  Constitutional:      Appearance: She is well-developed and well-nourished.  HENT:     Head: Normocephalic and atraumatic.  Eyes:     Extraocular Movements: EOM normal.     Conjunctiva/sclera: Conjunctivae normal.  Neck:     Thyroid: No thyromegaly.     Vascular: No carotid bruit or  JVD.  Cardiovascular:     Rate and Rhythm: Normal rate and regular rhythm.     Heart sounds: Normal heart sounds. No murmur heard.   Pulmonary:     Effort: Pulmonary effort is normal. No respiratory distress.     Breath sounds: Normal breath sounds. No wheezing or rales.  Chest:     Chest wall: No tenderness.  Musculoskeletal:        General: No edema.     Cervical back: Normal range of motion and neck supple.  Neurological:     Mental Status: She is alert and  oriented to person, place, and time.  Psychiatric:        Mood and Affect: Mood and affect normal.    BP 136/80 (BP Location: Left Arm, Patient Position: Sitting, Cuff Size: Large)   Temp 98.6 F (37 C) (Oral)   Resp 18   Wt 276 lb 6.4 oz (125.4 kg)   BMI 48.96 kg/m  Wt Readings from Last 3 Encounters:  03/28/20 276 lb 6.4 oz (125.4 kg)  10/13/18 270 lb (122.5 kg)  07/29/18 266 lb (120.7 kg)     Lab Results  Component Value Date   WBC 3.3 (L) 07/14/2018   HGB 14.3 07/14/2018   HCT 41.3 07/14/2018   PLT 185 07/14/2018   GLUCOSE 131 (H) 10/13/2018   CHOL 170 09/05/2015   TRIG 60.0 09/05/2015   HDL 54.40 09/05/2015   LDLCALC 103 (H) 09/05/2015   ALT 25 07/14/2018   AST 22 07/14/2018   NA 142 10/13/2018   K 3.6 10/13/2018   CL 104 10/13/2018   CREATININE 0.73 10/13/2018   BUN 14 10/13/2018   CO2 31 10/13/2018   TSH 1.65 09/05/2015   INR 1.04 02/11/2010   HGBA1C 7.2 (H) 07/20/2018   MICROALBUR 1.1 09/05/2015    US Venous Img Lower Bilateral  Result Date: 10/14/2018 CLINICAL DATA:  Swelling x2 weeks, edema. Intermittent pain. History of DVT 2014. EXAM: BILATERAL LOWER EXTREMITY VENOUS DOPPLER ULTRASOUND TECHNIQUE: Gray-scale sonography with compression, as well as color and duplex ultrasound, were performed to evaluate the deep venous system from the level of the common femoral vein through the popliteal and proximal calf veins. Technologist describes technically difficult study secondary to body habitus and pain. COMPARISON:  09/02/2015 FINDINGS: Normal compressibility of the common femoral, superficial femoral, and popliteal veins, as well as the proximal calf veins. No filling defects to suggest DVT on grayscale or color Doppler imaging. Doppler waveforms show normal direction of venous flow, normal respiratory phasicity and response to augmentation. IMPRESSION: No femoropopliteal and no calf DVT in the visualized calf veins. If clinical symptoms are inconsistent or if  there are persistent or worsening symptoms, further imaging (possibly involving the iliac veins) may be warranted. Electronically Signed   By: Lucrezia Europe M.D.   On: 10/14/2018 14:26     Assessment & Plan:  Plan  I am having Juanita Craver. Fournier start on hydrochlorothiazide, rosuvastatin, and Ozempic (0.25 or 0.5 MG/DOSE). I am also having her maintain her albuterol, albuterol, valACYclovir, blood glucose meter kit and supplies, and metFORMIN.  Meds ordered this encounter  Medications  . blood glucose meter kit and supplies KIT    Sig: Dispense based on patient and insurance preference. Use up to four times daily as directed. (FOR ICD-9 250.00, 250.01).    Dispense:  1 each    Refill:  0    Dx: E11.9    Order Specific Question:   Number of  strips    Answer:   100    Order Specific Question:   Number of lancets    Answer:   100  . hydrochlorothiazide (HYDRODIURIL) 25 MG tablet    Sig: Take 1 tablet (25 mg total) by mouth daily.    Dispense:  30 tablet    Refill:  2  . metFORMIN (GLUCOPHAGE) 500 MG tablet    Sig: Take 1 tablet (500 mg total) by mouth 2 (two) times daily with a meal.    Dispense:  90 tablet    Refill:  3  . rosuvastatin (CRESTOR) 10 MG tablet    Sig: Take 1 tablet (10 mg total) by mouth daily.    Dispense:  90 tablet    Refill:  3  . OZEMPIC, 0.25 OR 0.5 MG/DOSE, 2 MG/1.5ML SOPN    Sig: 0.25 mg sq weekly    Dispense:  1.5 mL    Refill:  3    Problem List Items Addressed This Visit      Unprioritized   Chronic hepatitis ([place])   Relevant Orders   Ambulatory referral to Infectious Disease   HCV RNA, Quantitative Real Time PCR   Essential hypertension    With edema hctz daily F/u 3 months or sooner prn  Check labs today Well controlled, no changes to meds. Encouraged heart healthy diet such as the DASH diet and exercise as tolerated.        Relevant Medications   hydrochlorothiazide (HYDRODIURIL) 25 MG tablet   rosuvastatin (CRESTOR) 10 MG tablet    HEPATITIS C    Refer to ID       Hyperlipidemia associated with type 2 diabetes mellitus (HCC)    Encouraged heart healthy diet, increase exercise, avoid trans fats, consider a krill oil cap daily       Relevant Medications   metFORMIN (GLUCOPHAGE) 500 MG tablet   rosuvastatin (CRESTOR) 10 MG tablet   OZEMPIC, 0.25 OR 0.5 MG/DOSE, 2 MG/1.5ML SOPN   Morbid obesity with body mass index (BMI) of 40.0 to 49.9 (HCC)    Check labs  ozempic started for dm -- -may help with weight as well       Relevant Medications   metFORMIN (GLUCOPHAGE) 500 MG tablet   OZEMPIC, 0.25 OR 0.5 MG/DOSE, 2 MG/1.5ML SOPN   Uncontrolled type 2 diabetes mellitus with hyperglycemia ([place])    hgba1c to be checked  minimize simple carbs. Increase exercise as tolerated. Continue current meds       Relevant Medications   blood glucose meter kit and supplies KIT   metFORMIN (GLUCOPHAGE) 500 MG tablet   rosuvastatin (CRESTOR) 10 MG tablet   OZEMPIC, 0.25 OR 0.5 MG/DOSE, 2 MG/1.5ML SOPN   Other Relevant Orders   Comprehensive metabolic panel   Microalbumin / creatinine urine ratio    Other Visit Diagnoses    Colon cancer screening    -  Primary   Relevant Orders   Ambulatory referral to Gastroenterology   Primary hypertension       Relevant Medications   hydrochlorothiazide (HYDRODIURIL) 25 MG tablet   rosuvastatin (CRESTOR) 10 MG tablet      Follow-up: Return in about 3 months (around 06/25/2020), or if symptoms worsen or fail to improve, for hypertension, hyperlipidemia, diabetes II.  [name], DO

## 2020-03-28 NOTE — Assessment & Plan Note (Signed)
hgba1c to be checked minimize simple carbs. Increase exercise as tolerated. Continue current meds 

## 2020-03-28 NOTE — Assessment & Plan Note (Signed)
With edema hctz daily F/u 3 months or sooner prn  Check labs today Well controlled, no changes to meds. Encouraged heart healthy diet such as the DASH diet and exercise as tolerated.

## 2020-03-28 NOTE — Assessment & Plan Note (Signed)
Encouraged heart healthy diet, increase exercise, avoid trans fats, consider a krill oil cap daily 

## 2020-03-28 NOTE — Assessment & Plan Note (Signed)
Refer to ID

## 2020-03-28 NOTE — Patient Instructions (Signed)

## 2020-03-29 ENCOUNTER — Telehealth: Payer: Self-pay

## 2020-03-29 NOTE — Telephone Encounter (Signed)
[employer] Patient Teacher, English as a foreign language completed.    The patient is insured through [facility].  Patient insurance require a PA for either medication.  We will continue to follow to see if copay assistance is needed.  Ileene Patrick, [facility] Patient [facility] Phone: [phone] Fax:  [phone]

## 2020-03-30 LAB — HEPATITIS C RNA QUANTITATIVE
HCV Quantitative Log: <1.18 log IU/mL
HCV RNA, PCR, QN: <15 IU/mL

## 2020-04-02 ENCOUNTER — Ambulatory Visit (INDEPENDENT_AMBULATORY_CARE_PROVIDER_SITE_OTHER): Payer: Medicare HMO | Admitting: Family

## 2020-04-02 ENCOUNTER — Other Ambulatory Visit: Payer: Self-pay

## 2020-04-02 ENCOUNTER — Encounter: Payer: Self-pay | Admitting: Family

## 2020-04-02 VITALS — BP 135/82 | HR 86 | Temp 98.6°F | Ht 63.0 in | Wt 273.0 lb

## 2020-04-02 DIAGNOSIS — B1710 Acute hepatitis C without hepatic coma: Secondary | ICD-10-CM | POA: Diagnosis not present

## 2020-04-02 DIAGNOSIS — R768 Other specified abnormal immunological findings in serum: Secondary | ICD-10-CM

## 2020-04-02 NOTE — Progress Notes (Signed)
Subjective:    Patient ID: Molly Klein, female    DOB: August 20, 1960, 60 y.o.   MRN: [id number]  Chief Complaint  Patient presents with  . Hepatitis C    HPI:  Molly Klein is a 60 y.o. female with previous history detailed below presenting today for initial evaluation and treatment of hepatitis C.  Molly Klein was recently evaluated in her gynecology office for routine follow-up and found to have a positive hepatitis C antibody.  Blood work completed through primary care indicates hepatitis C RNA level that was undetectable.  Molly Klein was previously diagnosed with hepatitis C and involved in a research study where she was a nonresponder initially and then went on oral medications and was successfully treated.  Risk factors for acquiring hepatitis C include IV drug use in the past and tattoos.  Denies history of blood transfusions, sharing of toothbrushes/razors, or sexual contact with known positive partners.  No personal or family history of liver disease.  No current recreational or illicit drug use, tobacco use, or alcohol consumption.   Allergies  Allergen Reactions  . Codeine Nausea And Vomiting  . Interferon Beta-1a Nausea And Vomiting and Other (See Comments)    Severe flu-like symptoms Symptoms expected  . Sulfonamide Derivatives Nausea Only      Outpatient Medications Prior to Visit  Medication Sig Dispense Refill  . albuterol (PROVENTIL HFA;VENTOLIN HFA) 108 (90 Base) MCG/ACT inhaler Inhale 2 puffs into the lungs 4 (four) times daily as needed. Shortness of breath (Patient taking differently: Inhale 2 puffs into the lungs 4 (four) times daily as needed for wheezing or shortness of breath.) 1 Inhaler 6  . albuterol (PROVENTIL) (2.5 MG/3ML) 0.083% nebulizer solution INHALE 1 VIAL BY NEBULIZER AS DIRECTED (Patient taking differently: Take 2.5 mg by nebulization 4 (four) times daily as needed for wheezing or shortness of breath.) 75 mL 0  . blood glucose  meter kit and supplies KIT Dispense based on patient and insurance preference. Use up to four times daily as directed. (FOR ICD-9 250.00, 250.01). 1 each 0  . hydrochlorothiazide (HYDRODIURIL) 25 MG tablet Take 1 tablet (25 mg total) by mouth daily. 30 tablet 2  . metFORMIN (GLUCOPHAGE) 500 MG tablet Take 1 tablet (500 mg total) by mouth 2 (two) times daily with a meal. 90 tablet 3  . OZEMPIC, 0.25 OR 0.5 MG/DOSE, 2 MG/1.5ML SOPN 0.25 mg sq weekly 1.5 mL 3  . Polyethyl Glycol-Propyl Glycol (SYSTANE OP) Apply to eye.    . rosuvastatin (CRESTOR) 10 MG tablet Take 1 tablet (10 mg total) by mouth daily. 90 tablet 3  . valACYclovir (VALTREX) 1000 MG tablet TAKE 1 TABLET BY MOUTH THREE TIMES DAILY FOR 7 DAYS THEN TAKE 1 TABLET BY MOUTH EVERY DAY 42 tablet 5   No facility-administered medications prior to visit.     Past Medical History:  Diagnosis Date  . Asthma   . Bruising 10/21/2012  . Cervical spinal stenosis    L4-5  . DVT (deep venous thrombosis) ([place])   . GERD (gastroesophageal reflux disease)   . Hepatitis C    Treated and cured per pt  . History of DVT of lower extremity 10/21/2012  . Hypertension   . Knee pain    left   . Neuromuscular disorder (HCC)    neuropathy lt arm  . Nipple discharge    right breast  . OSA (obstructive sleep apnea)       Past Surgical History:  Procedure Laterality Date  .  ABDOMINAL HYSTERECTOMY    . ANTERIOR CERVICAL DECOMP/DISCECTOMY FUSION N/A 07/20/2018   Procedure: c4-5ANTERIOR CERVICAL DECOMPRESSION/DISCECTOMY FUSION, ALLOGRAFT & PLATE;  Surgeon: Marybelle Killings, MD;  Location: [facility];  Service: Orthopedics;  Laterality: N/A;  . BREAST BIOPSY  02/06/2011   Procedure: BREAST BIOPSY;  Surgeon: Earnstine Regal, MD;  Location: [facility];  Service: General;  Laterality: Right;  right breast biopsy  . CHOLECYSTECTOMY    . DILATION AND CURETTAGE OF UTERUS    . FRACTURE SURGERY     fx lt arm  . Nipple repair    . PILONIDAL CYST EXCISION     . right knee surgery    . TONSILLECTOMY        Family History  Problem Relation Age of Onset  . Heart attack Mother        mi in 27s  . Alcohol abuse Mother        cirrhosis of liver  . Heart attack Father        MI in 58s  . Rectal cancer Maternal Grandmother   . Cancer Maternal Grandmother        breast  . Cancer Sister        pt unaware of what kind      Social History   Socioeconomic History  . Marital status: Married    Spouse name: Not on file  . Number of children: Not on file  . Years of education: Not on file  . Highest education level: Not on file  Occupational History  . Occupation: retired    Fish farm manager: DISABLED  Tobacco Use  . Smoking status: Former Smoker    Years: 5.00    Types: Cigarettes    Quit date: 02/03/1975    Years since quitting: 45.1  . Smokeless tobacco: Never Used  . Tobacco comment: 1 pack per week  Vaping Use  . Vaping Use: Never used  Substance and Sexual Activity  . Alcohol use: No    Alcohol/week: 0.0 standard drinks  . Drug use: No  . Sexual activity: Yes    Partners: Male  Other Topics Concern  . Not on file  Social History Narrative   Exercise-- 2-3 days a week   Social Determinants of Health   Financial Resource Strain: Not on file  Food Insecurity: Not on file  Transportation Needs: Not on file  Physical Activity: Not on file  Stress: Not on file  Social Connections: Not on file  Intimate Partner Violence: Not on file      Review of Systems  Constitutional: Negative for chills, fatigue, fever and unexpected weight change.  Respiratory: Negative for cough, chest tightness, shortness of breath and wheezing.   Cardiovascular: Negative for chest pain and leg swelling.  Gastrointestinal: Negative for abdominal distention, constipation, diarrhea, nausea and vomiting.  Neurological: Negative for dizziness, weakness, light-headedness and headaches.  Hematological: Does not bruise/bleed easily.       Objective:     BP 135/82   Pulse 86   Temp 98.6 F (37 C) (Oral)   Ht _0  (1.6 m)   Wt 273 lb (123.8 kg)   SpO2 97%   BMI 48.36 kg/m  Nursing note and vital signs reviewed.  Physical Exam Constitutional:      General: She is not in acute distress.    Appearance: She is well-developed.  Cardiovascular:     Rate and Rhythm: Normal rate and regular rhythm.     Pulses: Intact distal pulses.  Heart sounds: Normal heart sounds. No murmur heard. No friction rub. No gallop.   Pulmonary:     Effort: Pulmonary effort is normal. No respiratory distress.     Breath sounds: Normal breath sounds. No wheezing or rales.  Chest:     Chest wall: No tenderness.  Abdominal:     General: Bowel sounds are normal. There is no distension or ascites.     Palpations: Abdomen is soft. There is no hepatosplenomegaly or mass.     Tenderness: There is no abdominal tenderness. There is no guarding or rebound.  Skin:    General: Skin is warm and dry.  Neurological:     Mental Status: She is alert and oriented to person, place, and time.  Psychiatric:        Mood and Affect: Mood and affect normal.        Behavior: Behavior normal.        Thought Content: Thought content normal.        Judgment: Judgment normal.         Assessment & Plan:   Patient Active Problem List   Diagnosis Date Noted  . Uncontrolled type 2 diabetes mellitus with hyperglycemia ([place]) 03/28/2020  . Chronic hepatitis ([place]) 03/28/2020  . Hyperlipidemia associated with type 2 diabetes mellitus ([place]) 03/28/2020  . Cervical spinal stenosis 07/20/2018  . Spinal stenosis of cervical region 06/14/2018  . Pre-operative cardiovascular examination 04/07/2018  . Sleep apnea 04/07/2018  . Morbid obesity with body mass index (BMI) of 40.0 to 49.9 ([place]) 04/07/2018  . Grief counseling 03/17/2018  . Pre-op evaluation 03/16/2018  . Protrusion of cervical intervertebral disc 01/10/2018  . Right wrist injury 08/26/2014  . Left knee pain  08/26/2014  . Severe obesity (BMI >= 40) ([place]) 03/12/2014  . Popliteal pain 12/20/2013  . Contusion of left calf 12/13/2013  . Acute bronchitis with asthma with acute exacerbation 05/15/2013  . Bruising 10/21/2012  . History of DVT of lower extremity 10/21/2012  . Acute thoracic back pain 09/28/2012  . Sinus congestion 04/01/2012  . Solitary pulmonary nodule 10/07/2011  . Lichenoid keratosis 32/99/2426  . 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 Nipple discharge in female, right 02/04/2011  . Essential hypertension 02/06/2010  . STRESS INCONTINENCE 04/22/2009  . UTI 10/24/2008  . PATELLAR DISLOCATION, RIGHT 10/24/2008  . HEPATITIS C 10/08/2006  . SLEEP APNEA, OBSTRUCTIVE 10/08/2006  . NARCOLEPSY W/O CATAPLEXY 10/08/2006  . Mild persistent asthma 10/08/2006  . GERD 10/08/2006  . OVERACTIVE BLADDER 10/08/2006  . FIBROCYSTIC BREAST DISEASE 10/08/2006     Problem List Items Addressed This Visit      Other   HEPATITIS C - Primary    Molly Klein is a 60 year old female with a positive hepatitis C antibody secondary to previous hepatitis C infection status post treatment through research.  Hepatitis C RNA level is undetectable.  No further evaluation/treatment is necessary as the hepatitis C virus is undetectable.  Hepatitis C antibody will always remain positive in her case and will need hepatitis C RNA level if future screening is necessary.  No further ID follow-up needed.       Other Visit Diagnoses    Positive hepatitis C antibody test           I am having Molly Klein maintain her albuterol, albuterol, valACYclovir, blood glucose meter kit and supplies, hydrochlorothiazide, metFORMIN, rosuvastatin, Ozempic (0.25 or 0.5 MG/DOSE), and Polyethyl Glycol-Propyl Glycol (SYSTANE OP).   Follow-up: Follow up as ne  Terri Piedra, MSN, FNP-C Nurse Practitioner [facility] [place] number: [phone]

## 2020-04-02 NOTE — Assessment & Plan Note (Signed)
Ms. Privott is a 60 year old female with a positive hepatitis C antibody secondary to previous hepatitis C infection status post treatment through research.  Hepatitis C RNA level is undetectable.  No further evaluation/treatment is necessary as the hepatitis C virus is undetectable.  Hepatitis C antibody will always remain positive in her case and will need hepatitis C RNA level if future screening is necessary.  No further ID follow-up needed.

## 2020-04-02 NOTE — Patient Instructions (Signed)
Nice to see you.  No further treatment/testing is needed for Hepatitis C as you do not have a viral load (Hepatitis C RNA level).  If future testing is needed will need to evaluate Hepatitis C RNA level.    Have a great day and stay safe!

## 2020-04-03 ENCOUNTER — Encounter: Payer: Medicare HMO | Attending: Obstetrics and Gynecology | Admitting: Dietician

## 2020-04-03 ENCOUNTER — Encounter: Payer: Self-pay | Admitting: Dietician

## 2020-04-03 DIAGNOSIS — E1165 Type 2 diabetes mellitus with hyperglycemia: Secondary | ICD-10-CM | POA: Diagnosis present

## 2020-04-03 NOTE — Patient Instructions (Addendum)
Go to Diabetes.org for diabetes friendly recipes.  Always take your metformin on a full stomach AFTER a meal.  Try "Sticks and Stones" for a pizza/salad lunch  Check your blood sugar twice a day.  Once first thing in the morning   Once 2 hours after starting a meal  Consider working up to 150 minutes of physical activity per week.  30 minutes a day for 5 days  Work towards eating three meals a day, about 5-6 hours apart!  Begin to recognize carbohydrates in your food choices!  Have 3 carb choices at each meal (45 g).   Begin to build your meals using the proportions of the Balanced Plate. . First, select your carb choice(s) for the meal, and determine how much you should have to equal 3 carb choices (45 g). . Next, select your source of protein to pair with your carb choice(s). . Finally, complete the remaining half of your meal with a variety of non-starchy vegetables.

## 2020-04-03 NOTE — Progress Notes (Signed)
Diabetes Self-Management Education  Visit Type: First/Initial  Appt. Start Time: 1100 Appt. End Time: 1200  04/03/2020  [name], identified by name and date of birth, is a 60 y.o. female with a diagnosis of Diabetes: Type 2.   ASSESSMENT  Pt reports their BG meter was faulty and they threw it out. Has a prescription for a new meter. Dietitian provided a meter for patient. Accu-Check Guide Me  Lot# - [id number] Exp: 03/24/2021 Dietitian provided instruction for use of meter, and checked BG while pt is in appointment.  Reading was 295 mg/dL. Pt reports eating a cookie, and two oranges before the appointment. Pt reports their husband passed away in a motorcycle accident last year.  Pt reports being very stressed as a result and emotional eating over the past year.  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 Pt states they do not know what to eat or bring with them during the day.  Pt reports going to [place] in May and wants to be about 200 pounds by then (2 months)   Height 5\' 3"  (1.6 m), weight 272 lb 12.8 oz (123.7 kg). Body mass index is 48.32 kg/m.   Diabetes Self-Management Education - 04/03/20 1135      Visit Information   Visit Type First/Initial      Initial Visit   Diabetes Type Type 2    Are you currently following a meal plan? No    Are you taking your medications as prescribed? Yes      Health Coping   How would you rate your overall health? Fair      Psychosocial Assessment   Patient Belief/Attitude about Diabetes Afraid    Self-management support Doctor's office    Other persons present Patient    Patient Concerns Nutrition/Meal planning;Weight Control    Special Needs None    Learning Readiness Contemplating    How often do you need to have someone help you when you read instructions, pamphlets, or other written materials from your doctor or pharmacy? 1 - Never     What is the last grade level you completed in school? Some college      Pre-Education Assessment   Patient understands the diabetes disease and treatment process. Needs Instruction    Patient understands incorporating nutritional management into lifestyle. Needs Instruction    Patient undertands incorporating physical activity into lifestyle. Needs Instruction    Patient understands monitoring blood glucose, interpreting and using results Needs Instruction    Patient understands prevention, detection, and treatment of acute complications. Needs Instruction    Patient understands prevention, detection, and treatment of chronic complications. Needs Instruction    Patient understands how to develop strategies to address psychosocial issues. Needs Instruction    Patient understands how to develop strategies to promote health/change behavior. Needs Instruction      Complications   Last HgB A1C per patient/outside source 10 %    How often do you check your blood sugar? 0 times/day (not testing)   Pt reports meter is broken   Postprandial Blood glucose range (mg/dL) >200   RDN checked CBG during appt, 295 mg/dL   Number of hypoglycemic episodes per month 0    Number of hyperglycemic episodes per week 7   Estimate based on POC BG at appointment   Have you had a dilated eye exam in the past 12 months? Yes    Have  you had a dental exam in the past 12 months? No    Are you checking your feet? No      Exercise   Exercise Type ADL's    How many days per week to you exercise? 0    How many minutes per day do you exercise? 0    Total minutes per week of exercise 0      Patient Education   Previous Diabetes Education No    Disease state  Definition of diabetes, type 1 and 2, and the diagnosis of diabetes;Factors that contribute to the development of diabetes    Nutrition management  Role of diet in the treatment of diabetes and the relationship between the three main macronutrients and blood glucose  level;Food label reading, portion sizes and measuring food.;Carbohydrate counting    Physical activity and exercise  Role of exercise on diabetes management, blood pressure control and cardiac health.    Medications Reviewed patients medication for diabetes, action, purpose, timing of dose and side effects.    Monitoring Purpose and frequency of SMBG.;Taught/evaluated SMBG meter.    Chronic complications Relationship between chronic complications and blood glucose control    Psychosocial adjustment Role of stress on diabetes    Personal strategies to promote health Lifestyle issues that need to be addressed for better diabetes care      Individualized Goals (developed by patient)   Nutrition Follow meal plan discussed    Physical Activity Exercise 3-5 times per week    Medications take my medication as prescribed    Monitoring  test my blood glucose as discussed      Post-Education Assessment   Patient understands the diabetes disease and treatment process. Needs Review    Patient understands incorporating nutritional management into lifestyle. Needs Review    Patient undertands incorporating physical activity into lifestyle. Needs Review    Patient understands using medications safely. Needs Review    Patient understands monitoring blood glucose, interpreting and using results Needs Review    Patient understands prevention, detection, and treatment of acute complications. Needs Review    Patient understands prevention, detection, and treatment of chronic complications. Needs Review    Patient understands how to develop strategies to address psychosocial issues. Needs Review    Patient understands how to develop strategies to promote health/change behavior. Needs Review      Outcomes   Expected Outcomes Demonstrated interest in learning. Expect positive outcomes    Future DMSE 4-6 wks    Program Status Not Completed           Individualized Plan for Diabetes Self-Management  Training:   Learning Objective:  Patient will have a greater understanding of diabetes self-management. Patient education plan is to attend individual and/or group sessions per assessed needs and concerns.   Plan:   Patient Instructions  Go to Diabetes.org for diabetes friendly recipes.  Always take your metformin on a full stomach AFTER a meal.  Try "Sticks and Stones" for a pizza/salad lunch  Check your blood sugar twice a day.  Once first thing in the morning   Once 2 hours after starting a meal  Consider working up to 150 minutes of physical activity per week.  30 minutes a day for 5 days  Work towards eating three meals a day, about 5-6 hours apart!  Begin to recognize carbohydrates in your food choices!  Have 3 carb choices at each meal (45 g).   Begin to build your meals using the proportions of the  Balanced Plate. . First, select your carb choice(s) for the meal, and determine how much you should have to equal 3 carb choices (45 g). . Next, select your source of protein to pair with your carb choice(s). . Finally, complete the remaining half of your meal with a variety of non-starchy vegetables.     Expected Outcomes:  Demonstrated interest in learning. Expect positive outcomes  Education material provided: Meal plan card and My Plate  If problems or questions, patient to contact team via:  Phone and Email  Future DSME appointment: 4-6 wks

## 2020-04-08 ENCOUNTER — Other Ambulatory Visit: Payer: Self-pay

## 2020-04-08 ENCOUNTER — Telehealth: Payer: Self-pay | Admitting: Family Medicine

## 2020-04-08 DIAGNOSIS — E1165 Type 2 diabetes mellitus with hyperglycemia: Secondary | ICD-10-CM

## 2020-04-08 MED ORDER — BLOOD GLUCOSE MONITOR KIT: PACK | 0 refills | Status: DC

## 2020-04-08 NOTE — Telephone Encounter (Signed)
[name]  Call Back @ [phone]  Patient states pharmacy will not fill prescriptions for blood glucose meter kit and supplies KIT [[id number]]  Patient states the script needs to indicate lancets and testing strips not glucose meter kit and supplies  Please advise

## 2020-04-09 ENCOUNTER — Other Ambulatory Visit: Payer: Self-pay

## 2020-04-09 DIAGNOSIS — E1165 Type 2 diabetes mellitus with hyperglycemia: Secondary | ICD-10-CM

## 2020-04-09 MED ORDER — GLUCOSE BLOOD VI STRP: ORAL_STRIP | 12 refills | Status: DC

## 2020-04-09 MED ORDER — ONETOUCH DELICA PLUS LANCET33G MISC: 1 refills | Status: AC

## 2020-04-09 MED ORDER — ONETOUCH VERIO FLEX SYSTEM W/DEVICE KIT: PACK | 0 refills | Status: AC

## 2020-04-09 MED ORDER — LANCETS MISC: 1.0000 | Freq: Two times a day (BID) | 2 refills | Status: DC

## 2020-04-09 MED ORDER — ONETOUCH VERIO VI STRP: ORAL_STRIP | 1 refills | Status: AC

## 2020-04-09 NOTE — Telephone Encounter (Signed)
One touch verio meter and supplies sent in and patient notified

## 2020-04-09 NOTE — Telephone Encounter (Signed)
Rx's Sent  

## 2020-04-11 ENCOUNTER — Telehealth: Payer: Self-pay | Admitting: Family Medicine

## 2020-04-11 NOTE — Telephone Encounter (Signed)
Patient was told by [facility] a Certificate medical necessity needs to be done in order for her to get Blood Glucose Monitoring Suppl (ONETOUCH VERIO FLEX SYSTEM) w/Device KIT [[id number]]   glucose blood [facility] VERIO) test strip [[id number]]   Lancets [facility] DELICA PLUS [id number]) [place] [[id number]]

## 2020-04-12 NOTE — Telephone Encounter (Signed)
Refills sent

## 2020-05-14 ENCOUNTER — Encounter: Payer: Self-pay | Admitting: Dietician

## 2020-05-14 ENCOUNTER — Other Ambulatory Visit: Payer: Self-pay

## 2020-05-14 ENCOUNTER — Encounter: Payer: Medicare HMO | Attending: Obstetrics and Gynecology | Admitting: Dietician

## 2020-05-14 DIAGNOSIS — E1165 Type 2 diabetes mellitus with hyperglycemia: Secondary | ICD-10-CM | POA: Diagnosis not present

## 2020-05-14 NOTE — Patient Instructions (Addendum)
Have an english muffin [place] bacon and egg sandwich from [employer] instead of a biscuit.  Check your blood sugar in the morning before you eat anything. Look for reading under 125!  If you check your blood sugar 2 hours after a meal, look for a reading under 180.  Remember that physical activity ALWAYS lowers your blood sugar.  Consider taking a walk during your 9:15 am break to jump start your day!  Buy some "Steam-In-Bag" vegetables to eat with your meals.

## 2020-05-14 NOTE — Progress Notes (Signed)
Diabetes Self-Management Education  Visit Type: Follow-up  Appt. Start Time: 1015 Appt. End Time: 1050  05/14/2020  Molly Klein, identified by name and date of birth, is a 60 y.o. female with a diagnosis of Diabetes: Type 2.   ASSESSMENT Pt reports multiple people taking them out to [employer] for their birthday, and not eating well due to that. Pt reports starting to eat breakfast now, has been eating  Pt reports only checking blood sugar a couple times since our last appointment.  Pt reports checking 2 hours after a meal once and got ~160. Dietitian checked BG during appointment CBG- 254 mg/dL.  Pt repots eating a strawberry muffin before appointment. Pt reports eating more vegetables, but wants to eat more. 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 Pt reports occasional muscle cramping, can be painful. States they are concerned to it may be a side effect of one of their medications.  Height 5\' 3"  (1.6 m), weight 270 lb 3.2 oz (122.6 kg). Body mass index is 47.86 kg/m.   Diabetes Self-Management Education - 05/14/20 1034      Visit Information   Visit Type Follow-up      Initial Visit   Diabetes Type Type 2    Are you taking your medications as prescribed? Yes      Complications   How often do you check your blood sugar? 0 times/day (not testing)    Postprandial Blood glucose range (mg/dL) >200   RD checked BG during appointment, 254 mg/dL     Dietary Intake   Breakfast [place] sausage, with cheese, cheese grits, water, herbal tea    Snack (morning) none    Lunch Crab cake, and shrimp.    Snack (afternoon) none    Dinner [employer] hot fudge sundae    Snack (evening) none    Beverage(s) 4 bottles      Exercise   Exercise Type ADL's      Individualized Goals (developed by patient)   Nutrition Follow meal plan discussed    Physical Activity Exercise 3-5 times per week    Monitoring  test my blood  glucose as discussed      Patient Self-Evaluation of Goals - Patient rates self as meeting previously set goals (% of time)   Nutrition 25 - 50%    Physical Activity < 25%    Medications >75%    Monitoring < 25%    Problem Solving < 25%    Reducing Risk < 25%    Health Coping < 25%      Post-Education Assessment   Patient understands the diabetes disease and treatment process. Needs Review    Patient understands incorporating nutritional management into lifestyle. Needs Review    Patient undertands incorporating physical activity into lifestyle. Needs Review    Patient understands using medications safely. Needs Review    Patient understands monitoring blood glucose, interpreting and using results Needs Review    Patient understands prevention, detection, and treatment of acute complications. Needs Review    Patient understands prevention, detection, and treatment of chronic complications. Needs Review    Patient understands how to develop strategies to address psychosocial issues. Needs Review    Patient understands how to develop strategies to promote health/change behavior. Needs Review      Outcomes   Expected Outcomes Demonstrated interest in learning. Expect positive outcomes    Future DMSE 3-4 months    Program Status Not Completed  Subsequent Visit   Since your last visit have you continued or begun to take your medications as prescribed? Yes    Since your last visit have you had your blood pressure checked? Yes    Is your most recent blood pressure lower, unchanged, or higher since your last visit? Unchanged    Since your last visit have you experienced any weight changes? Loss    Weight Loss (lbs) 2.5    Since your last visit, are you checking your blood glucose at least once a day? No   Checking very rarely          Individualized Plan for Diabetes Self-Management Training:   Learning Objective:  Patient will have a greater understanding of diabetes  self-management. Patient education plan is to attend individual and/or group sessions per assessed needs and concerns.   Plan:   Patient Instructions  Have an english muffin [place] bacon and egg sandwich from [employer] instead of a biscuit.  Check your blood sugar in the morning before you eat anything. Look for reading under 125!  If you check your blood sugar 2 hours after a meal, look for a reading under 180.  Remember that physical activity ALWAYS lowers your blood sugar.  Consider taking a walk during your 9:15 am break to jump start your day!  Buy some "Steam-In-Bag" vegetables to eat with your meals.   Expected Outcomes:  Demonstrated interest in learning. Expect positive outcomes  If problems or questions, patient to contact team via:  Phone and Email  Future DSME appointment: 3-4 months

## 2020-06-24 ENCOUNTER — Other Ambulatory Visit: Payer: Self-pay | Admitting: Family Medicine

## 2020-06-24 DIAGNOSIS — I10 Essential (primary) hypertension: Secondary | ICD-10-CM

## 2020-06-25 ENCOUNTER — Encounter: Payer: Self-pay | Admitting: Family Medicine

## 2020-06-25 ENCOUNTER — Other Ambulatory Visit: Payer: Self-pay | Admitting: Family Medicine

## 2020-06-25 ENCOUNTER — Ambulatory Visit: Payer: Medicare HMO | Admitting: Family Medicine

## 2020-06-25 ENCOUNTER — Other Ambulatory Visit: Payer: Self-pay

## 2020-06-25 VITALS — BP 130/82 | HR 91 | Temp 98.9°F | Resp 18 | Ht 63.0 in | Wt 272.2 lb

## 2020-06-25 DIAGNOSIS — E1165 Type 2 diabetes mellitus with hyperglycemia: Secondary | ICD-10-CM | POA: Diagnosis not present

## 2020-06-25 DIAGNOSIS — Z1211 Encounter for screening for malignant neoplasm of colon: Secondary | ICD-10-CM

## 2020-06-25 DIAGNOSIS — E1169 Type 2 diabetes mellitus with other specified complication: Secondary | ICD-10-CM | POA: Diagnosis not present

## 2020-06-25 DIAGNOSIS — E785 Hyperlipidemia, unspecified: Secondary | ICD-10-CM

## 2020-06-25 DIAGNOSIS — Z23 Encounter for immunization: Secondary | ICD-10-CM

## 2020-06-25 DIAGNOSIS — I10 Essential (primary) hypertension: Secondary | ICD-10-CM

## 2020-06-25 LAB — LIPID PANEL
Cholesterol: 119 mg/dL (ref 0–200)
HDL: 42.5 mg/dL (ref >39.00)
LDL Cholesterol: 48 mg/dL (ref 0–99)
NonHDL: 76.22
Total CHOL/HDL Ratio: 3
Triglycerides: 140 mg/dL (ref 0.0–149.0)
VLDL: 28 mg/dL (ref 0.0–40.0)

## 2020-06-25 LAB — COMPREHENSIVE METABOLIC PANEL
ALT: 28 U/L (ref 0–35)
AST: 19 U/L (ref 0–37)
Albumin: 4.1 g/dL (ref 3.5–5.2)
Alkaline Phosphatase: 45 U/L (ref 39–117)
BUN: 11 mg/dL (ref 6–23)
CO2: 31 mEq/L (ref 19–32)
Calcium: 9.5 mg/dL (ref 8.4–10.5)
Chloride: 97 mEq/L (ref 96–112)
Creatinine, Ser: 0.75 mg/dL (ref 0.40–1.20)
GFR: 85.51 mL/min (ref >60.00)
Glucose, Bld: 245 mg/dL — ABNORMAL HIGH (ref 70–99)
Potassium: 3.4 mEq/L — ABNORMAL LOW (ref 3.5–5.1)
Sodium: 137 mEq/L (ref 135–145)
Total Bilirubin: 0.6 mg/dL (ref 0.2–1.2)
Total Protein: 7 g/dL (ref 6.0–8.3)

## 2020-06-25 LAB — HEMOGLOBIN A1C: Hgb A1c MFr Bld: 8.2 % — ABNORMAL HIGH (ref 4.6–6.5)

## 2020-06-25 MED ORDER — HYDROCHLOROTHIAZIDE 25 MG PO TABS: ORAL_TABLET | ORAL | 3 refills | Status: DC

## 2020-06-25 MED ORDER — LISINOPRIL 5 MG PO TABS: 5.0000 mg | ORAL_TABLET | Freq: Every day | ORAL | 3 refills | Status: DC

## 2020-06-25 MED ORDER — ROSUVASTATIN CALCIUM 10 MG PO TABS
10.0000 mg | ORAL_TABLET | Freq: Every day | ORAL | 3 refills | Status: DC
Start: 2020-06-25 — End: 2021-04-02

## 2020-06-25 MED ORDER — OZEMPIC (0.25 OR 0.5 MG/DOSE) 2 MG/1.5ML ~~LOC~~ SOPN: PEN_INJECTOR | SUBCUTANEOUS | 3 refills | Status: DC

## 2020-06-25 NOTE — Assessment & Plan Note (Signed)
con't with diet and exercise ozempic is helping her weight loss

## 2020-06-25 NOTE — Assessment & Plan Note (Signed)
Well controlled, no changes to meds. Encouraged heart healthy diet such as the DASH diet and exercise as tolerated.  °

## 2020-06-25 NOTE — Progress Notes (Signed)
Subjective:   By signing my name below, I, [name], attest that this documentation has been prepared under the direction and in the presence of Dr. Roma Schanz, DO. 06/25/2020      Patient ID: Molly Klein, female    DOB: [birth year], 60 y.o.   MRN: [id number]  Chief Complaint  Patient presents with  . Hypertension  . Hyperlipidemia  . Diabetes  . Follow-up    HPI Patient is in today for office visit.  She complains of constipation. Has not taken anything to manage her symptoms at this time. She is requesting a refill for her 25 mg hydrochlorothiazide daily PO to manage her hypertension and 10 mg Crestor daily PO to manage her hyperlipidemia. She is also requesting needles for her Ozempic to manage her diabetes. She continues taking 500 mg metformin daily PO to manage her diabetes. She has not measured her blood sugar recently. She denies having any fever, ear pain, congestion, sinus pain, sore throat, eye pain, chest pain, palpations, cough, SOB, wheezing, n/v/d, blood in stool, dysuria, frequency, hematuria, dizziness, or headaches at this time. She is due for a colonoscopy. She is due for a tetanus vaccine and a pneumonia vaccine. She has 3 Covid-19 vaccines and is UTD. She continues to manage her diet and exercise to try losing weight.   Past Medical History:  Diagnosis Date  . Asthma   . Bruising 10/21/2012  . Cervical spinal stenosis    L4-5  . DVT (deep venous thrombosis) ([place])   . GERD (gastroesophageal reflux disease)   . Hepatitis C    Treated and cured per pt  . History of DVT of lower extremity 10/21/2012  . Hypertension   . Knee pain    left   . Neuromuscular disorder (HCC)    neuropathy lt arm  . Nipple discharge    right breast  . OSA (obstructive sleep apnea)     Past Surgical History:  Procedure Laterality Date  . ABDOMINAL HYSTERECTOMY    . ANTERIOR CERVICAL DECOMP/DISCECTOMY FUSION N/A 07/20/2018   Procedure: c4-5ANTERIOR CERVICAL  DECOMPRESSION/DISCECTOMY FUSION, ALLOGRAFT & PLATE;  Surgeon: Marybelle Killings, MD;  Location: [facility];  Service: Orthopedics;  Laterality: N/A;  . BREAST BIOPSY  02/06/2011   Procedure: BREAST BIOPSY;  Surgeon: Earnstine Regal, MD;  Location: [facility];  Service: General;  Laterality: Right;  right breast biopsy  . CHOLECYSTECTOMY    . DILATION AND CURETTAGE OF UTERUS    . FRACTURE SURGERY     fx lt arm  . Nipple repair    . PILONIDAL CYST EXCISION    . right knee surgery    . TONSILLECTOMY      Family History  Problem Relation Age of Onset  . Heart attack Mother        mi in 17s  . Alcohol abuse Mother        cirrhosis of liver  . Heart attack Father        MI in 78s  . Rectal cancer Maternal Grandmother   . Cancer Maternal Grandmother        breast  . Cancer Sister        pt unaware of what kind    Social History   Socioeconomic History  . Marital status: Married    Spouse name: Not on file  . Number of children: Not on file  . Years of education: Not on file  . Highest education level: Not on  file  Occupational History  . Occupation: retired    Fish farm manager: DISABLED  Tobacco Use  . Smoking status: Former Smoker    Years: 5.00    Types: Cigarettes    Quit date: 02/03/1975    Years since quitting: 45.4  . Smokeless tobacco: Never Used  . Tobacco comment: 1 pack per week  Vaping Use  . Vaping Use: Never used  Substance and Sexual Activity  . Alcohol use: No    Alcohol/week: 0.0 standard drinks  . Drug use: No  . Sexual activity: Yes    Partners: Male  Other Topics Concern  . Not on file  Social History Narrative   Exercise-- 2-3 days a week   Social Determinants of Health   Financial Resource Strain: Not on file  Food Insecurity: Not on file  Transportation Needs: Not on file  Physical Activity: Not on file  Stress: Not on file  Social Connections: Not on file  Intimate Partner Violence: Not on file    Outpatient Medications Prior to Visit   Medication Sig Dispense Refill  . albuterol (PROVENTIL HFA;VENTOLIN HFA) 108 (90 Base) MCG/ACT inhaler Inhale 2 puffs into the lungs 4 (four) times daily as needed. Shortness of breath (Patient taking differently: Inhale 2 puffs into the lungs 4 (four) times daily as needed for wheezing or shortness of breath.) 1 Inhaler 6  . albuterol (PROVENTIL) (2.5 MG/3ML) 0.083% nebulizer solution INHALE 1 VIAL BY NEBULIZER AS DIRECTED (Patient taking differently: Take 2.5 mg by nebulization 4 (four) times daily as needed for wheezing or shortness of breath.) 75 mL 0  . Blood Glucose Monitoring Suppl (ONETOUCH VERIO FLEX SYSTEM) w/Device KIT Check blood sugar up to 4 times a day. Dx Code: E11.65 1 kit 0  . glucose blood (ONETOUCH VERIO) test strip Check blood sugar up to 4 times a day. Dx Code: E11.65 400 each 1  . Lancets (ONETOUCH DELICA PLUS GGYIRS85I) MISC Check blood sugar up to 4 times a day. Dx Code: E11.65 400 each 1  . metFORMIN (GLUCOPHAGE) 500 MG tablet Take 1 tablet (500 mg total) by mouth 2 (two) times daily with a meal. 90 tablet 3  . Polyethyl Glycol-Propyl Glycol (SYSTANE OP) Apply to eye.    . valACYclovir (VALTREX) 1000 MG tablet TAKE 1 TABLET BY MOUTH THREE TIMES DAILY FOR 7 DAYS THEN TAKE 1 TABLET BY MOUTH EVERY DAY 42 tablet 5  . hydrochlorothiazide (HYDRODIURIL) 25 MG tablet TAKE 1 TABLET(25 MG) BY MOUTH DAILY 30 tablet 2  . OZEMPIC, 0.25 OR 0.5 MG/DOSE, 2 MG/1.5ML SOPN 0.25 mg sq weekly 1.5 mL 3  . rosuvastatin (CRESTOR) 10 MG tablet Take 1 tablet (10 mg total) by mouth daily. 90 tablet 3   No facility-administered medications prior to visit.    Allergies  Allergen Reactions  . Codeine Nausea And Vomiting  . Interferon Beta-1a Nausea And Vomiting and Other (See Comments)    Severe flu-like symptoms Symptoms expected  . Sulfonamide Derivatives Nausea Only    Review of Systems  Constitutional: Negative for fever.  HENT: Negative for congestion, ear pain, sinus pain and sore  throat.   Eyes: Negative for pain.  Respiratory: Negative for cough, shortness of breath and wheezing.   Cardiovascular: Negative for chest pain and palpitations.  Gastrointestinal: Positive for constipation. Negative for blood in stool, diarrhea, nausea and vomiting.  Genitourinary: Negative for dysuria, frequency and hematuria.  Neurological: Negative for dizziness and headaches.       Objective:    Physical  Exam Constitutional:      General: She is not in acute distress.    Appearance: Normal appearance. She is not ill-appearing.  HENT:     Head: Normocephalic and atraumatic.     Right Ear: External ear normal.     Left Ear: External ear normal.  Eyes:     Extraocular Movements: Extraocular movements intact.     Pupils: Pupils are equal, round, and reactive to light.  Cardiovascular:     Rate and Rhythm: Normal rate and regular rhythm.     Pulses: Normal pulses.     Heart sounds: Normal heart sounds. No murmur heard. No gallop.   Pulmonary:     Effort: Pulmonary effort is normal. No respiratory distress.     Breath sounds: Normal breath sounds. No wheezing, rhonchi or rales.  Feet:     Comments:     Skin:    General: Skin is warm and dry.  Neurological:     Mental Status: She is alert and oriented to person, place, and time.  Psychiatric:        Behavior: Behavior normal.     BP 130/82 (BP Location: Right Arm, Patient Position: Sitting, Cuff Size: Large)   Pulse 91   Temp 98.9 F (37.2 C) (Oral)   Resp 18   Ht _0  (1.6 m)   Wt 272 lb 3.2 oz (123.5 kg)   SpO2 97%   BMI 48.22 kg/m  Wt Readings from Last 3 Encounters:  06/25/20 272 lb 3.2 oz (123.5 kg)  05/14/20 270 lb 3.2 oz (122.6 kg)  04/03/20 272 lb 12.8 oz (123.7 kg)    Diabetic Foot Exam - Simple   No data filed    Lab Results  Component Value Date   WBC 3.3 (L) 07/14/2018   HGB 14.3 07/14/2018   HCT 41.3 07/14/2018   PLT 185 07/14/2018   GLUCOSE 298 (H) 03/28/2020   CHOL 170 09/05/2015    TRIG 60.0 09/05/2015   HDL 54.40 09/05/2015   LDLCALC 103 (H) 09/05/2015   ALT 45 (H) 03/28/2020   AST 28 03/28/2020   NA 137 03/28/2020   K 4.1 03/28/2020   CL 98 03/28/2020   CREATININE 0.79 03/28/2020   BUN 8 03/28/2020   CO2 29 03/28/2020   TSH 1.65 09/05/2015   INR 1.04 02/11/2010   HGBA1C 7.2 (H) 07/20/2018   MICROALBUR 1.0 03/28/2020    Lab Results  Component Value Date   TSH 1.65 09/05/2015   Lab Results  Component Value Date   WBC 3.3 (L) 07/14/2018   HGB 14.3 07/14/2018   HCT 41.3 07/14/2018   MCV 85.3 07/14/2018   PLT 185 07/14/2018   Lab Results  Component Value Date   NA 137 03/28/2020   K 4.1 03/28/2020   CO2 29 03/28/2020   GLUCOSE 298 (H) 03/28/2020   BUN 8 03/28/2020   CREATININE 0.79 03/28/2020   BILITOT 0.5 03/28/2020   ALKPHOS 58 03/28/2020   AST 28 03/28/2020   ALT 45 (H) 03/28/2020   PROT 6.9 03/28/2020   ALBUMIN 4.0 03/28/2020   CALCIUM 9.5 03/28/2020   ANIONGAP 10 07/21/2018   GFR 80.48 03/28/2020   Lab Results  Component Value Date   CHOL 170 09/05/2015   Lab Results  Component Value Date   HDL 54.40 09/05/2015   Lab Results  Component Value Date   LDLCALC 103 (H) 09/05/2015   Lab Results  Component Value Date   TRIG 60.0 09/05/2015  Lab Results  Component Value Date   CHOLHDL 3 09/05/2015   Lab Results  Component Value Date   HGBA1C 7.2 (H) 07/20/2018       Assessment & Plan:   Problem List Items Addressed This Visit      Unprioritized   Essential hypertension    Well controlled, no changes to meds. Encouraged heart healthy diet such as the DASH diet and exercise as tolerated.       Relevant Medications   rosuvastatin (CRESTOR) 10 MG tablet   hydrochlorothiazide (HYDRODIURIL) 25 MG tablet   lisinopril (ZESTRIL) 5 MG tablet   Hyperlipidemia associated with type 2 diabetes mellitus (HCC)    Encouraged heart healthy diet, increase exercise, avoid trans fats, consider a krill oil cap daily       Relevant Medications   rosuvastatin (CRESTOR) 10 MG tablet   OZEMPIC, 0.25 OR 0.5 MG/DOSE, 2 MG/1.5ML SOPN   lisinopril (ZESTRIL) 5 MG tablet   Other Relevant Orders   Lipid panel   Comprehensive metabolic panel   Severe obesity (BMI >= 40) (HCC)    con't with diet and exercise ozempic is helping her weight loss      Relevant Medications   OZEMPIC, 0.25 OR 0.5 MG/DOSE, 2 MG/1.5ML SOPN   Uncontrolled type 2 diabetes mellitus with hyperglycemia ([place])    hgba1c to be checked , minimize simple carbs. Increase exercise as tolerated. Continue current meds       Relevant Medications   rosuvastatin (CRESTOR) 10 MG tablet   OZEMPIC, 0.25 OR 0.5 MG/DOSE, 2 MG/1.5ML SOPN   lisinopril (ZESTRIL) 5 MG tablet   Other Relevant Orders   Lipid panel   Hemoglobin A1c   Comprehensive metabolic panel    Other Visit Diagnoses    Primary hypertension    -  Primary   Relevant Medications   rosuvastatin (CRESTOR) 10 MG tablet   hydrochlorothiazide (HYDRODIURIL) 25 MG tablet   lisinopril (ZESTRIL) 5 MG tablet   Other Relevant Orders   Lipid panel   Comprehensive metabolic panel   Need for Tdap vaccination       Need for pneumococcal vaccination           Meds ordered this encounter  Medications  . rosuvastatin (CRESTOR) 10 MG tablet    Sig: Take 1 tablet (10 mg total) by mouth daily.    Dispense:  90 tablet    Refill:  3  . hydrochlorothiazide (HYDRODIURIL) 25 MG tablet    Sig: TAKE 1 TABLET(25 MG) BY MOUTH DAILY    Dispense:  90 tablet    Refill:  3  . OZEMPIC, 0.25 OR 0.5 MG/DOSE, 2 MG/1.5ML SOPN    Sig: 0.25 mg sq weekly    Dispense:  1.5 mL    Refill:  3  . lisinopril (ZESTRIL) 5 MG tablet    Sig: Take 1 tablet (5 mg total) by mouth daily.    Dispense:  90 tablet    Refill:  3    I, Dr. Roma Schanz, DO, personally preformed the services described in this documentation.  All medical record entries made by the scribe were at my direction and in my presence.  I have  reviewed the chart and discharge instructions (if applicable) and agree that the record reflects my personal performance and is accurate and complete. 06/25/2020   I,[name],acting as a scribe for [name], DO.,have documented all relevant documentation on the behalf of [name], DO,as directed by  Kendrick Fries  R Lowne Chase, DO while in the presence of [employer], DO.   [name], DO

## 2020-06-25 NOTE — Patient Instructions (Signed)

## 2020-06-25 NOTE — Assessment & Plan Note (Signed)
Encouraged heart healthy diet, increase exercise, avoid trans fats, consider a krill oil cap daily 

## 2020-06-25 NOTE — Assessment & Plan Note (Signed)
hgba1c to be checked, minimize simple carbs. Increase exercise as tolerated. Continue current meds  

## 2020-06-27 ENCOUNTER — Other Ambulatory Visit: Payer: Self-pay | Admitting: Family Medicine

## 2020-06-27 DIAGNOSIS — E1165 Type 2 diabetes mellitus with hyperglycemia: Secondary | ICD-10-CM

## 2020-06-27 DIAGNOSIS — I10 Essential (primary) hypertension: Secondary | ICD-10-CM

## 2020-06-28 ENCOUNTER — Other Ambulatory Visit: Payer: Self-pay | Admitting: Family Medicine

## 2020-06-28 DIAGNOSIS — E1165 Type 2 diabetes mellitus with hyperglycemia: Secondary | ICD-10-CM

## 2020-06-28 DIAGNOSIS — E1169 Type 2 diabetes mellitus with other specified complication: Secondary | ICD-10-CM

## 2020-06-28 DIAGNOSIS — E785 Hyperlipidemia, unspecified: Secondary | ICD-10-CM

## 2020-08-13 ENCOUNTER — Ambulatory Visit: Payer: Medicare HMO | Admitting: Dietician

## 2020-08-14 ENCOUNTER — Encounter: Payer: Medicare HMO | Attending: Obstetrics and Gynecology | Admitting: Dietician

## 2020-08-14 ENCOUNTER — Encounter: Payer: Self-pay | Admitting: Dietician

## 2020-08-14 ENCOUNTER — Other Ambulatory Visit: Payer: Self-pay

## 2020-08-14 DIAGNOSIS — E1165 Type 2 diabetes mellitus with hyperglycemia: Secondary | ICD-10-CM | POA: Diagnosis not present

## 2020-08-14 NOTE — Patient Instructions (Signed)
Keep up the great work!  Work towards eating three meals a day, about 5-6 hours apart!  Begin to build your meals using the proportions of the Balanced Plate. First, select your carb choice(s) for the meal. Next, select your source of protein to pair with your carb choice(s). Finally, complete the remaining half of your meal with a variety of non-starchy vegetables.

## 2020-08-14 NOTE — Progress Notes (Signed)
Diabetes Self-Management Education  Visit Type: Follow-up  Appt. Start Time: 1030 Appt. End Time: 1100  08/14/2020  [name], identified by name and date of birth, is a 60 y.o. female with a diagnosis of Diabetes: Type 2.   ASSESSMENT Pt A1c has dropped to 8.2 from 10. Pt has lost 4 pounds since last visit. Pt reports trying an [employer] appetite suppressant shake that worked very well. Pt is considering continuing to use [employer] supplements. Pt has been on multiple vacations, states stress level is very low. Pt is compliant with medications. Pt is not currently checking BG. Dietitian checked BG during appointment CBG- 166 mg/dL.   Height 5\' 3"  (1.6 m), weight 266 lb 3.2 oz (120.7 kg). Body mass index is 47.16 kg/m.   Diabetes Self-Management Education - 08/14/20 1000       Visit Information   Visit Type Follow-up      Initial Visit   Diabetes Type Type 2    Are you taking your medications as prescribed? Yes      Complications   Last HgB A1C per patient/outside source 8.2 %    How often do you check your blood sugar? 0 times/day (not testing)    Postprandial Blood glucose range (mg/dL) 130-179      Dietary Intake   Breakfast Ceasar salad    Lunch [employer] Quarter pounder, apple pie, water    Snack (afternoon) none    Dinner none    Snack (evening) none    Beverage(s) water      Exercise   Exercise Type ADL's      Individualized Goals (developed by patient)   Monitoring  test my blood glucose as discussed      Patient Self-Evaluation of Goals - Patient rates self as meeting previously set goals (% of time)   Nutrition 25 - 50%    Physical Activity 25 - 50%    Medications >75%    Monitoring < 25%    Problem Solving < 25%    Reducing Risk < 25%    Health Coping < 25%      Post-Education Assessment   Patient understands the diabetes disease and treatment process. Needs Review    Patient understands incorporating nutritional management into  lifestyle. Needs Review    Patient undertands incorporating physical activity into lifestyle. Needs Review    Patient understands using medications safely. Needs Review    Patient understands monitoring blood glucose, interpreting and using results Needs Review    Patient understands prevention, detection, and treatment of acute complications. Needs Review    Patient understands prevention, detection, and treatment of chronic complications. Needs Review    Patient understands how to develop strategies to address psychosocial issues. Needs Review    Patient understands how to develop strategies to promote health/change behavior. Needs Review      Outcomes   Expected Outcomes Demonstrated interest in learning. Expect positive outcomes    Future DMSE 3-4 months    Program Status Not Completed      Subsequent Visit   Since your last visit have you continued or begun to take your medications as prescribed? Yes    Since your last visit have you had your blood pressure checked? No    Since your last visit, are you checking your blood glucose at least once a day? No             Individualized Plan for Diabetes Self-Management Training:   Learning Objective:  Patient will  have a greater understanding of diabetes self-management. Patient education plan is to attend individual and/or group sessions per assessed needs and concerns.   Plan:   Patient Instructions  Keep up the great work!  Work towards eating three meals a day, about 5-6 hours apart!  Begin to build your meals using the proportions of the Balanced Plate. First, select your carb choice(s) for the meal. Next, select your source of protein to pair with your carb choice(s). Finally, complete the remaining half of your meal with a variety of non-starchy vegetables.   Expected Outcomes:  Demonstrated interest in learning. Expect positive outcomes  Education material provided: My Plate  If problems or questions, patient to  contact team via:  Phone and Email  Future DSME appointment: 3-4 months

## 2020-09-06 ENCOUNTER — Encounter: Payer: Self-pay | Admitting: Family Medicine

## 2020-09-06 ENCOUNTER — Telehealth (INDEPENDENT_AMBULATORY_CARE_PROVIDER_SITE_OTHER): Payer: Medicare HMO | Admitting: Family Medicine

## 2020-09-06 ENCOUNTER — Other Ambulatory Visit: Payer: Self-pay

## 2020-09-06 DIAGNOSIS — I10 Essential (primary) hypertension: Secondary | ICD-10-CM | POA: Diagnosis not present

## 2020-09-06 DIAGNOSIS — J029 Acute pharyngitis, unspecified: Secondary | ICD-10-CM

## 2020-09-06 MED ORDER — AZITHROMYCIN 250 MG PO TABS: ORAL_TABLET | ORAL | 0 refills | Status: DC

## 2020-09-06 NOTE — Progress Notes (Signed)
MyChart Video Visit    Virtual Visit via Video Note   This visit type was conducted due to national recommendations for restrictions regarding the COVID-19 Pandemic (e.g. social distancing) in an effort to limit this patient's exposure and mitigate transmission in our community. This patient is at least at moderate risk for complications without adequate follow up. This format is felt to be most appropriate for this patient at this time. Physical exam was limited by quality of the video and audio technology used for the visit. Molly Klein was able to get the patient set up on a video visit.  Patient location: Home Patient and provider in visit Provider location: Office  I discussed the limitations of evaluation and management by telemedicine and the availability of in person appointments. The patient expressed understanding and agreed to proceed.  Visit Date: 09/06/2020  Today's healthcare provider: [name], DO      Subjective:    Patient ID: Molly Klein, female    DOB: [birth year], 60 y.o.   MRN: [id number]  Chief Complaint  Patient presents with   Cough    Cough and congestion x 1 week. Was diagnosed with acute pharyngitis while in [place] last week.     HPI Patient is in today for a video visit.  She recently came back from a trip to [place] and while on the trip, she stayed in a room where the air was always on . She started having a fever and was experiencing chills. She saw a doctor there and he diagnosed her with pharyngitis. She was prescribed Augmentin 875 mg 2x daily and tessalon perles. She mentions that although the fever has been resolved, she still has a sore throat and a cough. She has been using OTC robitussin which makes the cough productive. She has been taking Coricidin cough syrup given to her by her sister and mentions it brings great relief.  She got tested for Covid-19 before she went to [place] and the results were negative. She was  tested for Covid-19 in [place] and was also negative. She has been trying to schedule another Covid test recently since she came back and has had no success.   Past Medical History:  Diagnosis Date   Asthma    Bruising 10/21/2012   Cervical spinal stenosis    L4-5   DVT (deep venous thrombosis) (HCC)    GERD (gastroesophageal reflux disease)    Hepatitis C    Treated and cured per pt   History of DVT of lower extremity 10/21/2012   Hypertension    Knee pain    left    Neuromuscular disorder (HCC)    neuropathy lt arm   Nipple discharge    right breast   OSA (obstructive sleep apnea)     Past Surgical History:  Procedure Laterality Date   ABDOMINAL HYSTERECTOMY     ANTERIOR CERVICAL DECOMP/DISCECTOMY FUSION N/A 07/20/2018   Procedure: c4-5ANTERIOR CERVICAL DECOMPRESSION/DISCECTOMY FUSION, ALLOGRAFT & PLATE;  Surgeon: Marybelle Killings, MD;  Location: [facility];  Service: Orthopedics;  Laterality: N/A;   BREAST BIOPSY  02/06/2011   Procedure: BREAST BIOPSY;  Surgeon: Earnstine Regal, MD;  Location: [facility];  Service: General;  Laterality: Right;  right breast biopsy   CHOLECYSTECTOMY     DILATION AND CURETTAGE OF UTERUS     FRACTURE SURGERY     fx lt arm   Nipple repair     PILONIDAL CYST EXCISION  right knee surgery     TONSILLECTOMY      Family History  Problem Relation Age of Onset   Heart attack Mother        mi in 13s   Alcohol abuse Mother        cirrhosis of liver   Heart attack Father        MI in 40s   Rectal cancer Maternal Grandmother    Cancer Maternal Grandmother        breast   Cancer Sister        pt unaware of what kind    Social History   Socioeconomic History   Marital status: Married    Spouse name: Not on file   Number of children: Not on file   Years of education: Not on file   Highest education level: Not on file  Occupational History   Occupation: retired    Fish farm manager: DISABLED  Tobacco Use   Smoking status: Former    Years:  5.00    Types: Cigarettes    Quit date: 02/03/1975    Years since quitting: 45.6   Smokeless tobacco: Never   Tobacco comments:    1 pack per week  Vaping Use   Vaping Use: Never used  Substance and Sexual Activity   Alcohol use: No    Alcohol/week: 0.0 standard drinks   Drug use: No   Sexual activity: Yes    Partners: Male  Other Topics Concern   Not on file  Social History Narrative   Exercise-- 2-3 days a week   Social Determinants of Health   Financial Resource Strain: Not on file  Food Insecurity: Not on file  Transportation Needs: Not on file  Physical Activity: Not on file  Stress: Not on file  Social Connections: Not on file  Intimate Partner Violence: Not on file    Outpatient Medications Prior to Visit  Medication Sig Dispense Refill   albuterol (PROVENTIL HFA;VENTOLIN HFA) 108 (90 Base) MCG/ACT inhaler Inhale 2 puffs into the lungs 4 (four) times daily as needed. Shortness of breath (Patient taking differently: Inhale 2 puffs into the lungs 4 (four) times daily as needed for wheezing or shortness of breath.) 1 Inhaler 6   albuterol (PROVENTIL) (2.5 MG/3ML) 0.083% nebulizer solution INHALE 1 VIAL BY NEBULIZER AS DIRECTED (Patient taking differently: Take 2.5 mg by nebulization 4 (four) times daily as needed for wheezing or shortness of breath.) 75 mL 0   Blood Glucose Monitoring Suppl (ONETOUCH VERIO FLEX SYSTEM) w/Device KIT Check blood sugar up to 4 times a day. Dx Code: E11.65 1 kit 0   glucose blood (ONETOUCH VERIO) test strip Check blood sugar up to 4 times a day. Dx Code: E11.65 400 each 1   hydrochlorothiazide (HYDRODIURIL) 25 MG tablet TAKE 1 TABLET(25 MG) BY MOUTH DAILY 90 tablet 1   Lancets (ONETOUCH DELICA PLUS NWGNFA21H) MISC Check blood sugar up to 4 times a day. Dx Code: E11.65 400 each 1   lisinopril (ZESTRIL) 5 MG tablet Take 1 tablet (5 mg total) by mouth daily. 90 tablet 3   metFORMIN (GLUCOPHAGE) 500 MG tablet TAKE 1 TABLET(500 MG) BY MOUTH TWICE  DAILY WITH A MEAL 90 tablet 1   OZEMPIC, 0.25 OR 0.5 MG/DOSE, 2 MG/1.5ML SOPN 0.25 mg sq weekly 1.5 mL 3   Polyethyl Glycol-Propyl Glycol (SYSTANE OP) Apply to eye.     rosuvastatin (CRESTOR) 10 MG tablet Take 1 tablet (10 mg total) by mouth daily. 90 tablet 3  valACYclovir (VALTREX) 1000 MG tablet TAKE 1 TABLET BY MOUTH THREE TIMES DAILY FOR 7 DAYS THEN TAKE 1 TABLET BY MOUTH EVERY DAY 42 tablet 5   No facility-administered medications prior to visit.    Allergies  Allergen Reactions   Codeine Nausea And Vomiting   Interferon Beta-1a Nausea And Vomiting and Other (See Comments)    Severe flu-like symptoms Symptoms expected   Sulfonamide Derivatives Nausea Only    Review of Systems  Constitutional:  Negative for fever and malaise/fatigue.  HENT:  Positive for sore throat. Negative for congestion.   Eyes:  Negative for blurred vision.  Respiratory:  Positive for cough. Negative for shortness of breath.   Cardiovascular:  Negative for chest pain, palpitations and leg swelling.  Gastrointestinal:  Negative for abdominal pain, blood in stool and nausea.  Genitourinary:  Negative for dysuria and frequency.  Musculoskeletal:  Negative for falls.  Skin:  Negative for rash.  Neurological:  Negative for dizziness, loss of consciousness and headaches.  Endo/Heme/Allergies:  Negative for environmental allergies.  Psychiatric/Behavioral:  Negative for depression. The patient is not nervous/anxious.       Objective:    Physical Exam Vitals and nursing note reviewed.  Pulmonary:     Effort: Pulmonary effort is normal.  Neurological:     General: No focal deficit present.    There were no vitals taken for this visit. Wt Readings from Last 3 Encounters:  08/14/20 266 lb 3.2 oz (120.7 kg)  06/25/20 272 lb 3.2 oz (123.5 kg)  05/14/20 270 lb 3.2 oz (122.6 kg)    Diabetic Foot Exam - Simple   No data filed    Lab Results  Component Value Date   WBC 3.3 (L) 07/14/2018   HGB 14.3  07/14/2018   HCT 41.3 07/14/2018   PLT 185 07/14/2018   GLUCOSE 245 (H) 06/25/2020   CHOL 119 06/25/2020   TRIG 140.0 06/25/2020   HDL 42.50 06/25/2020   LDLCALC 48 06/25/2020   ALT 28 06/25/2020   AST 19 06/25/2020   NA 137 06/25/2020   K 3.4 (L) 06/25/2020   CL 97 06/25/2020   CREATININE 0.75 06/25/2020   BUN 11 06/25/2020   CO2 31 06/25/2020   TSH 1.65 09/05/2015   INR 1.04 02/11/2010   HGBA1C 8.2 (H) 06/25/2020   MICROALBUR 1.0 03/28/2020    Lab Results  Component Value Date   TSH 1.65 09/05/2015   Lab Results  Component Value Date   WBC 3.3 (L) 07/14/2018   HGB 14.3 07/14/2018   HCT 41.3 07/14/2018   MCV 85.3 07/14/2018   PLT 185 07/14/2018   Lab Results  Component Value Date   NA 137 06/25/2020   K 3.4 (L) 06/25/2020   CO2 31 06/25/2020   GLUCOSE 245 (H) 06/25/2020   BUN 11 06/25/2020   CREATININE 0.75 06/25/2020   BILITOT 0.6 06/25/2020   ALKPHOS 45 06/25/2020   AST 19 06/25/2020   ALT 28 06/25/2020   PROT 7.0 06/25/2020   ALBUMIN 4.1 06/25/2020   CALCIUM 9.5 06/25/2020   ANIONGAP 10 07/21/2018   GFR 85.51 06/25/2020   Lab Results  Component Value Date   CHOL 119 06/25/2020   Lab Results  Component Value Date   HDL 42.50 06/25/2020   Lab Results  Component Value Date   LDLCALC 48 06/25/2020   Lab Results  Component Value Date   TRIG 140.0 06/25/2020   Lab Results  Component Value Date   CHOLHDL 3 06/25/2020  Lab Results  Component Value Date   HGBA1C 8.2 (H) 06/25/2020       Assessment & Plan:   Problem List Items Addressed This Visit       Unprioritized   Pharyngitis - Primary    abx per ordered covid test neg F/u in office if no better        Relevant Medications   azithromycin (ZITHROMAX Z-PAK) 250 MG tablet   Other Visit Diagnoses     Primary hypertension            Meds ordered this encounter  Medications   azithromycin (ZITHROMAX Z-PAK) 250 MG tablet    Sig: As directed    Dispense:  6 each     Refill:  0    I discussed the assessment and treatment plan with the patient. The patient was provided an opportunity to ask questions and all were answered. The patient agreed with the plan and demonstrated an understanding of the instructions.   The patient was advised to call back or seek an in-person evaluation if the symptoms worsen or if the condition fails to improve as anticipated.  I provided 20 minutes of face-to-face time during this encounter.   I,Zite Okoli,acting as a Education administrator for [employer], DO.,have documented all relevant documentation on the behalf of [name], DO,as directed by  [name], DO while in the presence of [place], DO, have reviewed all documentation for this visit. The documentation on 09/06/20 for the exam, diagnosis, procedures, and orders are all accurate and complete.    [name], DO [place] [facility] [employer] [facility] [phone] (phone) [phone] (fax)  [place]

## 2020-09-06 NOTE — Assessment & Plan Note (Signed)
abx per ordered covid test neg F/u in office if no better

## 2020-10-12 ENCOUNTER — Ambulatory Visit (INDEPENDENT_AMBULATORY_CARE_PROVIDER_SITE_OTHER): Payer: Medicare HMO

## 2020-10-12 VITALS — Ht 63.0 in | Wt 266.0 lb

## 2020-10-12 DIAGNOSIS — B1920 Unspecified viral hepatitis C without hepatic coma: Secondary | ICD-10-CM | POA: Insufficient documentation

## 2020-10-12 DIAGNOSIS — Z0000 Encounter for general adult medical examination without abnormal findings: Secondary | ICD-10-CM | POA: Diagnosis not present

## 2020-10-12 DIAGNOSIS — N898 Other specified noninflammatory disorders of vagina: Secondary | ICD-10-CM | POA: Insufficient documentation

## 2020-10-12 DIAGNOSIS — I82409 Acute embolism and thrombosis of unspecified deep veins of unspecified lower extremity: Secondary | ICD-10-CM | POA: Insufficient documentation

## 2020-10-12 NOTE — Progress Notes (Addendum)
Subjective:   Molly Klein is a 60 y.o. female who presents for Medicare Annual (Subsequent) preventive examination.  Virtual Visit via Telephone Note  I connected with  Molly Klein on 10/12/20 at  8:00 AM EDT by telephone and verified that I am speaking with the correct person using two identifiers.  Location: Patient: Home Provider: [facility] Persons participating in the virtual visit: patient/Nurse Health Advisor   I discussed the limitations, risks, security and privacy concerns of performing an evaluation and management service by telephone and the availability of in person appointments. The patient expressed understanding and agreed to proceed.  Interactive audio and video telecommunications were attempted between this nurse and patient, however failed, due to patient having technical difficulties OR patient did not have access to video capability.  We continued and completed visit with audio only.  Some vital signs may be absent or patient reported.   Hester Forget E Ambri Miltner, LPN   Review of Systems     Cardiac Risk Factors include: diabetes mellitus;dyslipidemia;sedentary lifestyle;obesity (BMI >30kg/m2);Other (see comment), Risk factor comments: Hepatitis, OSA     Objective:    Today's Vitals   10/12/20 0755  Weight: 266 lb (120.7 kg)  Height: 5' 3"  (1.6 m)   Body mass index is 47.12 kg/m.  Advanced Directives 10/12/2020 04/03/2020 07/20/2018 07/14/2018 04/06/2018 05/11/2016 11/12/2014  Does Patient Have a Medical Advance Directive? Yes Yes No No No No No  Type of Paramedic of [place];Living will - - - - - -  Does patient want to make changes to medical advance directive? - No - Patient declined - - - - Yes - information given  Copy of [place] in Chart? No - copy requested - - - - - -  Would patient like information on creating a medical advance directive? - - No - Patient declined Yes (MAU/Ambulatory/Procedural Areas -  Information given) Yes (MAU/Ambulatory/Procedural Areas - Information given) Yes (MAU/Ambulatory/Procedural Areas - Information given) -    Current Medications (verified) Outpatient Encounter Medications as of 10/12/2020  Medication Sig   albuterol (PROVENTIL HFA;VENTOLIN HFA) 108 (90 Base) MCG/ACT inhaler Inhale 2 puffs into the lungs 4 (four) times daily as needed. Shortness of breath (Patient taking differently: Inhale 2 puffs into the lungs 4 (four) times daily as needed for wheezing or shortness of breath.)   albuterol (PROVENTIL) (2.5 MG/3ML) 0.083% nebulizer solution INHALE 1 VIAL BY NEBULIZER AS DIRECTED (Patient taking differently: Take 2.5 mg by nebulization 4 (four) times daily as needed for wheezing or shortness of breath.)   azithromycin (ZITHROMAX Z-PAK) 250 MG tablet As directed   Blood Glucose Monitoring Suppl (ONETOUCH VERIO FLEX SYSTEM) w/Device KIT Check blood sugar up to 4 times a day. Dx Code: E11.65   glucose blood (ONETOUCH VERIO) test strip Check blood sugar up to 4 times a day. Dx Code: E11.65   hydrochlorothiazide (HYDRODIURIL) 25 MG tablet TAKE 1 TABLET(25 MG) BY MOUTH DAILY   Lancets (ONETOUCH DELICA PLUS KYHCWC37S) MISC Check blood sugar up to 4 times a day. Dx Code: E11.65   lisinopril (ZESTRIL) 5 MG tablet Take 1 tablet (5 mg total) by mouth daily.   metFORMIN (GLUCOPHAGE) 500 MG tablet TAKE 1 TABLET(500 MG) BY MOUTH TWICE DAILY WITH A MEAL   OZEMPIC, 0.25 OR 0.5 MG/DOSE, 2 MG/1.5ML SOPN 0.25 mg sq weekly   Polyethyl Glycol-Propyl Glycol (SYSTANE OP) Apply to eye.   rosuvastatin (CRESTOR) 10 MG tablet Take 1 tablet (10 mg total) by mouth  daily.   valACYclovir (VALTREX) 1000 MG tablet TAKE 1 TABLET BY MOUTH THREE TIMES DAILY FOR 7 DAYS THEN TAKE 1 TABLET BY MOUTH EVERY DAY   No facility-administered encounter medications on file as of 10/12/2020.    Allergies (verified) Codeine, Interferon beta-1a, and Sulfonamide derivatives   History: Past Medical History:   Diagnosis Date   Asthma    Bruising 10/21/2012   Cervical spinal stenosis    L4-5   DVT (deep venous thrombosis) (HCC)    GERD (gastroesophageal reflux disease)    Hepatitis C    Treated and cured per pt   History of DVT of lower extremity 10/21/2012   Hypertension    Knee pain    left    Neuromuscular disorder (HCC)    neuropathy lt arm   Nipple discharge    right breast   OSA (obstructive sleep apnea)    Past Surgical History:  Procedure Laterality Date   ABDOMINAL HYSTERECTOMY     ANTERIOR CERVICAL DECOMP/DISCECTOMY FUSION N/A 07/20/2018   Procedure: c4-5ANTERIOR CERVICAL DECOMPRESSION/DISCECTOMY FUSION, ALLOGRAFT & PLATE;  Surgeon: Marybelle Killings, MD;  Location: [facility];  Service: Orthopedics;  Laterality: N/A;   BREAST BIOPSY  02/06/2011   Procedure: BREAST BIOPSY;  Surgeon: Earnstine Regal, MD;  Location: [facility];  Service: General;  Laterality: Right;  right breast biopsy   CHOLECYSTECTOMY     DILATION AND CURETTAGE OF UTERUS     FRACTURE SURGERY     fx lt arm   Nipple repair     PILONIDAL CYST EXCISION     right knee surgery     TONSILLECTOMY     Family History  Problem Relation Age of Onset   Heart attack Mother        mi in 67s   Alcohol abuse Mother        cirrhosis of liver   Heart attack Father        MI in 37s   Rectal cancer Maternal Grandmother    Cancer Maternal Grandmother        breast   Cancer Sister        pt unaware of what kind   Social History   Socioeconomic History   Marital status: Widowed    Spouse name: Not on file   Number of children: 0   Years of education: Not on file   Highest education level: Not on file  Occupational History   Occupation: retired    Fish farm manager: DISABLED  Tobacco Use   Smoking status: Former    Years: 5.00    Types: Cigarettes    Quit date: 02/03/1975    Years since quitting: 45.7   Smokeless tobacco: Never   Tobacco comments:    1 pack per week  Vaping Use   Vaping Use: Never used   Substance and Sexual Activity   Alcohol use: No    Alcohol/week: 0.0 standard drinks   Drug use: No   Sexual activity: Yes    Partners: Male  Other Topics Concern   Not on file  Social History Narrative   No children - lives alone   Recovering alcoholic   Enjoys gambling, traveling and riding motorcycles   Sister lives nearby - very close   Social Determinants of Radio broadcast assistant Strain: Low Risk    Difficulty of Paying Living Expenses: Not hard at all  Food Insecurity: No Food Insecurity   Worried About Charity fundraiser in the  Last Year: Never true   Ran Out of Food in the Last Year: Never true  Transportation Needs: No Transportation Needs   Lack of Transportation (Medical): No   Lack of Transportation (Non-Medical): No  Physical Activity: Insufficiently Active   Days of Exercise per Week: 7 days   Minutes of Exercise per Session: 10 min  Stress: No Stress Concern Present   Feeling of Stress : Not at all  Social Connections: Socially Isolated   Frequency of Communication with Friends and Family: More than three times a week   Frequency of Social Gatherings with Friends and Family: More than three times a week   Attends Religious Services: Never   Marine scientist or Organizations: No   Attends Archivist Meetings: Never   Marital Status: Widowed    Tobacco Counseling Counseling given: Not Answered Tobacco comments: 1 pack per week   Clinical Intake:  Pre-visit preparation completed: Yes  Pain : No/denies pain     BMI - recorded: 47.12 Nutritional Status: BMI > 30  Obese Nutritional Risks: None Diabetes: Yes CBG done?: No Did pt. bring in CBG monitor from home?: No  How often do you need to have someone help you when you read instructions, pamphlets, or other written materials from your doctor or pharmacy?: 1 - Never  Diabetic? Nutrition Risk Assessment:  Has the patient had any N/V/D within the last 2 months?  No  Does  the patient have any non-healing wounds?  No  Has the patient had any unintentional weight loss or weight gain?  No   Diabetes:  Is the patient diabetic?  Yes  If diabetic, was a CBG obtained today?  No  Did the patient bring in their glucometer from home?  No  How often do you monitor your CBG's? Once daily fasting.   Financial Strains and Diabetes Management:  Are you having any financial strains with the device, your supplies or your medication? No .  Does the patient want to be seen by Chronic Care Management for management of their diabetes?  No  Would the patient like to be referred to a Nutritionist or for Diabetic Management?   She is already seeing them  Diabetic Exams:  Diabetic Eye Exam: Completed 10/2019. Overdue for diabetic eye exam. Pt has been advised about the importance in completing this exam. She has an appt with Dr Katy Fitch this month  Diabetic Foot Exam: Completed 03/28/2020. Pt has been advised about the importance in completing this exam. Pt is scheduled for diabetic foot exam on next year.    Interpreter Needed?: No  Information entered by :: Carrigan Delafuente, LPN   Activities of Daily Living In your present state of health, do you have any difficulty performing the following activities: 10/12/2020  Hearing? N  Vision? N  Difficulty concentrating or making decisions? N  Walking or climbing stairs? N  Dressing or bathing? N  Doing errands, shopping? N  Preparing Food and eating ? N  Using the Toilet? N  In the past six months, have you accidently leaked urine? Y  Comment improved  Do you have problems with loss of bowel control? N  Managing your Medications? N  Managing your Finances? N  Housekeeping or managing your Housekeeping? N  Some recent data might be hidden    Patient Care Team: Carollee Herter, Alferd Apa, DO as PCP - General Juanita Craver, MD as Consulting Physician (Gastroenterology) Warden Fillers, MD as Consulting Physician  (Ophthalmology) Carlyon Shadow,  MD as Consulting Physician (Obstetrics and Gynecology) Chesley Mires, MD as Consulting Physician (Pulmonary Disease) [facility] (Dermatology) Pixie Casino, MD as Consulting Physician (Cardiology) Marybelle Killings, MD as Consulting Physician (Orthopedic Surgery)  Indicate any recent Medical Services you may have received from other than [facility] providers in the past year (date may be approximate).     Assessment:   This is a routine wellness examination for [facility].  Hearing/Vision screen Hearing Screening - Comments:: Denies hearing difficulties  Vision Screening - Comments:: Wears glasses - up to date with annual eye exams with Dr Katy Fitch  Dietary issues and exercise activities discussed: Current Exercise Habits: The patient does not participate in regular exercise at present, Exercise limited by: orthopedic condition(s);respiratory conditions(s)   Goals Addressed             This Visit's Progress    Exercise 3x per week (30 min per time)         Depression Screen PHQ 2/9 Scores 10/12/2020 04/03/2020 04/02/2020 09/05/2015 09/02/2015 11/12/2014  PHQ - 2 Score 0 0 1 0 0 0    Fall Risk Fall Risk  10/12/2020 04/03/2020 04/02/2020 09/05/2015 11/12/2014  Falls in the past year? 0 0 0 No No  Number falls in past yr: 0 - - - -  Injury with Fall? 0 - - - -  Risk for fall due to : Impaired vision;Orthopedic patient - No Fall Risks - -  Follow up Falls prevention discussed - Falls evaluation completed - -    FALL RISK PREVENTION PERTAINING TO THE HOME:  Any stairs in or around the home? No  If so, are there any without handrails? No  Home free of loose throw rugs in walkways, pet beds, electrical cords, etc? Yes  Adequate lighting in your home to reduce risk of falls? Yes   ASSISTIVE DEVICES UTILIZED TO PREVENT FALLS:  Life alert? No  Use of a cane, walker or w/c? No  Grab bars in the bathroom? Yes  Shower chair or bench in shower? Yes   Elevated toilet seat or a handicapped toilet? Yes   TIMED UP AND GO:  Was the test performed? No . Telephonic visit  Cognitive Function: MMSE - Mini Mental State Exam 11/12/2014  Not completed: (No Data)     6CIT Screen 10/12/2020  What Year? 0 points  What month? 0 points  What time? 0 points  Count back from 20 0 points  Months in reverse 2 points  Repeat phrase 4 points  Total Score 6    Immunizations Immunization History  Administered Date(s) Administered   Influenza Split 10/22/2011   Influenza,inj,Quad PF,6+ Mos 11/02/2013   PFIZER Comirnaty(Gray Top)Covid-19 Tri-Sucrose Vaccine 03/23/2019, 05/15/2019, 11/21/2019, 05/30/2020   PNEUMOCOCCAL CONJUGATE-20 06/25/2020   Td 10/01/2003   Tdap 06/25/2020    TDAP status: Up to date  Flu Vaccine status: Due, Education has been provided regarding the importance of this vaccine. Advised may receive this vaccine at local pharmacy or Health Dept. Aware to provide a copy of the vaccination record if obtained from local pharmacy or Health Dept. Verbalized acceptance and understanding.  Pneumococcal vaccine status: Up to date  Covid-19 vaccine status: Completed vaccines  Qualifies for Shingles Vaccine? Yes   Zostavax completed No   Shingrix Completed?: No.    Education has been provided regarding the importance of this vaccine. Patient has been advised to call insurance company to determine out of pocket expense if they have not yet received this vaccine. Advised may  also receive vaccine at local pharmacy or Health Dept. Verbalized acceptance and understanding.  Screening Tests Health Maintenance  Topic Date Due   PNEUMOCOCCAL POLYSACCHARIDE VACCINE AGE 7-64 HIGH RISK  Never done   Pneumococcal Vaccine 20-40 Years old (1 - PCV) Never done   OPHTHALMOLOGY EXAM  Never done   Zoster Vaccines- Shingrix (1 of 2) Never done   COLONOSCOPY (Pts 45-8yr Insurance coverage will need to be confirmed)  03/13/2019   INFLUENZA VACCINE   09/02/2020   COVID-19 Vaccine (5 - Booster for Pfizer series) 09/29/2020   HEMOGLOBIN A1C  12/26/2020   FOOT EXAM  03/28/2021   MAMMOGRAM  03/20/2022   PAP SMEAR-Modifier  03/09/2023   TETANUS/TDAP  06/26/2030   Hepatitis C Screening  Completed   HIV Screening  Completed   HPV VACCINES  Aged Out    Health Maintenance  Health Maintenance Due  Topic Date Due   PNEUMOCOCCAL POLYSACCHARIDE VACCINE AGE 7-64 HIGH RISK  Never done   Pneumococcal Vaccine 070676Years old (1 - PCV) Never done   OPHTHALMOLOGY EXAM  Never done   Zoster Vaccines- Shingrix (1 of 2) Never done   COLONOSCOPY (Pts 45-41yrInsurance coverage will need to be confirmed)  03/13/2019   INFLUENZA VACCINE  09/02/2020   COVID-19 Vaccine (5 - Booster for PfNew [place]eries) 09/29/2020    Colorectal cancer screening: Referral to GI placed 06/2020. Pt aware the office will call re: appt. She has not received a call yet  Mammogram status: Completed 03/20/2020. Repeat every year  Bone Density due at age [age]Lung Cancer Screening: (Low Dose CT Chest recommended if Age 62-80ears, 30 pack-year currently smoking OR have quit w/in 15years.) does not qualify.   Additional Screening:  Hepatitis C Screening: does qualify; Completed 03/08/2020  Vision Screening: Recommended annual ophthalmology exams for early detection of glaucoma and other disorders of the eye. Is the patient up to date with their annual eye exam?  Yes  Who is the provider or what is the name of the office in which the patient attends annual eye exams? [facility] If pt is not established with a provider, would they like to be referred to a provider to establish care? No .   Dental Screening: Recommended annual dental exams for proper oral hygiene  Community Resource Referral / Chronic Care Management: CRR required this visit?  No   CCM required this visit?  No      Plan:     I have personally reviewed and noted the following in the patient's chart:   Medical  and social history Use of alcohol, tobacco or illicit drugs  Current medications and supplements including opioid prescriptions.  Functional ability and status Nutritional status Physical activity Advanced directives List of other physicians Hospitalizations, surgeries, and ER visits in previous 12 months Vitals Screenings to include cognitive, depression, and falls Referrals and appointments  In addition, I have reviewed and discussed with patient certain preventive protocols, quality metrics, and best practice recommendations. A written personalized care plan for preventive services as well as general preventive health recommendations were provided to patient.     AmSandrea HammondLPN   10/04/52/9826 Nurse Notes: She is still waiting on a call from GI for colonoscopy appt - was referred in May 2022

## 2020-10-12 NOTE — Patient Instructions (Signed)
Ms. Lipson , Thank you for taking time to come for your Medicare Wellness Visit. I appreciate your ongoing commitment to your health goals. Please review the following plan we discussed and let me know if I can assist you in the future.   Screening recommendations/referrals: Colonoscopy: Referral sent 06/2020 - waiting for appointment Mammogram: Done 03/20/2020 - Repeat annually Bone Density: Done 06/25/2020 - Repeat every 2 years Recommended yearly ophthalmology/optometry visit for glaucoma screening and checkup Recommended yearly dental visit for hygiene and checkup  Vaccinations: Influenza vaccine: Due. Every fall Pneumococcal vaccine: [id number] done 06/25/2020 Tdap vaccine: Done 06/25/2020 - Repeat in 10 years Shingles vaccine: Due. Shingrix discussed. Please contact your pharmacy for coverage information.    Covid-19: Done 03/23/19, 05/15/19, 11/21/19, & 05/30/2020  Advanced directives: Please bring a copy of your health care power of attorney and living will to the office to be added to your chart at your convenience.   Conditions/risks identified: Aim for 30 minutes of exercise or brisk walking each day, drink 6-8 glasses of water and eat lots of fruits and vegetables.   Next appointment: Follow up in one year for your annual wellness visit.   Preventive Care 40-64 Years, Female Preventive care refers to lifestyle choices and visits with your health care provider that can promote health and wellness. What does preventive care include? A yearly physical exam. This is also called an annual well check. Dental exams once or twice a year. Routine eye exams. Ask your health care provider how often you should have your eyes checked. Personal lifestyle choices, including: Daily care of your teeth and gums. Regular physical activity. Eating a healthy diet. Avoiding tobacco and drug use. Limiting alcohol use. Practicing safe sex. Taking low-dose aspirin daily starting at age  82. Taking vitamin and mineral supplements as recommended by your health care provider. What happens during an annual well check? The services and screenings done by your health care provider during your annual well check will depend on your age, overall health, lifestyle risk factors, and family history of disease. Counseling  Your health care provider may ask you questions about your: Alcohol use. Tobacco use. Drug use. Emotional well-being. Home and relationship well-being. Sexual activity. Eating habits. Work and work Statistician. Method of birth control. Menstrual cycle. Pregnancy history. Screening  You may have the following tests or measurements: Height, weight, and BMI. Blood pressure. Lipid and cholesterol levels. These may be checked every 5 years, or more frequently if you are over 66 years old. Skin check. Lung cancer screening. You may have this screening every year starting at age 110 if you have a 30-pack-year history of smoking and currently smoke or have quit within the past 15 years. Fecal occult blood test (FOBT) of the stool. You may have this test every year starting at age 34. Flexible sigmoidoscopy or colonoscopy. You may have a sigmoidoscopy every 5 years or a colonoscopy every 10 years starting at age 37. Hepatitis C blood test. Hepatitis B blood test. Sexually transmitted disease (STD) testing. Diabetes screening. This is done by checking your blood sugar (glucose) after you have not eaten for a while (fasting). You may have this done every 1-3 years. Mammogram. This may be done every 1-2 years. Talk to your health care provider about when you should start having regular mammograms. This may depend on whether you have a family history of breast cancer. BRCA-related cancer screening. This may be done if you have a family history of breast, ovarian, tubal, or  peritoneal cancers. Pelvic exam and Pap test. This may be done every 3 years starting at age 52.  Starting at age 34, this may be done every 5 years if you have a Pap test in combination with an HPV test. Bone density scan. This is done to screen for osteoporosis. You may have this scan if you are at high risk for osteoporosis. Discuss your test results, treatment options, and if necessary, the need for more tests with your health care provider. Vaccines  Your health care provider may recommend certain vaccines, such as: Influenza vaccine. This is recommended every year. Tetanus, diphtheria, and acellular pertussis (Tdap, Td) vaccine. You may need a Td booster every 10 years. Zoster vaccine. You may need this after age 9. Pneumococcal 13-valent conjugate (PCV13) vaccine. You may need this if you have certain conditions and were not previously vaccinated. Pneumococcal polysaccharide (PPSV23) vaccine. You may need one or two doses if you smoke cigarettes or if you have certain conditions. Talk to your health care provider about which screenings and vaccines you need and how often you need them. This information is not intended to replace advice given to you by your health care provider. Make sure you discuss any questions you have with your health care provider. Document Released: 02/15/2015 Document Revised: 10/09/2015 Document Reviewed: 11/20/2014 Elsevier Interactive Patient Education  2017 [place] Prevention in the Home Falls can cause injuries. They can happen to people of all ages. There are many things you can do to make your home safe and to help prevent falls. What can I do on the outside of my home? Regularly fix the edges of walkways and driveways and fix any cracks. Remove anything that might make you trip as you walk through a door, such as a raised step or threshold. Trim any bushes or trees on the path to your home. Use bright outdoor lighting. Clear any walking paths of anything that might make someone trip, such as rocks or tools. Regularly check to see if  handrails are loose or broken. Make sure that both sides of any steps have handrails. Any raised decks and porches should have guardrails on the edges. Have any leaves, snow, or ice cleared regularly. Use sand or salt on walking paths during winter. Clean up any spills in your garage right away. This includes oil or grease spills. What can I do in the bathroom? Use night lights. Install grab bars by the toilet and in the tub and shower. Do not use towel bars as grab bars. Use non-skid mats or decals in the tub or shower. If you need to sit down in the shower, use a plastic, non-slip stool. Keep the floor dry. Clean up any water that spills on the floor as soon as it happens. Remove soap buildup in the tub or shower regularly. Attach bath mats securely with double-sided non-slip rug tape. Do not have throw rugs and other things on the floor that can make you trip. What can I do in the bedroom? Use night lights. Make sure that you have a light by your bed that is easy to reach. Do not use any sheets or blankets that are too big for your bed. They should not hang down onto the floor. Have a firm chair that has side arms. You can use this for support while you get dressed. Do not have throw rugs and other things on the floor that can make you trip. What can I do in  the kitchen? Clean up any spills right away. Avoid walking on wet floors. Keep items that you use a lot in easy-to-reach places. If you need to reach something above you, use a strong step stool that has a grab bar. Keep electrical cords out of the way. Do not use floor polish or wax that makes floors slippery. If you must use wax, use non-skid floor wax. Do not have throw rugs and other things on the floor that can make you trip. What can I do with my stairs? Do not leave any items on the stairs. Make sure that there are handrails on both sides of the stairs and use them. Fix handrails that are broken or loose. Make sure that  handrails are as long as the stairways. Check any carpeting to make sure that it is firmly attached to the stairs. Fix any carpet that is loose or worn. Avoid having throw rugs at the top or bottom of the stairs. If you do have throw rugs, attach them to the floor with carpet tape. Make sure that you have a light switch at the top of the stairs and the bottom of the stairs. If you do not have them, ask someone to add them for you. What else can I do to help prevent falls? Wear shoes that: Do not have high heels. Have rubber bottoms. Are comfortable and fit you well. Are closed at the toe. Do not wear sandals. If you use a stepladder: Make sure that it is fully opened. Do not climb a closed stepladder. Make sure that both sides of the stepladder are locked into place. Ask someone to hold it for you, if possible. Clearly mark and make sure that you can see: Any grab bars or handrails. First and last steps. Where the edge of each step is. Use tools that help you move around (mobility aids) if they are needed. These include: Canes. Walkers. Scooters. Crutches. Turn on the lights when you go into a dark area. Replace any light bulbs as soon as they burn out. Set up your furniture so you have a clear path. Avoid moving your furniture around. If any of your floors are uneven, fix them. If there are any pets around you, be aware of where they are. Review your medicines with your doctor. Some medicines can make you feel dizzy. This can increase your chance of falling. Ask your doctor what other things that you can do to help prevent falls. This information is not intended to replace advice given to you by your health care provider. Make sure you discuss any questions you have with your health care provider. Document Released: 11/15/2008 Document Revised: 06/27/2015 Document Reviewed: 02/23/2014 Elsevier Interactive Patient Education  2017 [employer].

## 2020-10-27 ENCOUNTER — Other Ambulatory Visit: Payer: Self-pay | Admitting: Family Medicine

## 2020-10-27 DIAGNOSIS — E1165 Type 2 diabetes mellitus with hyperglycemia: Secondary | ICD-10-CM

## 2020-11-13 ENCOUNTER — Other Ambulatory Visit: Payer: Self-pay

## 2020-11-13 ENCOUNTER — Encounter: Payer: Self-pay | Admitting: Dietician

## 2020-11-13 ENCOUNTER — Encounter: Payer: Medicare HMO | Attending: Obstetrics and Gynecology | Admitting: Dietician

## 2020-11-13 DIAGNOSIS — E1165 Type 2 diabetes mellitus with hyperglycemia: Secondary | ICD-10-CM | POA: Diagnosis not present

## 2020-11-13 DIAGNOSIS — R635 Abnormal weight gain: Secondary | ICD-10-CM | POA: Insufficient documentation

## 2020-11-13 NOTE — Patient Instructions (Addendum)
Take your metformin each morning and evening with food, as prescribed.  Continue being more physically active.  Keep up the great work!!!

## 2020-11-13 NOTE — Progress Notes (Signed)
Diabetes Self-Management Education  Visit Type: Follow-up  Appt. Start Time: 1000 Appt. End Time: 6754  11/13/2020  [name], identified by name and date of birth, is a 60 y.o. female with a diagnosis of Diabetes:  .   ASSESSMENT Pt has lost 10 pounds since July. Pt reports their appetite has decreased since starting Ozempic and they have been able to limit their food intake easily. Pt has not taken their metformin in the past month, states they just forget. Pt is planning more vacations in the near future. Pt is riding their motorcycle more and being more active. Pt is still having Herbalife tea each morning. Pt is very happy with their progress.   RD checked pt BG during visit. CBG - 98 mg/dL  Height 5\' 3"  (1.6 m), weight 256 lb 3.2 oz (116.2 kg). Body mass index is 45.38 kg/m.   Diabetes Self-Management Education - 11/13/20 1000       Visit Information   Visit Type Follow-up      Dietary Intake   Breakfast Herbalife tea and fiber    Lunch 1/2 [place] and swiss sandwich    Dinner Shrimp, lobster, mussels    Beverage(s) water, tea      Exercise   Exercise Type ADL's      Subsequent Visit   Since your last visit have you continued or begun to take your medications as prescribed? Yes    Since your last visit have you had your blood pressure checked? No    Since your last visit have you experienced any weight changes? Loss    Weight Loss (lbs) 10    Since your last visit, are you checking your blood glucose at least once a day? No             Individualized Plan for Diabetes Self-Management Training:   Learning Objective:  Patient will have a greater understanding of diabetes self-management. Patient education plan is to attend individual and/or group sessions per assessed needs and concerns.   Plan:   Patient Instructions  Take your metformin each morning and evening with food, as prescribed.  Continue being more physically active.  Keep up the  great work!!!   Expected Outcomes: Expect positive outcomes  If problems or questions, patient to contact team via:  Phone and Email  Future DSME appointment:  3 months

## 2021-01-06 ENCOUNTER — Other Ambulatory Visit (HOSPITAL_BASED_OUTPATIENT_CLINIC_OR_DEPARTMENT_OTHER): Payer: Self-pay

## 2021-01-06 ENCOUNTER — Ambulatory Visit: Payer: Medicare HMO | Attending: Internal Medicine

## 2021-01-06 ENCOUNTER — Ambulatory Visit (INDEPENDENT_AMBULATORY_CARE_PROVIDER_SITE_OTHER): Payer: Medicare HMO | Admitting: Family Medicine

## 2021-01-06 ENCOUNTER — Encounter: Payer: Self-pay | Admitting: Family Medicine

## 2021-01-06 VITALS — BP 126/70 | HR 81 | Temp 97.8°F | Resp 18 | Ht 63.0 in | Wt 257.6 lb

## 2021-01-06 DIAGNOSIS — Z0000 Encounter for general adult medical examination without abnormal findings: Secondary | ICD-10-CM

## 2021-01-06 DIAGNOSIS — E1165 Type 2 diabetes mellitus with hyperglycemia: Secondary | ICD-10-CM

## 2021-01-06 DIAGNOSIS — E785 Hyperlipidemia, unspecified: Secondary | ICD-10-CM

## 2021-01-06 DIAGNOSIS — Z23 Encounter for immunization: Secondary | ICD-10-CM

## 2021-01-06 DIAGNOSIS — E1169 Type 2 diabetes mellitus with other specified complication: Secondary | ICD-10-CM

## 2021-01-06 LAB — COMPREHENSIVE METABOLIC PANEL
ALT: 37 U/L — ABNORMAL HIGH (ref 0–35)
AST: 21 U/L (ref 0–37)
Albumin: 4.1 g/dL (ref 3.5–5.2)
Alkaline Phosphatase: 52 U/L (ref 39–117)
BUN: 17 mg/dL (ref 6–23)
CO2: 29 mEq/L (ref 19–32)
Calcium: 8.8 mg/dL (ref 8.4–10.5)
Chloride: 104 mEq/L (ref 96–112)
Creatinine, Ser: 0.73 mg/dL (ref 0.40–1.20)
GFR: 88 mL/min (ref >60.00)
Glucose, Bld: 141 mg/dL — ABNORMAL HIGH (ref 70–99)
Potassium: 3.7 mEq/L (ref 3.5–5.1)
Sodium: 139 mEq/L (ref 135–145)
Total Bilirubin: 0.4 mg/dL (ref 0.2–1.2)
Total Protein: 6.9 g/dL (ref 6.0–8.3)

## 2021-01-06 LAB — CBC WITH DIFFERENTIAL/PLATELET
Basophils Absolute: 0 10*3/uL (ref 0.0–0.1)
Basophils Relative: 0.9 % (ref 0.0–3.0)
Eosinophils Absolute: 0.1 10*3/uL (ref 0.0–0.7)
Eosinophils Relative: 3.4 % (ref 0.0–5.0)
HCT: 39.9 % (ref 36.0–46.0)
Hemoglobin: 13.5 g/dL (ref 12.0–15.0)
Lymphocytes Relative: 35 % (ref 12.0–46.0)
Lymphs Abs: 1.2 10*3/uL (ref 0.7–4.0)
MCHC: 33.9 g/dL (ref 30.0–36.0)
MCV: 88 fl (ref 78.0–100.0)
Monocytes Absolute: 0.2 10*3/uL (ref 0.1–1.0)
Monocytes Relative: 5.2 % (ref 3.0–12.0)
Neutro Abs: 1.9 10*3/uL (ref 1.4–7.7)
Neutrophils Relative %: 55.5 % (ref 43.0–77.0)
Platelets: 205 10*3/uL (ref 150.0–400.0)
RBC: 4.53 Mil/uL (ref 3.87–5.11)
RDW: 15 % (ref 11.5–15.5)
WBC: 3.4 10*3/uL — ABNORMAL LOW (ref 4.0–10.5)

## 2021-01-06 LAB — LIPID PANEL
Cholesterol: 155 mg/dL (ref 0–200)
HDL: 44.5 mg/dL (ref >39.00)
LDL Cholesterol: 91 mg/dL (ref 0–99)
NonHDL: 110.69
Total CHOL/HDL Ratio: 3
Triglycerides: 96 mg/dL (ref 0.0–149.0)
VLDL: 19.2 mg/dL (ref 0.0–40.0)

## 2021-01-06 LAB — HEMOGLOBIN A1C: Hgb A1c MFr Bld: 6.1 % (ref 4.6–6.5)

## 2021-01-06 MED ORDER — SEMAGLUTIDE (1 MG/DOSE) 4 MG/3ML ~~LOC~~ SOPN: 1.0000 mg | PEN_INJECTOR | SUBCUTANEOUS | 3 refills | Status: DC

## 2021-01-06 MED ORDER — PFIZER COVID-19 VAC BIVALENT 30 MCG/0.3ML IM SUSP
INTRAMUSCULAR | 0 refills | Status: DC
  Filled 2021-01-06: qty 0.3, 1d supply, fill #0

## 2021-01-06 NOTE — Progress Notes (Signed)
Subjective:     Molly Klein is a 60 y.o. female and is here for a comprehensive physical exam. The patient reports no problems.   HPI HYPERTENSION  Blood pressure range-not checking   Chest pain- no      Dyspnea- no Lightheadedness- no   Edema- no Other side effects - no   Medication compliance: good Low salt diet- yes  DIABETES  Blood Sugar ranges-good per pt   Polyuria- no New Visual problems- no Hypoglycemic symptoms- no Other side effects-no Medication compliance - good Last eye exam- due Foot exam- today  HYPERLIPIDEMIA  Medication compliance- good RUQ pain- no  Muscle aches- no Other side effects-no    Social History   Socioeconomic History   Marital status: Widowed    Spouse name: Not on file   Number of children: 0   Years of education: Not on file   Highest education level: Not on file  Occupational History   Occupation: retired    Fish farm manager: DISABLED  Tobacco Use   Smoking status: Former    Years: 5.00    Types: Cigarettes    Quit date: 02/03/1975    Years since quitting: 45.9   Smokeless tobacco: Never   Tobacco comments:    1 pack per week  Vaping Use   Vaping Use: Never used  Substance and Sexual Activity   Alcohol use: No    Alcohol/week: 0.0 standard drinks   Drug use: No   Sexual activity: Yes    Partners: Male  Other Topics Concern   Not on file  Social History Narrative   No children - lives alone   Recovering alcoholic   Enjoys gambling, traveling and riding motorcycles   Sister lives nearby - very close   Social Determinants of Radio broadcast assistant Strain: Low Risk    Difficulty of Paying Living Expenses: Not hard at all  Food Insecurity: No Food Insecurity   Worried About Charity fundraiser in the Last Year: Never true   Arboriculturist in the Last Year: Never true  Transportation Needs: No Transportation Needs   Lack of Transportation (Medical): No   Lack of Transportation (Non-Medical): No  Physical  Activity: Insufficiently Active   Days of Exercise per Week: 7 days   Minutes of Exercise per Session: 10 min  Stress: No Stress Concern Present   Feeling of Stress : Not at all  Social Connections: Socially Isolated   Frequency of Communication with Friends and Family: More than three times a week   Frequency of Social Gatherings with Friends and Family: More than three times a week   Attends Religious Services: Never   Marine scientist or Organizations: No   Attends Archivist Meetings: Never   Marital Status: Widowed  Human resources officer Violence: Not At Risk   Fear of Current or Ex-Partner: No   Emotionally Abused: No   Physically Abused: No   Sexually Abused: No   Health Maintenance  Topic Date Due   OPHTHALMOLOGY EXAM  Never done   Zoster Vaccines- Shingrix (1 of 2) Never done   COLONOSCOPY (Pts 45-47yr Insurance coverage will need to be confirmed)  03/13/2019   INFLUENZA VACCINE  09/02/2020   HEMOGLOBIN A1C  12/26/2020   FOOT EXAM  01/06/2022   MAMMOGRAM  03/20/2022   PAP SMEAR-Modifier  03/09/2023   TETANUS/TDAP  06/26/2030   Pneumococcal Vaccine 140646Years old  Completed   COVID-19 Vaccine  Completed  Hepatitis C Screening  Completed   HIV Screening  Completed   HPV VACCINES  Aged Out    The following portions of the patient's history were reviewed and updated as appropriate: She  has a past medical history of Asthma, Bruising (10/21/2012), Cervical spinal stenosis, DVT (deep venous thrombosis) ([place]), GERD (gastroesophageal reflux disease), Hepatitis C, History of DVT of lower extremity (10/21/2012), Hypertension, Knee pain, Neuromuscular disorder ([place]), Nipple discharge, and OSA (obstructive sleep apnea). She does not have any pertinent problems on file. She  has a past surgical history that includes Cholecystectomy; Pilonidal cyst excision; Dilation and curettage of uterus; Tonsillectomy; Abdominal hysterectomy; Fracture surgery; Breast biopsy  (02/06/2011); right knee surgery; Nipple repair; and Anterior cervical decomp/discectomy fusion (N/A, 07/20/2018). Her family history includes Alcohol abuse in her mother; Cancer in her maternal grandmother and sister; Heart attack in her father and mother; Rectal cancer in her maternal grandmother. She  reports that she quit smoking about 45 years ago. Her smoking use included cigarettes. She has never used smokeless tobacco. She reports that she does not drink alcohol and does not use drugs. She has a current medication list which includes the following prescription(s): albuterol, albuterol, onetouch verio flex system, onetouch verio, hydrochlorothiazide, onetouch delica plus [id number], lisinopril, metformin, polyethyl glycol-propyl glycol, rosuvastatin, semaglutide (1 mg/dose), and valacyclovir. Current Outpatient Medications on File Prior to Visit  Medication Sig Dispense Refill   albuterol (PROVENTIL HFA;VENTOLIN HFA) 108 (90 Base) MCG/ACT inhaler Inhale 2 puffs into the lungs 4 (four) times daily as needed. Shortness of breath (Patient taking differently: Inhale 2 puffs into the lungs 4 (four) times daily as needed for wheezing or shortness of breath.) 1 Inhaler 6   albuterol (PROVENTIL) (2.5 MG/3ML) 0.083% nebulizer solution INHALE 1 VIAL BY NEBULIZER AS DIRECTED (Patient taking differently: Take 2.5 mg by nebulization 4 (four) times daily as needed for wheezing or shortness of breath.) 75 mL 0   Blood Glucose Monitoring Suppl (ONETOUCH VERIO FLEX SYSTEM) w/Device KIT Check blood sugar up to 4 times a day. Dx Code: E11.65 1 kit 0   glucose blood (ONETOUCH VERIO) test strip Check blood sugar up to 4 times a day. Dx Code: E11.65 400 each 1   hydrochlorothiazide (HYDRODIURIL) 25 MG tablet TAKE 1 TABLET(25 MG) BY MOUTH DAILY 90 tablet 1   Lancets (ONETOUCH DELICA PLUS [id number]) MISC Check blood sugar up to 4 times a day. Dx Code: E11.65 400 each 1   lisinopril (ZESTRIL) 5 MG tablet Take 1 tablet (5 mg  total) by mouth daily. 90 tablet 3   metFORMIN (GLUCOPHAGE) 500 MG tablet TAKE 1 TABLET(500 MG) BY MOUTH TWICE DAILY WITH A MEAL 90 tablet 1   Polyethyl Glycol-Propyl Glycol (SYSTANE OP) Apply to eye.     rosuvastatin (CRESTOR) 10 MG tablet Take 1 tablet (10 mg total) by mouth daily. 90 tablet 3   valACYclovir (VALTREX) 1000 MG tablet TAKE 1 TABLET BY MOUTH THREE TIMES DAILY FOR 7 DAYS THEN TAKE 1 TABLET BY MOUTH EVERY DAY 42 tablet 5   No current facility-administered medications on file prior to visit.   She is allergic to codeine, interferon beta-1a, and sulfonamide derivatives..  Review of Systems Review of Systems  Constitutional: Negative for activity change, appetite change and fatigue.  HENT: Negative for hearing loss, congestion, tinnitus and ear discharge.  dentist q81mEyes: Negative for visual disturbance (see optho q1y -- vision corrected to 20/20 with glasses).  Respiratory: Negative for cough, chest tightness and shortness of breath.  Cardiovascular: Negative for chest pain, palpitations and leg swelling.  Gastrointestinal: Negative for abdominal pain, diarrhea, constipation and abdominal distention.  Genitourinary: Negative for urgency, frequency, decreased urine volume and difficulty urinating.  Musculoskeletal: Negative for back pain, arthralgias and gait problem.  Skin: Negative for color change, pallor and rash.  Neurological: Negative for dizziness, light-headedness, numbness and headaches.  Hematological: Negative for adenopathy. Does not bruise/bleed easily.  Psychiatric/Behavioral: Negative for suicidal ideas, confusion, sleep disturbance, self-injury, dysphoric mood, decreased concentration and agitation.      Objective:    BP 126/70 (BP Location: Left Arm, Patient Position: Sitting, Cuff Size: Large)   Pulse 81   Temp 97.8 F (36.6 C) (Oral)   Resp 18   Ht 5' 3" (1.6 m)   Wt 257 lb 9.6 oz (116.8 kg)   SpO2 98%   BMI 45.63 kg/m  General appearance:  alert, cooperative, and no distress Head: Normocephalic, without obvious abnormality, atraumatic Eyes: negative findings: lids and lashes normal, conjunctivae and sclerae normal, and pupils equal, round, reactive to light and accomodation Ears: normal TM's and external ear canals both ears Nose: Nares normal. Septum midline. Mucosa normal. No drainage or sinus tenderness. Throat: lips, mucosa, and tongue normal; teeth and gums normal Neck: no adenopathy, no carotid bruit, no JVD, supple, symmetrical, trachea midline, and thyroid not enlarged, symmetric, no tenderness/mass/nodules Back: symmetric, no curvature. ROM normal. No CVA tenderness. Lungs: clear to auscultation bilaterally Heart: regular rate and rhythm, S1, S2 normal, no murmur, click, rub or gallop Abdomen: soft, non-tender; bowel sounds normal; no masses,  no organomegaly Extremities: extremities normal, atraumatic, no cyanosis or edema Pulses: 2+ and symmetric Skin: Skin color, texture, turgor normal. No rashes or lesions Lymph nodes: Cervical, supraclavicular, and axillary nodes normal. Neurologic: Alert and oriented X 3, normal strength and tone. Normal symmetric reflexes. Normal coordination and gait   . Diabetic Foot Exam - Simple   Simple Foot Form Diabetic Foot exam was performed with the following findings: Yes 01/06/2021 12:56 PM  Visual Inspection No deformities, no ulcerations, no other skin breakdown bilaterally: Yes Sensation Testing Intact to touch and monofilament testing bilaterally: Yes Pulse Check Posterior Tibialis and Dorsalis pulse intact bilaterally: Yes Comments     Assessment:    Healthy female exam.      Plan:    Ghm utd Check labs  See After Visit Summary for Counseling Recommendations   1. Type 2 diabetes mellitus with hyperglycemia, without long-term current use of insulin ([place]) Inc ozempic to 1 mg weekly  - Semaglutide, 1 MG/DOSE, 4 MG/3ML SOPN; Inject 1 mg as directed once a week.   Dispense: 3 mL; Refill: 3 - CBC with Differential/Platelet - Comprehensive metabolic panel - Hemoglobin A1c - Lipid panel  2. Preventative health care Ghm utd Check labs   3. Hyperlipidemia associated with type 2 diabetes mellitus ([place]) Tolerating statin, encouraged heart healthy diet, avoid trans fats, minimize simple carbs and saturated fats. Increase exercise as tolerated  - CBC with Differential/Platelet - Comprehensive metabolic panel - Hemoglobin A1c - Lipid panel

## 2021-01-06 NOTE — Progress Notes (Signed)
   Covid-19 Vaccination Clinic  Name:  Molly Klein    MRN: [id number] DOB: 03-Jun-1960  01/06/2021  [name] was observed post Covid-19 immunization for 15 minutes without incident. She was provided with Vaccine Information Sheet and instruction to access the V-Safe system.   [name] was instructed to call 911 with any severe reactions post vaccine: Difficulty breathing  Swelling of face and throat  A fast heartbeat  A bad rash all over body  Dizziness and weakness   Immunizations Administered     Name Date Dose VIS Date Route   Pfizer Covid-19 Vaccine Bivalent Booster 01/06/2021 11:25 AM 0.3 mL 10/02/2020 Intramuscular   Manufacturer: [place]   Lot: [id number]   [place]: [phone]

## 2021-01-06 NOTE — Patient Instructions (Signed)
Preventive Care 92-62 Years Old, Female Preventive care refers to lifestyle choices and visits with your health care provider that can promote health and wellness. Preventive care visits are also called wellness exams. What can I expect for my preventive care visit? Counseling Your health care provider may ask you questions about your: Medical history, including: Past medical problems. Family medical history. Pregnancy history. Current health, including: Menstrual cycle. Method of birth control. Emotional well-being. Home life and relationship well-being. Sexual activity and sexual health. Lifestyle, including: Alcohol, nicotine or tobacco, and drug use. Access to firearms. Diet, exercise, and sleep habits. Work and work Statistician. Sunscreen use. Safety issues such as seatbelt and bike helmet use. Physical exam Your health care provider will check your: Height and weight. These may be used to calculate your BMI (body mass index). BMI is a measurement that tells if you are at a healthy weight. Waist circumference. This measures the distance around your waistline. This measurement also tells if you are at a healthy weight and may help predict your risk of certain diseases, such as type 2 diabetes and high blood pressure. Heart rate and blood pressure. Body temperature. Skin for abnormal spots. What immunizations do I need? Vaccines are usually given at various ages, according to a schedule. Your health care provider will recommend vaccines for you based on your age, medical history, and lifestyle or other factors, such as travel or where you work. What tests do I need? Screening Your health care provider may recommend screening tests for certain conditions. This may include: Lipid and cholesterol levels. Diabetes screening. This is done by checking your blood sugar (glucose) after you have not eaten for a while (fasting). Pelvic exam and Pap test. Hepatitis B test. Hepatitis C  test. HIV (human immunodeficiency virus) test. STI (sexually transmitted infection) testing, if you are at risk. Lung cancer screening. Colorectal cancer screening. Mammogram. Talk with your health care provider about when you should start having regular mammograms. This may depend on whether you have a family history of breast cancer. BRCA-related cancer screening. This may be done if you have a family history of breast, ovarian, tubal, or peritoneal cancers. Bone density scan. This is done to screen for osteoporosis. Talk with your health care provider about your test results, treatment options, and if necessary, the need for more tests. Follow these instructions at home: Eating and drinking  Eat a diet that includes fresh fruits and vegetables, whole grains, lean protein, and low-fat dairy products. Take vitamin and mineral supplements as recommended by your health care provider. Do not drink alcohol if: Your health care provider tells you not to drink. You are pregnant, may be pregnant, or are planning to become pregnant. If you drink alcohol: Limit how much you have to 0-1 drink a day. Know how much alcohol is in your drink. In the [place] one drink equals one 12 oz bottle of beer (355 mL), one 5 oz glass of wine (148 mL), or one 1 oz glass of hard liquor (44 mL). Lifestyle Brush your teeth every morning and night with fluoride toothpaste. Floss one time each day. Exercise for at least 30 minutes 5 or more days each week. Do not use any products that contain nicotine or tobacco. These products include cigarettes, chewing tobacco, and vaping devices, such as e-cigarettes. If you need help quitting, ask your health care provider. Do not use drugs. If you are sexually active, practice safe sex. Use a condom or other form of protection to prevent  STIs. If you do not wish to become pregnant, use a form of birth control. If you plan to become pregnant, see your health care provider for a  prepregnancy visit. Take aspirin only as told by your health care provider. Make sure that you understand how much to take and what form to take. Work with your health care provider to find out whether it is safe and beneficial for you to take aspirin daily. Find healthy ways to manage stress, such as: Meditation, yoga, or listening to music. Journaling. Talking to a trusted person. Spending time with friends and family. Minimize exposure to UV radiation to reduce your risk of skin cancer. Safety Always wear your seat belt while driving or riding in a vehicle. Do not drive: If you have been drinking alcohol. Do not ride with someone who has been drinking. When you are tired or distracted. While texting. If you have been using any mind-altering substances or drugs. Wear a helmet and other protective equipment during sports activities. If you have firearms in your house, make sure you follow all gun safety procedures. Seek help if you have been physically or sexually abused. What's next? Visit your health care provider once a year for an annual wellness visit. Ask your health care provider how often you should have your eyes and teeth checked. Stay up to date on all vaccines. This information is not intended to replace advice given to you by your health care provider. Make sure you discuss any questions you have with your health care provider. Document Revised: 07/17/2020 Document Reviewed: 07/17/2020 Elsevier Patient Education  [employer].

## 2021-02-04 ENCOUNTER — Other Ambulatory Visit: Payer: Self-pay

## 2021-02-04 ENCOUNTER — Encounter: Payer: Self-pay | Admitting: Dietician

## 2021-02-04 ENCOUNTER — Encounter: Payer: Medicare HMO | Attending: Obstetrics and Gynecology | Admitting: Dietician

## 2021-02-04 DIAGNOSIS — E1165 Type 2 diabetes mellitus with hyperglycemia: Secondary | ICD-10-CM | POA: Insufficient documentation

## 2021-02-04 NOTE — Progress Notes (Signed)
Diabetes Self-Management Education  Visit Type: Follow-up  Appt. Start Time: 1020 Appt. End Time: 1055  02/04/2021  [name], identified by name and date of birth, is a 61 y.o. female with a diagnosis of Diabetes:  .   ASSESSMENT Pt A1c is down to 6.1 from 8.1. Pt is now taking 1mg  per week of Ozempic. Pt reports getting full quickly still, not eating as much. Pt reports being more conscious of their food decisions, eating more salads. Pt is still not eating consistently throughout the day. Pt is very happy with their progress.   Height 5\' 3"  (1.6 m), weight 258 lb 12.8 oz (117.4 kg). Body mass index is 45.84 kg/m.   Diabetes Self-Management Education - 02/04/21 1000       Visit Information   Visit Type Follow-up      Complications   Last HgB A1C per patient/outside source 6.1 %   01/06/2021   How often do you check your blood sugar? 0 times/day (not testing)      Dietary Intake   Breakfast Herbalife tea    Dinner 2 slices of pizza, bag of chips      Exercise   Exercise Type ADL's      Individualized Goals (developed by patient)   Medications take my medication as prescribed      Patient Self-Evaluation of Goals - Patient rates self as meeting previously set goals (% of time)   Nutrition 25 - 50%    Physical Activity < 25%    Medications 50 - 75 %    Monitoring < 25%    Problem Solving 25 - 50%    Reducing Risk 50 - 75 %    Health Coping 50 - 75 %      Post-Education Assessment   Patient understands the diabetes disease and treatment process. Needs Review    Patient understands incorporating nutritional management into lifestyle. Needs Review    Patient undertands incorporating physical activity into lifestyle. Needs Review    Patient understands using medications safely. Needs Review    Patient understands monitoring blood glucose, interpreting and using results Needs Review    Patient understands prevention, detection, and treatment of acute  complications. Needs Review    Patient understands prevention, detection, and treatment of chronic complications. Needs Review    Patient understands how to develop strategies to address psychosocial issues. Needs Review    Patient understands how to develop strategies to promote health/change behavior. Needs Review      Outcomes   Expected Outcomes Demonstrated interest in learning. Expect positive outcomes    Future DMSE 3-4 months    Program Status Not Completed      Subsequent Visit   Since your last visit have you had your blood pressure checked? Yes    Is your most recent blood pressure lower, unchanged, or higher since your last visit? Lower    Since your last visit have you experienced any weight changes? Gain    Weight Loss (lbs) 6    Since your last visit, are you checking your blood glucose at least once a day? No             Individualized Plan for Diabetes Self-Management Training:   Learning Objective:  Patient will have a greater understanding of diabetes self-management. Patient education plan is to attend individual and/or group sessions per assessed needs and concerns.   Plan:   Patient Instructions  Work on having a healthy snack every few hours. Try  some fruit (strawberries, pineapple) with Cool Whip, a small handful of cashews or pistachios, or Skinny Pop popcorn.  Keep up the great work!! Your A1c continues to improve!  Work on increasing your steps gradually, you're doing very well!  Take your metformin as prescribed.   Expected Outcomes:  Demonstrated interest in learning. Expect positive outcomes  If problems or questions, patient to contact team via:  Phone and Email  Future DSME appointment: 3-4 months

## 2021-02-04 NOTE — Patient Instructions (Addendum)
Work on having a healthy snack every few hours. Try some fruit (strawberries, pineapple) with Cool Whip, a small handful of cashews or pistachios, or Skinny Pop popcorn.  Keep up the great work!! Your A1c continues to improve!  Work on increasing your steps gradually, you're doing very well!  Take your metformin as prescribed.

## 2021-03-28 ENCOUNTER — Telehealth: Payer: Self-pay | Admitting: Family Medicine

## 2021-03-28 DIAGNOSIS — Z1211 Encounter for screening for malignant neoplasm of colon: Secondary | ICD-10-CM

## 2021-03-28 DIAGNOSIS — E785 Hyperlipidemia, unspecified: Secondary | ICD-10-CM

## 2021-03-28 DIAGNOSIS — Z1389 Encounter for screening for other disorder: Secondary | ICD-10-CM

## 2021-03-28 DIAGNOSIS — E1169 Type 2 diabetes mellitus with other specified complication: Secondary | ICD-10-CM

## 2021-03-28 NOTE — Telephone Encounter (Signed)
Hassan Rowan called from [facility] regarding vo for pt.   Pt is requesting home test kit for colon cancer screening.   Kidney screening which includes blood and urine test   Please advise.  [phone]

## 2021-04-01 NOTE — Addendum Note (Signed)
Addended by: Sanda Linger on: 04/01/2021 11:48 AM   Modules accepted: Orders

## 2021-04-01 NOTE — Telephone Encounter (Signed)
Orders placed. No fax number was provided and attempted to call but had a long wait time

## 2021-04-02 ENCOUNTER — Other Ambulatory Visit: Payer: Self-pay | Admitting: Family Medicine

## 2021-04-02 DIAGNOSIS — E1169 Type 2 diabetes mellitus with other specified complication: Secondary | ICD-10-CM

## 2021-04-02 DIAGNOSIS — E785 Hyperlipidemia, unspecified: Secondary | ICD-10-CM

## 2021-05-07 ENCOUNTER — Encounter: Payer: Medicare HMO | Admitting: Dietician

## 2021-05-15 ENCOUNTER — Other Ambulatory Visit: Payer: Self-pay | Admitting: Family Medicine

## 2021-05-15 DIAGNOSIS — E1165 Type 2 diabetes mellitus with hyperglycemia: Secondary | ICD-10-CM

## 2021-06-25 ENCOUNTER — Ambulatory Visit: Payer: Medicare HMO | Admitting: Dietician

## 2021-07-03 ENCOUNTER — Encounter: Payer: Self-pay | Admitting: *Deleted

## 2021-07-07 ENCOUNTER — Ambulatory Visit: Payer: Medicare HMO | Admitting: Family Medicine

## 2021-07-10 ENCOUNTER — Ambulatory Visit: Payer: Medicare HMO | Admitting: Family Medicine

## 2021-07-10 ENCOUNTER — Encounter: Payer: Self-pay | Admitting: Family Medicine

## 2021-07-10 VITALS — BP 124/70 | HR 77 | Temp 97.7°F | Resp 18 | Ht 63.0 in | Wt 262.4 lb

## 2021-07-10 DIAGNOSIS — E1169 Type 2 diabetes mellitus with other specified complication: Secondary | ICD-10-CM

## 2021-07-10 DIAGNOSIS — E1165 Type 2 diabetes mellitus with hyperglycemia: Secondary | ICD-10-CM

## 2021-07-10 DIAGNOSIS — I10 Essential (primary) hypertension: Secondary | ICD-10-CM | POA: Diagnosis not present

## 2021-07-10 DIAGNOSIS — E785 Hyperlipidemia, unspecified: Secondary | ICD-10-CM

## 2021-07-10 DIAGNOSIS — Z1211 Encounter for screening for malignant neoplasm of colon: Secondary | ICD-10-CM

## 2021-07-10 LAB — LIPID PANEL
Cholesterol: 149 mg/dL (ref 0–200)
HDL: 41.8 mg/dL (ref >39.00)
LDL Cholesterol: 93 mg/dL (ref 0–99)
NonHDL: 107.63
Total CHOL/HDL Ratio: 4
Triglycerides: 72 mg/dL (ref 0.0–149.0)
VLDL: 14.4 mg/dL (ref 0.0–40.0)

## 2021-07-10 LAB — COMPREHENSIVE METABOLIC PANEL
ALT: 26 U/L (ref 0–35)
AST: 15 U/L (ref 0–37)
Albumin: 4 g/dL (ref 3.5–5.2)
Alkaline Phosphatase: 58 U/L (ref 39–117)
BUN: 11 mg/dL (ref 6–23)
CO2: 30 mEq/L (ref 19–32)
Calcium: 9 mg/dL (ref 8.4–10.5)
Chloride: 102 mEq/L (ref 96–112)
Creatinine, Ser: 0.77 mg/dL (ref 0.40–1.20)
GFR: 82.25 mL/min (ref >60.00)
Glucose, Bld: 118 mg/dL — ABNORMAL HIGH (ref 70–99)
Potassium: 3.9 mEq/L (ref 3.5–5.1)
Sodium: 140 mEq/L (ref 135–145)
Total Bilirubin: 0.6 mg/dL (ref 0.2–1.2)
Total Protein: 6.6 g/dL (ref 6.0–8.3)

## 2021-07-10 LAB — MICROALBUMIN / CREATININE URINE RATIO
Creatinine,U: 55.6 mg/dL
Microalb Creat Ratio: 1.3 mg/g (ref 0.0–30.0)
Microalb, Ur: <0.7 mg/dL (ref 0.0–1.9)

## 2021-07-10 LAB — HEMOGLOBIN A1C: Hgb A1c MFr Bld: 5.9 % (ref 4.6–6.5)

## 2021-07-10 MED ORDER — SEMAGLUTIDE (2 MG/DOSE) 8 MG/3ML ~~LOC~~ SOPN: 2.0000 mg | PEN_INJECTOR | SUBCUTANEOUS | 3 refills | Status: DC

## 2021-07-10 NOTE — Patient Instructions (Signed)

## 2021-07-10 NOTE — Progress Notes (Signed)
Subjective:   By signing my name below, I, Molly Klein, attest that this documentation has been prepared under the direction and in the presence of [name], DO  07/10/2021    Patient ID: Molly Klein, female    DOB: 07-13-1960, 61 y.o.   MRN: [id number]  Chief Complaint  Patient presents with   Hypertension   Hyperlipidemia   Diabetes   Follow-up    Hypertension Pertinent negatives include no blurred vision, chest pain, headaches, malaise/fatigue, palpitations or shortness of breath.  Hyperlipidemia Pertinent negatives include no chest pain or shortness of breath.  Diabetes Pertinent negatives for hypoglycemia include no dizziness, headaches or nervousness/anxiousness. Pertinent negatives for diabetes include no blurred vision and no chest pain.   Patient is in today for a office visit.   She continues using ozempic and reports doing well while taking it. She is currently on 47m and has one more dose. She currently weighs 262 lb's and has a BMI of 46.48 kg/m^2. She has lost 20 lb's since she started.  Wt Readings from Last 3 Encounters:  07/10/21 262 lb 6.4 oz (119 kg)  02/04/21 258 lb 12.8 oz (117.4 kg)  01/06/21 257 lb 9.6 oz (116.8 kg)   She has not completed her cologaurd. She has a kit at home but was unsure of the directions. She reports the kit is older.  She has seen her eye doctor this year.  She is eligible for the shingles vaccine and is willing to receive it at her pharmacy if she decides to get it.    Past Medical History:  Diagnosis Date   Asthma    Bruising 10/21/2012   Cervical spinal stenosis    L4-5   DVT (deep venous thrombosis) (HCC)    GERD (gastroesophageal reflux disease)    Hepatitis C    Treated and cured per pt   History of DVT of lower extremity 10/21/2012   Hypertension    Knee pain    left    Neuromuscular disorder (HCC)    neuropathy lt arm   Nipple discharge    right breast   OSA (obstructive sleep apnea)      Past Surgical History:  Procedure Laterality Date   ABDOMINAL HYSTERECTOMY     ANTERIOR CERVICAL DECOMP/DISCECTOMY FUSION N/A 07/20/2018   Procedure: c4-5ANTERIOR CERVICAL DECOMPRESSION/DISCECTOMY FUSION, ALLOGRAFT & PLATE;  Surgeon: YMarybelle Killings MD;  [place] [facility]  Service: Orthopedics;  Laterality: N/A;   BREAST BIOPSY  02/06/2011   Procedure: BREAST BIOPSY;  Surgeon: TEarnstine Regal MD;  [facility]  Service: General;  Laterality: Right;  right breast biopsy   CHOLECYSTECTOMY     DILATION AND CURETTAGE OF UTERUS     FRACTURE SURGERY     fx lt arm   Nipple repair     PILONIDAL CYST EXCISION     right knee surgery     TONSILLECTOMY      Family History  Problem Relation Age of Onset   Heart attack Mother        mi in 660s  Alcohol abuse Mother        cirrhosis of liver   Heart attack Father        MI in 632s  Rectal cancer Maternal Grandmother    Cancer Maternal Grandmother        breast   Cancer Sister        pt unaware of what kind  Social History   Socioeconomic History   Marital status: Widowed    Spouse name: Not on file   Number of children: 0   Years of education: Not on file   Highest education level: Not on file  Occupational History   Occupation: retired    Fish farm manager: DISABLED  Tobacco Use   Smoking status: Former    Years: 5.00    Types: Cigarettes    Quit date: 02/03/1975    Years since quitting: 46.4   Smokeless tobacco: Never   Tobacco comments:    1 pack per week  Vaping Use   Vaping Use: Never used  Substance and Sexual Activity   Alcohol use: No    Alcohol/week: 0.0 standard drinks of alcohol   Drug use: No   Sexual activity: Yes    Partners: Male  Other Topics Concern   Not on file  Social History Narrative   No children - lives alone   Recovering alcoholic   Enjoys gambling, traveling and riding motorcycles   Sister lives nearby - very close   Social Determinants of Health   Financial Resource  Strain: [place]  (10/12/2020)   Overall Financial Resource Strain (CARDIA)    Difficulty of Paying Living Expenses: Not hard at all  Food Insecurity: No [place] (10/12/2020)   Hunger Vital Sign    Worried About Running Out of Food in the Last Year: Never true    [place] in the Last Year: Never true  Transportation Needs: No Transportation Needs (10/12/2020)   PRAPARE - Hydrologist (Medical): No    Lack of Transportation (Non-Medical): No  Physical Activity: Insufficiently Active (10/12/2020)   Exercise Vital Sign    Days of Exercise per Week: 7 days    Minutes of Exercise per Session: 10 min  Stress: No Stress Concern Present (10/12/2020)   [place]    Feeling of Stress : Not at all  Social Connections: Socially Isolated (10/12/2020)   Social Connection and Isolation Panel [NHANES]    Frequency of Communication with Friends and Family: More than three times a week    Frequency of Social Gatherings with Friends and Family: More than three times a week    Attends Religious Services: Never    Marine scientist or Organizations: No    Attends Archivist Meetings: Never    Marital Status: Widowed  Intimate Partner Violence: Not At Risk (10/12/2020)   Humiliation, Afraid, Rape, and Kick questionnaire    Fear of Current or Ex-Partner: No    Emotionally Abused: No    Physically Abused: No    Sexually Abused: No    Outpatient Medications Prior to Visit  Medication Sig Dispense Refill   albuterol (PROVENTIL HFA;VENTOLIN HFA) 108 (90 Base) MCG/ACT inhaler Inhale 2 puffs into the lungs 4 (four) times daily as needed. Shortness of breath (Patient taking differently: Inhale 2 puffs into the lungs 4 (four) times daily as needed for wheezing or shortness of breath.) 1 Inhaler 6   albuterol (PROVENTIL) (2.5 MG/3ML) 0.083% nebulizer solution INHALE 1 VIAL BY NEBULIZER AS  DIRECTED (Patient taking differently: Take 2.5 mg by nebulization 4 (four) times daily as needed for wheezing or shortness of breath.) 75 mL 0   Blood Glucose Monitoring Suppl (ONETOUCH VERIO FLEX SYSTEM) w/Device KIT Check blood sugar up to 4 times a day. Dx Code: E11.65 1 kit 0   glucose  blood (ONETOUCH VERIO) test strip Check blood sugar up to 4 times a day. Dx Code: E11.65 400 each 1   hydrochlorothiazide (HYDRODIURIL) 25 MG tablet TAKE 1 TABLET(25 MG) BY MOUTH DAILY 90 tablet 1   Lancets (ONETOUCH DELICA PLUS [id number]) MISC Check blood sugar up to 4 times a day. Dx Code: E11.65 400 each 1   lisinopril (ZESTRIL) 5 MG tablet Take 1 tablet (5 mg total) by mouth daily. 90 tablet 3   metFORMIN (GLUCOPHAGE) 500 MG tablet TAKE 1 TABLET(500 MG) BY MOUTH TWICE DAILY WITH A MEAL 90 tablet 1   Polyethyl Glycol-Propyl Glycol (SYSTANE OP) Apply to eye.     rosuvastatin (CRESTOR) 10 MG tablet TAKE 1 TABLET(10 MG) BY MOUTH DAILY 90 tablet 3   valACYclovir (VALTREX) 1000 MG tablet TAKE 1 TABLET BY MOUTH THREE TIMES DAILY FOR 7 DAYS THEN TAKE 1 TABLET BY MOUTH EVERY DAY 42 tablet 5   OZEMPIC, 1 MG/DOSE, 4 MG/3ML SOPN INJECT 1 MG UNDER THE SKIN ONCE A WEEK AS DIRECTED 3 mL 3   COVID-19 mRNA bivalent vaccine, Pfizer, (PFIZER COVID-19 VAC BIVALENT) injection Inject into the muscle. (Patient not taking: Reported on 07/10/2021) 0.3 mL 0   No facility-administered medications prior to visit.    Allergies  Allergen Reactions   Codeine Nausea And Vomiting   Interferon Beta-1a Nausea And Vomiting and Other (See Comments)    Severe flu-like symptoms Symptoms expected   Sulfonamide Derivatives Nausea Only    Review of Systems  Constitutional:  Negative for fever and malaise/fatigue.  HENT:  Negative for congestion.   Eyes:  Negative for blurred vision.  Respiratory:  Negative for shortness of breath.   Cardiovascular:  Negative for chest pain, palpitations and leg swelling.  Gastrointestinal:  Negative for  abdominal pain, blood in stool and nausea.  Genitourinary:  Negative for dysuria and frequency.  Musculoskeletal:  Negative for falls.  Skin:  Negative for rash.  Neurological:  Negative for dizziness, loss of consciousness and headaches.  Endo/Heme/Allergies:  Negative for environmental allergies.  Psychiatric/Behavioral:  Negative for depression. The patient is not nervous/anxious.        Objective:    Physical Exam Vitals and nursing note reviewed.  Constitutional:      General: She is not in acute distress.    Appearance: Normal appearance. She is not ill-appearing.  HENT:     Head: Normocephalic and atraumatic.     Right Ear: External ear normal.     Left Ear: External ear normal.  Eyes:     Extraocular Movements: Extraocular movements intact.     Pupils: Pupils are equal, round, and reactive to light.  Cardiovascular:     Rate and Rhythm: Normal rate and regular rhythm.     Heart sounds: Normal heart sounds. No murmur heard.    No gallop.  Pulmonary:     Effort: Pulmonary effort is normal. No respiratory distress.     Breath sounds: Normal breath sounds. No wheezing or rales.  Skin:    General: Skin is warm and dry.  Neurological:     Mental Status: She is alert and oriented to person, place, and time.  Psychiatric:        Judgment: Judgment normal.     BP 124/70 (BP Location: Right Arm, Patient Position: Sitting, Cuff Size: Large)   Pulse 77   Temp 97.7 F (36.5 C) (Oral)   Resp 18   Ht _0  (1.6 m)   Wt 262 lb 6.4 oz (  119 kg)   SpO2 100%   BMI 46.48 kg/m  Wt Readings from Last 3 Encounters:  07/10/21 262 lb 6.4 oz (119 kg)  02/04/21 258 lb 12.8 oz (117.4 kg)  01/06/21 257 lb 9.6 oz (116.8 kg)    Diabetic Foot Exam - Simple   No data filed    Lab Results  Component Value Date   WBC 3.4 (L) 01/06/2021   HGB 13.5 01/06/2021   HCT 39.9 01/06/2021   PLT 205.0 01/06/2021   GLUCOSE 118 (H) 07/10/2021   CHOL 149 07/10/2021   TRIG 72.0 07/10/2021    HDL 41.80 07/10/2021   LDLCALC 93 07/10/2021   ALT 26 07/10/2021   AST 15 07/10/2021   NA 140 07/10/2021   K 3.9 07/10/2021   CL 102 07/10/2021   CREATININE 0.77 07/10/2021   BUN 11 07/10/2021   CO2 30 07/10/2021   TSH 1.65 09/05/2015   INR 1.04 02/11/2010   HGBA1C 5.9 07/10/2021   MICROALBUR <0.7 07/10/2021    Lab Results  Component Value Date   TSH 1.65 09/05/2015   Lab Results  Component Value Date   WBC 3.4 (L) 01/06/2021   HGB 13.5 01/06/2021   HCT 39.9 01/06/2021   MCV 88.0 01/06/2021   PLT 205.0 01/06/2021   Lab Results  Component Value Date   NA 140 07/10/2021   K 3.9 07/10/2021   CO2 30 07/10/2021   GLUCOSE 118 (H) 07/10/2021   BUN 11 07/10/2021   CREATININE 0.77 07/10/2021   BILITOT 0.6 07/10/2021   ALKPHOS 58 07/10/2021   AST 15 07/10/2021   ALT 26 07/10/2021   PROT 6.6 07/10/2021   ALBUMIN 4.0 07/10/2021   CALCIUM 9.0 07/10/2021   ANIONGAP 10 07/21/2018   GFR 82.25 07/10/2021   Lab Results  Component Value Date   CHOL 149 07/10/2021   Lab Results  Component Value Date   HDL 41.80 07/10/2021   Lab Results  Component Value Date   LDLCALC 93 07/10/2021   Lab Results  Component Value Date   TRIG 72.0 07/10/2021   Lab Results  Component Value Date   CHOLHDL 4 07/10/2021   Lab Results  Component Value Date   HGBA1C 5.9 07/10/2021       Assessment & Plan:   Problem List Items Addressed This Visit       Unprioritized   Type 2 diabetes mellitus with hyperglycemia, without long-term current use of insulin ([place])    hgba1c to be checked  minimize simple carbs. Increase exercise as tolerated. Continue current meds Inc ozempic to 2 mg weekly      Relevant Medications   Semaglutide, 2 MG/DOSE, 8 MG/3ML SOPN   Other Relevant Orders   Hemoglobin A1c (Completed)   Microalbumin / creatinine urine ratio (Completed)   Hyperlipidemia associated with type 2 diabetes mellitus ([place])    Tolerating statin, encouraged heart healthy diet, avoid  trans fats, minimize simple carbs and saturated fats. Increase exercise as tolerated      Relevant Medications   Semaglutide, 2 MG/DOSE, 8 MG/3ML SOPN   Essential hypertension    Well controlled, no changes to meds. Encouraged heart healthy diet such as the DASH diet and exercise as tolerated.       Other Visit Diagnoses     Hyperlipidemia, unspecified hyperlipidemia type    -  Primary   Relevant Orders   Lipid panel (Completed)   Comprehensive metabolic panel (Completed)   Primary hypertension  Relevant Orders   Microalbumin / creatinine urine ratio (Completed)   Colon cancer screening            Meds ordered this encounter  Medications   Semaglutide, 2 MG/DOSE, 8 MG/3ML SOPN    Sig: Inject 2 mg as directed once a week.    Dispense:  3 mL    Refill:  3    I, [name], DO, personally preformed the services described in this documentation.  All medical record entries made by the scribe were at my direction and in my presence.  I have reviewed the chart and discharge instructions (if applicable) and agree that the record reflects my personal performance and is accurate and complete. 07/10/2021   I,Molly Klein,acting as a scribe for [name], DO.,have documented all relevant documentation on the behalf of [name], DO,as directed by  [name], DO while in the presence of [name], DO.   [name], DO

## 2021-07-10 NOTE — Assessment & Plan Note (Signed)
hgba1c to be checked  minimize simple carbs. Increase exercise as tolerated. Continue current meds Inc ozempic to 2 mg weekly

## 2021-07-10 NOTE — Assessment & Plan Note (Signed)
Well controlled, no changes to meds. Encouraged heart healthy diet such as the DASH diet and exercise as tolerated.  °

## 2021-07-10 NOTE — Assessment & Plan Note (Signed)
Tolerating statin, encouraged heart healthy diet, avoid trans fats, minimize simple carbs and saturated fats. Increase exercise as tolerated 

## 2021-07-16 ENCOUNTER — Other Ambulatory Visit: Payer: Self-pay | Admitting: *Deleted

## 2021-07-16 DIAGNOSIS — I10 Essential (primary) hypertension: Secondary | ICD-10-CM

## 2021-07-16 MED ORDER — LISINOPRIL 5 MG PO TABS: 5.0000 mg | ORAL_TABLET | Freq: Every day | ORAL | 3 refills | Status: AC

## 2021-07-23 ENCOUNTER — Encounter: Payer: Self-pay | Admitting: Registered"

## 2021-07-23 ENCOUNTER — Encounter: Payer: Medicare HMO | Attending: Obstetrics and Gynecology | Admitting: Registered"

## 2021-07-23 DIAGNOSIS — E1165 Type 2 diabetes mellitus with hyperglycemia: Secondary | ICD-10-CM | POA: Diagnosis present

## 2021-07-23 NOTE — Progress Notes (Signed)
Diabetes Self-Management Education  Visit Type:  Follow-up  Appt. Start Time: 10:10 Appt. End Time: 10:41  07/23/2021  [name], identified by name and date of birth, is a 61 y.o. female with a diagnosis of Diabetes:  .   ASSESSMENT  Recent A1c was 5.9. a decrease from 6.1 6 months ago.  States she has a little tingling on her feet and has an appt today to get a pedicure. Pt shows me a spot on her foot which she states is like a dry patch. Pt was informed to update her PCP about tingling and unusual spot on her right foot. States she just increased her dosage of Ozempic.   States she is taking ACV gummies and cleanse to help reduce constipation. States she has BM once/day and without taking gummies and cleanse she will have BM every 2 days.    Weight 262 lb 3.2 oz (118.9 kg). Body mass index is 46.45 kg/m.    Diabetes Self-Management Education - 55/73/22 0254       Complications   Last HgB A1C per patient/outside source 5.9 %    How often do you check your blood sugar? 0 times/day (not testing)    Number of hypoglycemic episodes per month 0    Number of hyperglycemic episodes ( >'200mg'$ /dL): Never    Can you tell when your blood sugar is high? Yes    What do you do if your blood sugar is high? drinks water    Have you had a dilated eye exam in the past 12 months? Yes    Have you had a dental exam in the past 12 months? No    Are you checking your feet? No      Dietary Intake   Breakfast Herbal Life shake    Lunch Mimi's-salad (berries, nuts cheese, honey mustard)    Dinner shrimp lo mein    Beverage(s) water (3*16 oz; 48 oz), shake (16 oz), fat-burning tea; 64 oz      Activity / Exercise   How many days per week do you exercise? 0    How many minutes per day do you exercise? 0    Total minutes per week of exercise 0      Patient Education   Previous Diabetes Education Yes (please comment)    Being Active Role of exercise on diabetes management, blood  pressure control and cardiac health.      Post-Education Assessment   Patient undertands incorporating physical activity into lifestyle. Comprehends key points      Outcomes   Program Status Not Completed      Subsequent Visit   Since your last visit have you continued or begun to take your medications as prescribed? Yes    Since your last visit, are you checking your blood glucose at least once a day? No             Learning Objective:  Patient will have a greater understanding of diabetes self-management. Patient education plan is to attend individual and/or group sessions per assessed needs and concerns.   Plan:   Patient Instructions  - Aim to increase movement with walking in neighborhood to at least 60 minutes, 2 times a week.  - Aim to walk to and from mailbox daily.   - Aim to have increase water intake by adding in another bottle of water daily.      Expected Outcomes:  Demonstrated interest in learning. Expect positive outcomes  Education material provided: none  If problems or questions, patient to contact team via:  Phone and Email  Future DSME appointment: - 3-4 months

## 2021-07-23 NOTE — Patient Instructions (Signed)
-   Aim to increase movement with walking in neighborhood to at least 60 minutes, 2 times a week.  - Aim to walk to and from mailbox daily.   - Aim to have increase water intake by adding in another bottle of water daily.

## 2021-09-30 NOTE — Telephone Encounter (Signed)
error 

## 2021-10-13 ENCOUNTER — Ambulatory Visit: Payer: Medicare HMO

## 2021-10-15 ENCOUNTER — Ambulatory Visit (INDEPENDENT_AMBULATORY_CARE_PROVIDER_SITE_OTHER): Payer: Medicare HMO | Admitting: *Deleted

## 2021-10-15 DIAGNOSIS — Z0000 Encounter for general adult medical examination without abnormal findings: Secondary | ICD-10-CM

## 2021-10-15 DIAGNOSIS — Z780 Asymptomatic menopausal state: Secondary | ICD-10-CM | POA: Diagnosis not present

## 2021-10-15 NOTE — Patient Instructions (Signed)
Molly Klein , Thank you for taking time to come for your Medicare Wellness Visit. I appreciate your ongoing commitment to your health goals. Please review the following plan we discussed and let me know if I can assist you in the future.   These are the goals we discussed:  Goals      Exercise 3x per week (30 min per time)     Weight < 200 lb (90.719 kg)        This is a list of the screening recommended for you and due dates:  Health Maintenance  Topic Date Due   Eye exam for diabetics  Never done   Zoster (Shingles) Vaccine (1 of 2) Never done   Cologuard (Stool DNA test)  Never done   COVID-19 Vaccine (6 - Pfizer risk series) 03/03/2021   Flu Shot  09/02/2021   Complete foot exam   01/06/2022   Hemoglobin A1C  01/09/2022   Mammogram  03/20/2022   Pap Smear  03/09/2023   Tetanus Vaccine  06/26/2030   Hepatitis C Screening: USPSTF Recommendation to screen - Ages 18-79 yo.  Completed   HIV Screening  Completed   HPV Vaccine  Aged Out   Colon Cancer Screening  Discontinued       Next appointment: Follow up in one year for your annual wellness visit.   Preventive Care 40-64 Years, Female Preventive care refers to lifestyle choices and visits with your health care provider that can promote health and wellness. What does preventive care include? A yearly physical exam. This is also called an annual well check. Dental exams once or twice a year. Routine eye exams. Ask your health care provider how often you should have your eyes checked. Personal lifestyle choices, including: Daily care of your teeth and gums. Regular physical activity. Eating a healthy diet. Avoiding tobacco and drug use. Limiting alcohol use. Practicing safe sex. Taking low-dose aspirin daily starting at age 26. Taking vitamin and mineral supplements as recommended by your health care provider. What happens during an annual well check? The services and screenings done by your health care provider  during your annual well check will depend on your age, overall health, lifestyle risk factors, and family history of disease. Counseling  Your health care provider may ask you questions about your: Alcohol use. Tobacco use. Drug use. Emotional well-being. Home and relationship well-being. Sexual activity. Eating habits. Work and work Statistician. Method of birth control. Menstrual cycle. Pregnancy history. Screening  You may have the following tests or measurements: Height, weight, and BMI. Blood pressure. Lipid and cholesterol levels. These may be checked every 5 years, or more frequently if you are over 66 years old. Skin check. Lung cancer screening. You may have this screening every year starting at age 56 if you have a 30-pack-year history of smoking and currently smoke or have quit within the past 15 years. Fecal occult blood test (FOBT) of the stool. You may have this test every year starting at age 83. Flexible sigmoidoscopy or colonoscopy. You may have a sigmoidoscopy every 5 years or a colonoscopy every 10 years starting at age 26. Hepatitis C blood test. Hepatitis B blood test. Sexually transmitted disease (STD) testing. Diabetes screening. This is done by checking your blood sugar (glucose) after you have not eaten for a while (fasting). You may have this done every 1-3 years. Mammogram. This may be done every 1-2 years. Talk to your health care provider about when you should start having regular mammograms.  This may depend on whether you have a family history of breast cancer. BRCA-related cancer screening. This may be done if you have a family history of breast, ovarian, tubal, or peritoneal cancers. Pelvic exam and Pap test. This may be done every 3 years starting at age 18. Starting at age 75, this may be done every 5 years if you have a Pap test in combination with an HPV test. Bone density scan. This is done to screen for osteoporosis. You may have this scan if you are  at high risk for osteoporosis. Discuss your test results, treatment options, and if necessary, the need for more tests with your health care provider. Vaccines  Your health care provider may recommend certain vaccines, such as: Influenza vaccine. This is recommended every year. Tetanus, diphtheria, and acellular pertussis (Tdap, Td) vaccine. You may need a Td booster every 10 years. Zoster vaccine. You may need this after age 44. Pneumococcal 13-valent conjugate (PCV13) vaccine. You may need this if you have certain conditions and were not previously vaccinated. Pneumococcal polysaccharide (PPSV23) vaccine. You may need one or two doses if you smoke cigarettes or if you have certain conditions. Talk to your health care provider about which screenings and vaccines you need and how often you need them. This information is not intended to replace advice given to you by your health care provider. Make sure you discuss any questions you have with your health care provider. Document Released: 02/15/2015 Document Revised: 10/09/2015 Document Reviewed: 11/20/2014 Elsevier Interactive Patient Education  2017 [place] Prevention in the Home Falls can cause injuries. They can happen to people of all ages. There are many things you can do to make your home safe and to help prevent falls. What can I do on the outside of my home? Regularly fix the edges of walkways and driveways and fix any cracks. Remove anything that might make you trip as you walk through a door, such as a raised step or threshold. Trim any bushes or trees on the path to your home. Use bright outdoor lighting. Clear any walking paths of anything that might make someone trip, such as rocks or tools. Regularly check to see if handrails are loose or broken. Make sure that both sides of any steps have handrails. Any raised decks and porches should have guardrails on the edges. Have any leaves, snow, or ice cleared  regularly. Use sand or salt on walking paths during winter. Clean up any spills in your garage right away. This includes oil or grease spills. What can I do in the bathroom? Use night lights. Install grab bars by the toilet and in the tub and shower. Do not use towel bars as grab bars. Use non-skid mats or decals in the tub or shower. If you need to sit down in the shower, use a plastic, non-slip stool. Keep the floor dry. Clean up any water that spills on the floor as soon as it happens. Remove soap buildup in the tub or shower regularly. Attach bath mats securely with double-sided non-slip rug tape. Do not have throw rugs and other things on the floor that can make you trip. What can I do in the bedroom? Use night lights. Make sure that you have a light by your bed that is easy to reach. Do not use any sheets or blankets that are too big for your bed. They should not hang down onto the floor. Have a firm chair that has side arms.  You can use this for support while you get dressed. Do not have throw rugs and other things on the floor that can make you trip. What can I do in the kitchen? Clean up any spills right away. Avoid walking on wet floors. Keep items that you use a lot in easy-to-reach places. If you need to reach something above you, use a strong step stool that has a grab bar. Keep electrical cords out of the way. Do not use floor polish or wax that makes floors slippery. If you must use wax, use non-skid floor wax. Do not have throw rugs and other things on the floor that can make you trip. What can I do with my stairs? Do not leave any items on the stairs. Make sure that there are handrails on both sides of the stairs and use them. Fix handrails that are broken or loose. Make sure that handrails are as long as the stairways. Check any carpeting to make sure that it is firmly attached to the stairs. Fix any carpet that is loose or worn. Avoid having throw rugs at the top or  bottom of the stairs. If you do have throw rugs, attach them to the floor with carpet tape. Make sure that you have a light switch at the top of the stairs and the bottom of the stairs. If you do not have them, ask someone to add them for you. What else can I do to help prevent falls? Wear shoes that: Do not have high heels. Have rubber bottoms. Are comfortable and fit you well. Are closed at the toe. Do not wear sandals. If you use a stepladder: Make sure that it is fully opened. Do not climb a closed stepladder. Make sure that both sides of the stepladder are locked into place. Ask someone to hold it for you, if possible. Clearly mark and make sure that you can see: Any grab bars or handrails. First and last steps. Where the edge of each step is. Use tools that help you move around (mobility aids) if they are needed. These include: Canes. Walkers. Scooters. Crutches. Turn on the lights when you go into a dark area. Replace any light bulbs as soon as they burn out. Set up your furniture so you have a clear path. Avoid moving your furniture around. If any of your floors are uneven, fix them. If there are any pets around you, be aware of where they are. Review your medicines with your doctor. Some medicines can make you feel dizzy. This can increase your chance of falling. Ask your doctor what other things that you can do to help prevent falls. This information is not intended to replace advice given to you by your health care provider. Make sure you discuss any questions you have with your health care provider. Document Released: 11/15/2008 Document Revised: 06/27/2015 Document Reviewed: 02/23/2014 Elsevier Interactive Patient Education  2017 [employer].

## 2021-10-15 NOTE — Progress Notes (Addendum)
Subjective:   Molly Klein is a 61 y.o. female who presents for Medicare Annual (Subsequent) preventive examination.  I connected with  Juanita Craver [place] on 10/15/21 by a audio enabled telemedicine application and verified that I am speaking with the correct person using two identifiers.  Patient Location: Home  Provider Location: Office/Clinic  I discussed the limitations of evaluation and management by telemedicine. The patient expressed understanding and agreed to proceed.   Review of Systems    Defer to PCP Cardiac Risk Factors include: advanced age (>41mn, >>21women);diabetes mellitus;dyslipidemia;hypertension;sedentary lifestyle     Objective:    There were no vitals filed for this visit. There is no height or weight on file to calculate BMI.     10/15/2021    9:41 AM 10/12/2020    8:02 AM 04/03/2020   11:09 AM 07/20/2018    4:50 PM 07/14/2018   10:12 AM 04/06/2018    8:45 AM 05/11/2016    8:39 PM  Advanced Directives  Does Patient Have a Medical Advance Directive? No Yes Yes No No No No  Type of [place]porate [place] will       Does patient want to make changes to medical advance directive?   No - Patient declined      Copy of [place]?  No - copy requested       Would patient like information on creating a medical advance directive? No - Patient declined   No - Patient declined Yes (MAU/Ambulatory/Procedural Areas - Information given) Yes (MAU/Ambulatory/Procedural Areas - Information given) Yes (MAU/Ambulatory/Procedural Areas - Information given)    Current Medications (verified) Outpatient Encounter Medications as of 10/15/2021  Medication Sig   albuterol (PROVENTIL HFA;VENTOLIN HFA) 108 (90 Base) MCG/ACT inhaler Inhale 2 puffs into the lungs 4 (four) times daily as needed. Shortness of breath (Patient taking differently: Inhale 2 puffs into the lungs 4 (four) times daily as needed for wheezing or  shortness of breath.)   albuterol (PROVENTIL) (2.5 MG/3ML) 0.083% nebulizer solution INHALE 1 VIAL BY NEBULIZER AS DIRECTED (Patient taking differently: Take 2.5 mg by nebulization 4 (four) times daily as needed for wheezing or shortness of breath.)   Blood Glucose Monitoring Suppl (ONETOUCH VERIO FLEX SYSTEM) w/Device KIT Check blood sugar up to 4 times a day. Dx Code: E11.65   glucose blood (ONETOUCH VERIO) test strip Check blood sugar up to 4 times a day. Dx Code: E11.65   hydrochlorothiazide (HYDRODIURIL) 25 MG tablet TAKE 1 TABLET(25 MG) BY MOUTH DAILY   Lancets (ONETOUCH DELICA PLUS [id number] MISC Check blood sugar up to 4 times a day. Dx Code: E11.65   lisinopril (ZESTRIL) 5 MG tablet Take 1 tablet (5 mg total) by mouth daily.   metFORMIN (GLUCOPHAGE) 500 MG tablet TAKE 1 TABLET(500 MG) BY MOUTH TWICE DAILY WITH A MEAL   Polyethyl Glycol-Propyl Glycol (SYSTANE OP) Apply to eye.   rosuvastatin (CRESTOR) 10 MG tablet TAKE 1 TABLET(10 MG) BY MOUTH DAILY   Semaglutide, 2 MG/DOSE, 8 MG/3ML SOPN Inject 2 mg as directed once a week.   valACYclovir (VALTREX) 1000 MG tablet TAKE 1 TABLET BY MOUTH THREE TIMES DAILY FOR 7 DAYS THEN TAKE 1 TABLET BY MOUTH EVERY DAY   No facility-administered encounter medications on file as of 10/15/2021.    Allergies (verified) Codeine, Interferon beta-1a, and Sulfonamide derivatives   History: Past Medical History:  Diagnosis Date   Asthma    Bruising 10/21/2012  Cervical spinal stenosis    L4-5   DVT (deep venous thrombosis) (HCC)    GERD (gastroesophageal reflux disease)    Hepatitis C    Treated and cured per pt   History of DVT of lower extremity 10/21/2012   Hypertension    Knee pain    left    Neuromuscular disorder (HCC)    neuropathy lt arm   Nipple discharge    right breast   OSA (obstructive sleep apnea)    Past Surgical History:  Procedure Laterality Date   ABDOMINAL HYSTERECTOMY     ANTERIOR CERVICAL DECOMP/DISCECTOMY FUSION N/A  07/20/2018   Procedure: c4-5ANTERIOR CERVICAL DECOMPRESSION/DISCECTOMY FUSION, ALLOGRAFT & PLATE;  Surgeon: Marybelle Killings, MD;  Location: [facility];  Service: Orthopedics;  Laterality: N/A;   BREAST BIOPSY  02/06/2011   Procedure: BREAST BIOPSY;  Surgeon: Earnstine Regal, MD;  Location: [facility];  Service: General;  Laterality: Right;  right breast biopsy   CHOLECYSTECTOMY     DILATION AND CURETTAGE OF UTERUS     FRACTURE SURGERY     fx lt arm   Nipple repair     PILONIDAL CYST EXCISION     right knee surgery     TONSILLECTOMY     Family History  Problem Relation Age of Onset   Heart attack Mother        mi in 19s   Alcohol abuse Mother        cirrhosis of liver   Heart attack Father        MI in 63s   Rectal cancer Maternal Grandmother    Cancer Maternal Grandmother        breast   Cancer Sister        pt unaware of what kind   Social History   Socioeconomic History   Marital status: Widowed    Spouse name: Not on file   Number of children: 0   Years of education: Not on file   Highest education level: Not on file  Occupational History   Occupation: retired    Fish farm manager: DISABLED  Tobacco Use   Smoking status: Former    Years: 5.00    Types: Cigarettes    Quit date: 02/03/1975    Years since quitting: 46.7   Smokeless tobacco: Never   Tobacco comments:    1 pack per week  Vaping Use   Vaping Use: Never used  Substance and Sexual Activity   Alcohol use: No    Alcohol/week: 0.0 standard drinks of alcohol   Drug use: No   Sexual activity: Yes    Partners: Male  Other Topics Concern   Not on file  Social History Narrative   No children - lives alone   Recovering alcoholic   Enjoys gambling, traveling and riding motorcycles   Sister lives nearby - very close   Social Determinants of Health   Financial Resource Strain: [place]  (10/12/2020)   Overall Financial Resource Strain (CARDIA)    Difficulty of Paying Living Expenses: Not hard at all  Food  Insecurity: No [place] (10/12/2020)   Hunger Vital Sign    Worried About Running Out of Food in the Last Year: Never true    [place] in the Last Year: Never true  Transportation Needs: No Transportation Needs (10/12/2020)   PRAPARE - Hydrologist (Medical): No    Lack of Transportation (Non-Medical): No  Physical Activity: Insufficiently Active (  10/12/2020)   Exercise Vital Sign    Days of Exercise per Week: 7 days    Minutes of Exercise per Session: 10 min  Stress: No Stress Concern Present (10/12/2020)   Love    Feeling of Stress : Not at all  Social Connections: Socially Isolated (10/12/2020)   Social Connection and Isolation Panel [NHANES]    Frequency of Communication with Friends and Family: More than three times a week    Frequency of Social Gatherings with Friends and Family: More than three times a week    Attends Religious Services: Never    Marine scientist or Organizations: No    Attends Archivist Meetings: Never    Marital Status: Widowed    Tobacco Counseling Counseling given: Not Answered Tobacco comments: 1 pack per week   Clinical Intake:  Pre-visit preparation completed: Yes  Pain : No/denies pain     Diabetes: Yes CBG done?: No Did pt. bring in CBG monitor from home?: No (audio visit)  How often do you need to have someone help you when you read instructions, pamphlets, or other written materials from your doctor or pharmacy?: 1 - Never  Diabetic? Yes Nutrition Risk Assessment:  Has the patient had any N/V/D within the last 2 months?  No  Does the patient have any non-healing wounds?  No  Has the patient had any unintentional weight loss or weight gain?  No   Diabetes:  Is the patient diabetic?  Yes  If diabetic, was a CBG obtained today?  No  Did the patient bring in their glucometer from home?   Audio visit How  often do you monitor your CBG's? Doesn't check them.   Financial Strains and Diabetes Management:  Are you having any financial strains with the device, your supplies or your medication? No .  Does the patient want to be seen by Chronic Care Management for management of their diabetes?  No  Would the patient like to be referred to a Nutritionist or for Diabetic Management?  No   Diabetic Exams:  Diabetic Eye Exam: Overdue for diabetic eye exam. Pt has been advised about the importance in completing this exam. Patient advised to call and schedule an eye exam. Diabetic Foot Exam: Completed 01/06/21   Interpreter Needed?: No  Information entered by :: Beatris Ship, [place]   Activities of Daily Living    10/15/2021    9:45 AM  In your present state of health, do you have any difficulty performing the following activities:  Hearing? 0  Vision? 0  Difficulty concentrating or making decisions? 0  Walking or climbing stairs? 0  Dressing or bathing? 0  Doing errands, shopping? 0  Preparing Food and eating ? N  Using the Toilet? N  In the past six months, have you accidently leaked urine? N  Do you have problems with loss of bowel control? N  Managing your Medications? N  Managing your Finances? N  Housekeeping or managing your Housekeeping? N    Patient Care Team: Carollee Herter, Alferd Apa, DO as PCP - General (Family Medicine) Juanita Craver, MD as Consulting Physician (Gastroenterology) Warden Fillers, MD as Consulting Physician (Ophthalmology) Carlyon Shadow, MD as Consulting Physician (Obstetrics and Gynecology) Chesley Mires, MD as Consulting Physician (Pulmonary Disease) [facility] (Dermatology) Pixie Casino, MD as Consulting Physician (Cardiology) Marybelle Killings, MD as Consulting Physician (Orthopedic Surgery)  Indicate any recent Medical Services you may have  received from other than [facility] providers in the past year (date may be approximate).      Assessment:   This is a routine wellness examination for [facility].  Hearing/Vision screen No results found.  Dietary issues and exercise activities discussed: Current Exercise Habits: The patient does not participate in regular exercise at present, Exercise limited by: None identified   Goals Addressed   None    Depression Screen    10/15/2021    9:44 AM 10/12/2020    7:59 AM 04/03/2020   11:10 AM 04/02/2020   10:27 AM 09/05/2015   10:37 AM 09/02/2015    9:20 AM 11/12/2014    8:57 AM  PHQ 2/9 Scores  PHQ - 2 Score 0 0 0 1 0 0 0    Fall Risk    10/15/2021    9:42 AM 10/12/2020    8:03 AM 04/03/2020   11:10 AM 04/02/2020   10:37 AM 09/05/2015   10:37 AM  Fall Risk   Falls in the past year? 0 0 0 0 No  Number falls in past yr: 0 0     Injury with Fall? 0 0     Risk for fall due to : No Fall Risks Impaired vision;Orthopedic patient  No Fall Risks   Follow up Falls evaluation completed Falls prevention discussed  Falls evaluation completed     [place]:  Any stairs in or around the home? No  If so, are there any without handrails?  No stairs Home free of loose throw rugs in walkways, pet beds, electrical cords, etc? Yes  Adequate lighting in your home to reduce risk of falls? Yes   ASSISTIVE DEVICES UTILIZED TO PREVENT FALLS:  Life alert? No  Use of a cane, walker or w/c? No  Grab bars in the bathroom? Yes  Shower chair or bench in shower? Yes  Elevated toilet seat or a handicapped toilet? Yes   Cognitive Function:        10/15/2021    9:59 AM 10/12/2020    8:04 AM  6CIT Screen  What Year? 0 points 0 points  What month? 0 points 0 points  What time? 0 points 0 points  Count back from 20 0 points 0 points  Months in reverse 0 points 2 points  Repeat phrase 0 points 4 points  Total Score 0 points 6 points    Immunizations Immunization History  Administered Date(s) Administered   Influenza Split 10/22/2011   Influenza,inj,Quad PF,6+  Mos 11/02/2013   PFIZER Comirnaty(Gray Top)Covid-19 Tri-Sucrose Vaccine 03/23/2019, 05/15/2019, 11/21/2019, 05/30/2020   PNEUMOCOCCAL CONJUGATE-20 06/25/2020   Pfizer Covid-19 Vaccine Bivalent Booster 52yr & up 01/06/2021   Td 10/01/2003   Tdap 06/25/2020    TDAP status: Up to date  Flu Vaccine status: Due, Education has been provided regarding the importance of this vaccine. Advised may receive this vaccine at local pharmacy or Health Dept. Aware to provide a copy of the vaccination record if obtained from local pharmacy or Health Dept. Verbalized acceptance and understanding.  Pneumococcal vaccine status: Up to date  Covid-19 vaccine status: Information provided on how to obtain vaccines.   Qualifies for Shingles Vaccine? Yes   Zostavax completed No   Shingrix Completed?: No.    Education has been provided regarding the importance of this vaccine. Patient has been advised to call insurance company to determine out of pocket expense if they have not yet received this vaccine. Advised may also receive vaccine at  local pharmacy or Health Dept. Verbalized acceptance and understanding.  Screening Tests Health Maintenance  Topic Date Due   OPHTHALMOLOGY EXAM  Never done   Zoster Vaccines- Shingrix (1 of 2) Never done   Fecal DNA (Cologuard)  Never done   COVID-19 Vaccine (6 - Pfizer risk series) 03/03/2021   INFLUENZA VACCINE  09/02/2021   FOOT EXAM  01/06/2022   HEMOGLOBIN A1C  01/09/2022   MAMMOGRAM  03/20/2022   PAP SMEAR-Modifier  03/09/2023   TETANUS/TDAP  06/26/2030   Hepatitis C Screening  Completed   HIV Screening  Completed   HPV VACCINES  Aged Out   COLONOSCOPY (Pts 45-90yr Insurance coverage will need to be confirmed)  Discontinued    Health Maintenance  Health Maintenance Due  Topic Date Due   OPHTHALMOLOGY EXAM  Never done   Zoster Vaccines- Shingrix (1 of 2) Never done   Fecal DNA (Cologuard)  Never done   COVID-19 Vaccine (6 - Pfizer risk series) 03/03/2021    INFLUENZA VACCINE  09/02/2021    Colorectal cancer screening: No longer required. Pt preference.  Mammogram status: Completed 03/20/20. Repeat every year  Bone Density status: Ordered 10/15/21. Pt provided with contact info and advised to call to schedule appt.  Lung Cancer Screening: (Low Dose CT Chest recommended if Age 63-80years, 30 pack-year currently smoking OR have quit w/in 15years.) does not qualify.   Lung Cancer Screening Referral: N/a  Additional Screening:  Hepatitis C Screening: does qualify; Completed 10/12/20  Vision Screening: Recommended annual ophthalmology exams for early detection of glaucoma and other disorders of the eye. Is the patient up to date with their annual eye exam?  No  Who is the provider or what is the name of the office in which the patient attends annual eye exams? N/a If pt is not established with a provider, would they like to be referred to a provider to establish care? No .   Dental Screening: Recommended annual dental exams for proper oral hygiene  Community Resource Referral / Chronic Care Management: CRR required this visit?  No   CCM required this visit?  No      Plan:     I have personally reviewed and noted the following in the patient's chart:   Medical and social history Use of alcohol, tobacco or illicit drugs  Current medications and supplements including opioid prescriptions. Patient is not currently taking opioid prescriptions. Functional ability and status Nutritional status Physical activity Advanced directives List of other physicians Hospitalizations, surgeries, and ER visits in previous 12 months Vitals Screenings to include cognitive, depression, and falls Referrals and appointments  In addition, I have reviewed and discussed with patient certain preventive protocols, quality metrics, and best practice recommendations. A written personalized care plan for preventive services as well as general preventive  health recommendations were provided to patient.   Due to this being a telephonic visit, the after visit summary with patients personalized plan was offered to patient via mail or my-chart. patient was mailed a copy of AVS.  BBeatris Ship [place]  10/15/2021   Nurse Notes: None   I have reviewed and agree with [facility] documentation.  JKathlene November MD

## 2021-10-21 ENCOUNTER — Telehealth (HOSPITAL_BASED_OUTPATIENT_CLINIC_OR_DEPARTMENT_OTHER): Payer: Self-pay

## 2021-11-03 ENCOUNTER — Other Ambulatory Visit: Payer: Self-pay | Admitting: Family Medicine

## 2021-11-03 DIAGNOSIS — E1165 Type 2 diabetes mellitus with hyperglycemia: Secondary | ICD-10-CM

## 2021-11-04 MED ORDER — SEMAGLUTIDE (2 MG/DOSE) 8 MG/3ML ~~LOC~~ SOPN: 2.0000 mg | PEN_INJECTOR | SUBCUTANEOUS | 3 refills | Status: DC

## 2021-11-14 ENCOUNTER — Telehealth (HOSPITAL_BASED_OUTPATIENT_CLINIC_OR_DEPARTMENT_OTHER): Payer: Self-pay

## 2022-01-09 ENCOUNTER — Other Ambulatory Visit (HOSPITAL_BASED_OUTPATIENT_CLINIC_OR_DEPARTMENT_OTHER): Payer: Self-pay

## 2022-01-09 ENCOUNTER — Ambulatory Visit: Payer: Medicare HMO | Admitting: Family Medicine

## 2022-01-09 ENCOUNTER — Encounter: Payer: Self-pay | Admitting: Family Medicine

## 2022-01-09 VITALS — BP 138/88 | HR 85 | Temp 98.7°F | Resp 18 | Ht 63.0 in | Wt 249.0 lb

## 2022-01-09 DIAGNOSIS — J9611 Chronic respiratory failure with hypoxia: Secondary | ICD-10-CM | POA: Diagnosis not present

## 2022-01-09 DIAGNOSIS — K739 Chronic hepatitis, unspecified: Secondary | ICD-10-CM

## 2022-01-09 DIAGNOSIS — I10 Essential (primary) hypertension: Secondary | ICD-10-CM | POA: Diagnosis not present

## 2022-01-09 DIAGNOSIS — Z23 Encounter for immunization: Secondary | ICD-10-CM

## 2022-01-09 DIAGNOSIS — E785 Hyperlipidemia, unspecified: Secondary | ICD-10-CM | POA: Diagnosis not present

## 2022-01-09 DIAGNOSIS — G8320 Monoplegia of upper limb affecting unspecified side: Secondary | ICD-10-CM

## 2022-01-09 DIAGNOSIS — E1169 Type 2 diabetes mellitus with other specified complication: Secondary | ICD-10-CM

## 2022-01-09 DIAGNOSIS — E1165 Type 2 diabetes mellitus with hyperglycemia: Secondary | ICD-10-CM | POA: Diagnosis not present

## 2022-01-09 DIAGNOSIS — L84 Corns and callosities: Secondary | ICD-10-CM

## 2022-01-09 DIAGNOSIS — Z1211 Encounter for screening for malignant neoplasm of colon: Secondary | ICD-10-CM

## 2022-01-09 LAB — HEMOGLOBIN A1C: Hgb A1c MFr Bld: 5.3 % (ref 4.6–6.5)

## 2022-01-09 LAB — CBC WITH DIFFERENTIAL/PLATELET
Basophils Absolute: 0 10*3/uL (ref 0.0–0.1)
Basophils Relative: 0.7 % (ref 0.0–3.0)
Eosinophils Absolute: 0.1 10*3/uL (ref 0.0–0.7)
Eosinophils Relative: 2.9 % (ref 0.0–5.0)
HCT: 41.9 % (ref 36.0–46.0)
Hemoglobin: 14.5 g/dL (ref 12.0–15.0)
Lymphocytes Relative: 33.5 % (ref 12.0–46.0)
Lymphs Abs: 1.1 10*3/uL (ref 0.7–4.0)
MCHC: 34.6 g/dL (ref 30.0–36.0)
MCV: 88.2 fl (ref 78.0–100.0)
Monocytes Absolute: 0.2 10*3/uL (ref 0.1–1.0)
Monocytes Relative: 5.6 % (ref 3.0–12.0)
Neutro Abs: 1.9 10*3/uL (ref 1.4–7.7)
Neutrophils Relative %: 57.3 % (ref 43.0–77.0)
Platelets: 200 10*3/uL (ref 150.0–400.0)
RBC: 4.75 Mil/uL (ref 3.87–5.11)
RDW: 15 % (ref 11.5–15.5)
WBC: 3.4 10*3/uL — ABNORMAL LOW (ref 4.0–10.5)

## 2022-01-09 LAB — COMPREHENSIVE METABOLIC PANEL
ALT: 25 U/L (ref 0–35)
AST: 17 U/L (ref 0–37)
Albumin: 4.3 g/dL (ref 3.5–5.2)
Alkaline Phosphatase: 75 U/L (ref 39–117)
BUN: 10 mg/dL (ref 6–23)
CO2: 30 mEq/L (ref 19–32)
Calcium: 8.9 mg/dL (ref 8.4–10.5)
Chloride: 104 mEq/L (ref 96–112)
Creatinine, Ser: 0.74 mg/dL (ref 0.40–1.20)
GFR: 85.96 mL/min (ref >60.00)
Glucose, Bld: 106 mg/dL — ABNORMAL HIGH (ref 70–99)
Potassium: 3.6 mEq/L (ref 3.5–5.1)
Sodium: 141 mEq/L (ref 135–145)
Total Bilirubin: 0.5 mg/dL (ref 0.2–1.2)
Total Protein: 7.1 g/dL (ref 6.0–8.3)

## 2022-01-09 LAB — LIPID PANEL
Cholesterol: 158 mg/dL (ref 0–200)
HDL: 45.1 mg/dL (ref >39.00)
LDL Cholesterol: 98 mg/dL (ref 0–99)
NonHDL: 113.3
Total CHOL/HDL Ratio: 4
Triglycerides: 75 mg/dL (ref 0.0–149.0)
VLDL: 15 mg/dL (ref 0.0–40.0)

## 2022-01-09 MED ORDER — COMIRNATY 30 MCG/0.3ML IM SUSY
PREFILLED_SYRINGE | INTRAMUSCULAR | 0 refills | Status: DC
  Filled 2022-01-09: qty 0.3, 1d supply, fill #0

## 2022-01-09 MED ORDER — SEMAGLUTIDE (2 MG/DOSE) 8 MG/3ML ~~LOC~~ SOPN: 2.0000 mg | PEN_INJECTOR | SUBCUTANEOUS | 3 refills | Status: DC

## 2022-01-09 NOTE — Assessment & Plan Note (Signed)
Well controlled, no changes to meds. Encouraged heart healthy diet such as the DASH diet and exercise as tolerated.  °

## 2022-01-09 NOTE — Patient Instructions (Signed)

## 2022-01-09 NOTE — Assessment & Plan Note (Signed)
Encourage heart healthy diet such as MIND or DASH diet, increase exercise, avoid trans fats, simple carbohydrates and processed foods, consider a krill or fish or flaxseed oil cap daily.  °

## 2022-01-09 NOTE — Progress Notes (Signed)
Established Patient Office Visit  Subjective   Patient ID: Molly Klein, female    DOB: 1961-01-25  Age: 61 y.o. MRN: [id number]  Chief Complaint  Patient presents with   Hypertension   Hyperlipidemia   Diabetes   Follow-up    HPI    Review of Systems  Constitutional:  Negative for fever and malaise/fatigue.  HENT:  Negative for congestion.   Eyes:  Negative for blurred vision.  Respiratory:  Negative for cough and shortness of breath.   Cardiovascular:  Negative for chest pain, palpitations and leg swelling.  Gastrointestinal:  Negative for vomiting.  Musculoskeletal:  Negative for back pain.  Skin:  Negative for rash.  Neurological:  Negative for loss of consciousness and headaches.      Objective:     BP 138/88 (BP Location: Left Arm, Patient Position: Sitting, Cuff Size: Large)   Pulse 85   Temp 98.7 F (37.1 C) (Oral)   Resp 18   Ht '5\' 3"'$  (1.6 m)   Wt 249 lb (112.9 kg)   SpO2 98%   BMI 44.11 kg/m    Physical Exam Vitals and nursing note reviewed.  Constitutional:      Appearance: She is well-developed.  HENT:     Head: Normocephalic and atraumatic.  Eyes:     Conjunctiva/sclera: Conjunctivae normal.  Neck:     Thyroid: No thyromegaly.     Vascular: No carotid bruit or JVD.  Cardiovascular:     Rate and Rhythm: Normal rate and regular rhythm.     Heart sounds: Normal heart sounds. No murmur heard. Pulmonary:     Effort: Pulmonary effort is normal. No respiratory distress.     Breath sounds: Normal breath sounds. No wheezing or rales.  Chest:     Chest wall: No tenderness.  Musculoskeletal:     Cervical back: Normal range of motion and neck supple.  Neurological:     Mental Status: She is alert and oriented to person, place, and time.      No results found for any visits on 01/09/22.    The 10-year ASCVD risk score (Arnett DK, et al., 2019) is: 18.9%    Assessment & Plan:   Problem List Items Addressed This Visit        Unprioritized   Type 2 diabetes mellitus with hyperglycemia, without long-term current use of insulin (HCC)   Relevant Medications   Semaglutide, 2 MG/DOSE, 8 MG/3ML SOPN   Other Relevant Orders   Lipid panel   CBC with Differential/Platelet   Comprehensive metabolic panel   Hemoglobin A1c   Ambulatory referral to Podiatry   Microalbumin / creatinine urine ratio   Severe obesity (BMI >= 40) (HCC)   Relevant Medications   Semaglutide, 2 MG/DOSE, 8 MG/3ML SOPN   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        Relevant Medications   Semaglutide, 2 MG/DOSE, 8 MG/3ML SOPN   Essential hypertension    Well controlled, no changes to meds. Encouraged heart healthy diet such as the DASH diet and exercise as tolerated.        Relevant Orders   Lipid panel   CBC with Differential/Platelet   Comprehensive metabolic panel   Hemoglobin A1c   Microalbumin /  creatinine urine ratio   Chronic respiratory failure with hypoxia (HCC)   Chronic hepatitis ([place])   Other Visit Diagnoses     Hyperlipidemia, unspecified hyperlipidemia type    -  Primary   Relevant Orders   Lipid panel   CBC with Differential/Platelet   Comprehensive metabolic panel   Hemoglobin A1c   Microalbumin / creatinine urine ratio   Need for influenza vaccination       Relevant Orders   Flu Vaccine QUAD 6+ mos PF IM (Fluarix Quad PF) (Completed)   Colon cancer screening       Relevant Orders   Cologuard   Pre-ulcerative corn or callous       Relevant Orders   Ambulatory referral to Podiatry       Return in about 6 months (around 07/11/2022), or if symptoms worsen or fail to improve, for fasting, annual exam.    [name], DO

## 2022-01-12 ENCOUNTER — Other Ambulatory Visit (INDEPENDENT_AMBULATORY_CARE_PROVIDER_SITE_OTHER): Payer: Medicare HMO

## 2022-01-12 DIAGNOSIS — E1165 Type 2 diabetes mellitus with hyperglycemia: Secondary | ICD-10-CM

## 2022-01-12 DIAGNOSIS — I10 Essential (primary) hypertension: Secondary | ICD-10-CM | POA: Diagnosis not present

## 2022-01-12 DIAGNOSIS — E785 Hyperlipidemia, unspecified: Secondary | ICD-10-CM | POA: Diagnosis not present

## 2022-01-12 LAB — MICROALBUMIN / CREATININE URINE RATIO
Creatinine,U: 136.7 mg/dL
Microalb Creat Ratio: 0.5 mg/g (ref 0.0–30.0)
Microalb, Ur: <0.7 mg/dL (ref 0.0–1.9)

## 2022-01-28 ENCOUNTER — Encounter: Payer: Self-pay | Admitting: Podiatry

## 2022-01-28 ENCOUNTER — Ambulatory Visit: Payer: Medicare HMO | Admitting: Podiatry

## 2022-01-28 DIAGNOSIS — B351 Tinea unguium: Secondary | ICD-10-CM | POA: Diagnosis not present

## 2022-01-28 DIAGNOSIS — E1165 Type 2 diabetes mellitus with hyperglycemia: Secondary | ICD-10-CM

## 2022-01-28 DIAGNOSIS — M79674 Pain in right toe(s): Secondary | ICD-10-CM

## 2022-01-28 DIAGNOSIS — M79675 Pain in left toe(s): Secondary | ICD-10-CM | POA: Diagnosis not present

## 2022-01-28 DIAGNOSIS — L853 Xerosis cutis: Secondary | ICD-10-CM | POA: Diagnosis not present

## 2022-01-28 MED ORDER — AMMONIUM LACTATE 12 % EX LOTN: 1.0000 | TOPICAL_LOTION | CUTANEOUS | 0 refills | Status: DC | PRN

## 2022-01-28 NOTE — Progress Notes (Signed)
Subjective: Molly Klein presents today referred by [name], DO for diabetic foot evaluation.  Patient relates few year history of diabetes.  Patient denies any history of foot wounds.  Patient denies any history of numbness, tingling, burning, pins/needles sensations.  Past Medical History:  Diagnosis Date   Asthma    Bruising 10/21/2012   Cervical spinal stenosis    L4-5   DVT (deep venous thrombosis) (HCC)    GERD (gastroesophageal reflux disease)    Hepatitis C    Treated and cured per pt   History of DVT of lower extremity 10/21/2012   Hypertension    Knee pain    left    Neuromuscular disorder (HCC)    neuropathy lt arm   Nipple discharge    right breast   OSA (obstructive sleep apnea)     Patient Active Problem List   Diagnosis Date Noted   Chronic respiratory failure with hypoxia ([place]) 01/09/2022   Deep venous thrombosis ([place]) 10/12/2020   Vaginal discharge 10/12/2020   Viral hepatitis C 10/12/2020   Pharyngitis 09/06/2020   Type 2 diabetes mellitus with hyperglycemia, without long-term current use of insulin ([place]) 03/28/2020   Chronic hepatitis ([place]) 03/28/2020   Hyperlipidemia associated with type 2 diabetes mellitus ([place]) 03/28/2020   Cervical spinal stenosis 07/20/2018   Spinal stenosis of cervical region 06/14/2018   Pre-operative cardiovascular examination 04/07/2018   Sleep apnea 04/07/2018   Morbid obesity with body mass index (BMI) of 40.0 to 49.9 ([place]) 04/07/2018   Grief counseling 03/17/2018   Pre-op evaluation 03/16/2018   Protrusion of cervical intervertebral disc 01/10/2018   Right wrist injury 08/26/2014   Left knee pain 08/26/2014   Severe obesity (BMI >= 40) ([place]) 03/12/2014   Popliteal pain 12/20/2013   Contusion of left calf 12/13/2013   Acute bronchitis with asthma with acute exacerbation 05/15/2013   Bruising 10/21/2012   History of DVT of lower extremity 10/21/2012   Acute thoracic back pain 09/28/2012   Sinus  congestion 04/01/2012   Solitary pulmonary nodule 17/51/0258   Lichenoid keratosis 52/77/8242   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[place]) 07/07/2011   Nipple discharge in female, right 02/04/2011   Essential hypertension 02/06/2010   STRESS INCONTINENCE 04/22/2009   UTI 10/24/2008   PATELLAR DISLOCATION, RIGHT 10/24/2008   HEPATITIS C 10/08/2006   SLEEP APNEA, OBSTRUCTIVE 10/08/2006   NARCOLEPSY W/O CATAPLEXY 10/08/2006   Mild persistent asthma 10/08/2006   GERD 10/08/2006   OVERACTIVE BLADDER 10/08/2006   FIBROCYSTIC BREAST DISEASE 10/08/2006    Past Surgical History:  Procedure Laterality Date   ABDOMINAL HYSTERECTOMY     ANTERIOR CERVICAL DECOMP/DISCECTOMY FUSION N/A 07/20/2018   Procedure: c4-5ANTERIOR CERVICAL DECOMPRESSION/DISCECTOMY FUSION, ALLOGRAFT & PLATE;  Surgeon: Marybelle Killings, MD;  Location: [facility];  Service: Orthopedics;  Laterality: N/A;   BREAST BIOPSY  02/06/2011   Procedure: BREAST BIOPSY;  Surgeon: Earnstine Regal, MD;  Location: [facility];  Service: General;  Laterality: Right;  right breast biopsy   CHOLECYSTECTOMY     DILATION AND CURETTAGE OF UTERUS     FRACTURE SURGERY     fx lt arm   Nipple repair     PILONIDAL CYST EXCISION     right knee surgery     TONSILLECTOMY      Current Outpatient Medications on File Prior to Visit  Medication Sig Dispense Refill   albuterol (PROVENTIL HFA;VENTOLIN HFA) 108 (90 Base) MCG/ACT inhaler Inhale 2 puffs into the lungs 4 (four)  times daily as needed. Shortness of breath (Patient taking differently: Inhale 2 puffs into the lungs 4 (four) times daily as needed for wheezing or shortness of breath.) 1 Inhaler 6   albuterol (PROVENTIL) (2.5 MG/3ML) 0.083% nebulizer solution INHALE 1 VIAL BY NEBULIZER AS DIRECTED (Patient taking differently: Take 2.5 mg by nebulization 4 (four) times daily as needed for wheezing or shortness of breath.) 75 mL 0   Blood Glucose Monitoring Suppl (ONETOUCH VERIO FLEX SYSTEM) w/Device KIT Check  blood sugar up to 4 times a day. Dx Code: E11.65 1 kit 0   COVID-19 mRNA vaccine 2023-2024 (COMIRNATY) syringe Inject into the muscle. 0.3 mL 0   glucose blood (ONETOUCH VERIO) test strip Check blood sugar up to 4 times a day. Dx Code: E11.65 400 each 1   hydrochlorothiazide (HYDRODIURIL) 25 MG tablet TAKE 1 TABLET(25 MG) BY MOUTH DAILY 90 tablet 1   Lancets (ONETOUCH DELICA PLUS [id number]) MISC Check blood sugar up to 4 times a day. Dx Code: E11.65 400 each 1   lisinopril (ZESTRIL) 5 MG tablet Take 1 tablet (5 mg total) by mouth daily. 90 tablet 3   metFORMIN (GLUCOPHAGE) 500 MG tablet TAKE 1 TABLET(500 MG) BY MOUTH TWICE DAILY WITH A MEAL 90 tablet 1   Polyethyl Glycol-Propyl Glycol (SYSTANE OP) Apply to eye.     rosuvastatin (CRESTOR) 10 MG tablet TAKE 1 TABLET(10 MG) BY MOUTH DAILY 90 tablet 3   Semaglutide, 2 MG/DOSE, 8 MG/3ML SOPN Inject 2 mg as directed once a week. 3 mL 3   valACYclovir (VALTREX) 1000 MG tablet TAKE 1 TABLET BY MOUTH THREE TIMES DAILY FOR 7 DAYS THEN TAKE 1 TABLET BY MOUTH EVERY DAY 42 tablet 5   No current facility-administered medications on file prior to visit.     Allergies  Allergen Reactions   Codeine Nausea And Vomiting   Interferon Beta-1a Nausea And Vomiting and Other (See Comments)    Severe flu-like symptoms Symptoms expected   Sulfonamide Derivatives Nausea Only    Social History   Occupational History   Occupation: retired    Fish farm manager: DISABLED  Tobacco Use   Smoking status: Former    Years: 5.00    Types: Cigarettes    Quit date: 02/03/1975    Years since quitting: 47.0   Smokeless tobacco: Never   Tobacco comments:    1 pack per week  Vaping Use   Vaping Use: Never used  Substance and Sexual Activity   Alcohol use: No    Alcohol/week: 0.0 standard drinks of alcohol   Drug use: No   Sexual activity: Yes    Partners: Male    Family History  Problem Relation Age of Onset   Heart attack Mother        mi in 83s   Alcohol abuse  Mother        cirrhosis of liver   Heart attack Father        MI in 63s   Rectal cancer Maternal Grandmother    Cancer Maternal Grandmother        breast   Cancer Sister        pt unaware of what kind    Immunization History  Administered Date(s) Administered   COVID-19, mRNA, vaccine(Comirnaty)12 years and older 01/09/2022   Influenza Split 10/22/2011   Influenza,inj,Quad PF,6+ Mos 11/02/2013, 01/09/2022   PFIZER Comirnaty([place])Covid-19 Tri-Sucrose Vaccine 03/23/2019, 05/15/2019, 11/21/2019, 05/30/2020   PNEUMOCOCCAL CONJUGATE-20 06/25/2020   Pfizer Covid-19 Vaccine Bivalent Booster 84yr & up  01/06/2021   Td 10/01/2003   Tdap 06/25/2020    Review of systems: Positive Findings in bold print.  Constitutional:  chills, fatigue, fever, sweats, weight change Communication: Optometrist, sign Ecologist, hand writing, iPad/Android device Head: headaches, head injury Eyes: changes in vision, eye pain, glaucoma, cataracts, macular degeneration, diplopia, glare,  light sensitivity, eyeglasses or contacts, blindness Ears nose mouth throat: hearing impaired, hearing aids,  ringing in ears, deaf, sign language,  vertigo, nosebleeds,  rhinitis,  cold sores, snoring, swollen glands Cardiovascular: HTN, edema, arrhythmia, pacemaker in place, defibrillator in place, chest pain/tightness, chronic anticoagulation, blood clot, heart failure, MI Peripheral Vascular: leg cramps, varicose veins, blood clots, lymphedema, varicosities Respiratory:  asthma, difficulty breathing, denies congestion, SOB, wheezing, cough, emphysema Gastrointestinal: change in appetite or weight, abdominal pain, constipation, diarrhea, nausea, vomiting, vomiting blood, change in bowel habits, abdominal pain, jaundice, rectal bleeding, hemorrhoids, GERD Genitourinary:  nocturia,  pain on urination, polyuria,  blood in urine, Foley catheter, urinary urgency, ESRD on hemodialysis Musculoskeletal: amputation,  cramping, stiff joints, painful joints, decreased joint motion, fractures, OA, gout, hemiplegia, paraplegia, uses cane, wheelchair bound, uses walker, uses rollator Skin: +changes in toenails, color change, dryness, itching, mole changes,  rash, wound(s) Neurological: headaches, numbness in feet, paresthesias in feet, burning in feet, fainting,  seizures, change in speech, migraines, memory problems/poor historian, cerebral palsy, weakness, paralysis, CVA, TIA Endocrine: diabetes, hypothyroidism, hyperthyroidism,  goiter, dry mouth, flushing, heat intolerance, cold intolerance,  excessive thirst, denies polyuria,  nocturia Hematological:  easy bleeding, excessive bleeding, easy bruising, enlarged lymph nodes, on long term blood thinner, history of past transusions Allergy/immunological:  hives, eczema, frequent infections, multiple drug allergies, seasonal allergies, transplant recipient, multiple food allergies Psychiatric:  anxiety, depression, mood disorder, suicidal ideations, hallucinations, insomnia  Objective: There were no vitals filed for this visit. Vascular Examination: Capillary refill time less than 3 seconds x 10 digits.  Dorsalis pedis pulses palpable 2 out of 4.  Posterior tibial pulses palpable 2 out of 4.  Digital hair not present x 10 digits.  Skin temperature gradient WNL b/l.  Dermatological Examination: Skin with normal turgor, texture and tone b/l  Toenails 1-5 b/l discolored, thick, dystrophic with subungual debris and pain with palpation to nailbeds due to thickness of nails.  Dry skin noted to both heel with superficial skin fissures.  No open wounds or lesion noted  Musculoskeletal: Muscle strength 5/5 to all LE muscle groups.  Neurological: Sensation intact with 10 gram monofilament.  Vibratory sensation intact.  Assessment: NIDDM Encounter for diabetic foot examination Xerosis skin  Plan: Discussed diabetic foot care principles. Literature  dispensed on today. Patient to continue soft, supportive shoe gear daily. Patient to report any pedal injuries to medical professional immediately. Follow up one year. Patient/POA to call should there be a concern in the interim. Given the presence of xerosis and enough setting of failed over-the-counter medication she will benefit from ammonium lactate ammonium lactate was sent to the pharmacy

## 2022-02-19 ENCOUNTER — Ambulatory Visit: Payer: Medicare HMO | Admitting: Family Medicine

## 2022-02-19 ENCOUNTER — Encounter: Payer: Self-pay | Admitting: Family Medicine

## 2022-02-19 VITALS — BP 130/70 | HR 86 | Temp 98.1°F | Resp 18 | Ht 63.0 in | Wt 249.0 lb

## 2022-02-19 DIAGNOSIS — R051 Acute cough: Secondary | ICD-10-CM | POA: Diagnosis not present

## 2022-02-19 DIAGNOSIS — J0140 Acute pansinusitis, unspecified: Secondary | ICD-10-CM

## 2022-02-19 DIAGNOSIS — R0981 Nasal congestion: Secondary | ICD-10-CM

## 2022-02-19 LAB — POCT RAPID STREP A (OFFICE): Rapid Strep A Screen: NEGATIVE

## 2022-02-19 LAB — POC INFLUENZA A&B (BINAX/QUICKVUE)
Influenza A, POC: NEGATIVE
Influenza B, POC: NEGATIVE

## 2022-02-19 LAB — POC COVID19 BINAXNOW: SARS Coronavirus 2 Ag: NEGATIVE

## 2022-02-19 MED ORDER — FLUTICASONE PROPIONATE 50 MCG/ACT NA SUSP: 2.0000 | Freq: Every day | NASAL | 6 refills | Status: AC

## 2022-02-19 MED ORDER — AMOXICILLIN-POT CLAVULANATE 875-125 MG PO TABS: 1.0000 | ORAL_TABLET | Freq: Two times a day (BID) | ORAL | 0 refills | Status: DC

## 2022-02-19 NOTE — Progress Notes (Addendum)
Subjective:   By signing my name below, I, [name], attest that this documentation has been prepared under the direction and in the presence of [name], DO. 02/19/2022   Patient ID: Molly Klein, female    DOB: Oct 08, 1960, 62 y.o.   MRN: [id number]  Chief Complaint  Patient presents with   Cough    Sxs started Friday, Pt states having congestion, runny nose, facial pressure, productive cough. Pt states taking some mucinex.     Cough Associated symptoms include a fever and a sore throat. Pertinent negatives include no chest pain, chills, headaches, heartburn, myalgias, rash, shortness of breath or wheezing. There is no history of environmental allergies.   Patient is in today for a office visit.   She complains of cough, sore throat, nasal congestion, and fever. She developed cough and nasal drainage on 02/13/2021. She rest until Tuesday when she developed a fever. She had body aches but they have resolved at this time. She denies having any wheezing. She is UTD on the latest Covid-19 booster vaccine and flu vaccine. 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   Past Medical History:  Diagnosis Date   Asthma    Bruising 10/21/2012   Cervical spinal stenosis    L4-5   DVT (deep venous thrombosis) (HCC)    GERD (gastroesophageal reflux disease)    Hepatitis C    Treated and cured per pt   History of DVT of lower extremity 10/21/2012   Hypertension    Knee pain    left    Neuromuscular disorder (HCC)    neuropathy lt arm   Nipple discharge    right breast   OSA (obstructive sleep apnea)     Past Surgical History:  Procedure Laterality Date   ABDOMINAL HYSTERECTOMY     ANTERIOR CERVICAL DECOMP/DISCECTOMY FUSION N/A 07/20/2018   Procedure: c4-5ANTERIOR CERVICAL DECOMPRESSION/DISCECTOMY FUSION, ALLOGRAFT & PLATE;  Surgeon: Marybelle Killings, MD;  Location: [facility];  Service: Orthopedics;  Laterality: N/A;   BREAST BIOPSY  02/06/2011    Procedure: BREAST BIOPSY;  Surgeon: Earnstine Regal, MD;  Location: [facility];  Service: General;  Laterality: Right;  right breast biopsy   CHOLECYSTECTOMY     DILATION AND CURETTAGE OF UTERUS     FRACTURE SURGERY     fx lt arm   Nipple repair     PILONIDAL CYST EXCISION     right knee surgery     TONSILLECTOMY      Family History  Problem Relation Age of Onset   Heart attack Mother        mi in 74s   Alcohol abuse Mother        cirrhosis of liver   Heart attack Father        MI in 15s   Rectal cancer Maternal Grandmother    Cancer Maternal Grandmother        breast   Cancer Sister        pt unaware of what kind    Social History   Socioeconomic History   Marital status: Widowed    Spouse name: Not on file   Number of children: 0   Years of education: Not on file   Highest education level: Not on file  Occupational History   Occupation: retired    Fish farm manager: DISABLED  Tobacco Use   Smoking status: Former    Years: 5.00    Types: Cigarettes  Quit date: 02/03/1975    Years since quitting: 47.0   Smokeless tobacco: Never   Tobacco comments:    1 pack per week  Vaping Use   Vaping Use: Never used  Substance and Sexual Activity   Alcohol use: No    Alcohol/week: 0.0 standard drinks of alcohol   Drug use: No   Sexual activity: Yes    Partners: Male  Other Topics Concern   Not on file  Social History Narrative   No children - lives alone   Recovering alcoholic   Enjoys gambling, traveling and riding motorcycles   Sister lives nearby - very close   Social Determinants of Health   Financial Resource Strain: Low Risk  (10/12/2020)   Overall Financial Resource Strain (CARDIA)    Difficulty of Paying Living Expenses: Not hard at all  Food Insecurity: No Food Insecurity (10/12/2020)   Hunger Vital Sign    Worried About Running Out of Food in the Last Year: Never true    [place] in the Last Year: Never true  Transportation Needs: No  Transportation Needs (10/12/2020)   PRAPARE - Hydrologist (Medical): No    Lack of Transportation (Non-Medical): No  Physical Activity: Insufficiently Active (10/12/2020)   Exercise Vital Sign    Days of Exercise per Week: 7 days    Minutes of Exercise per Session: 10 min  Stress: No Stress Concern Present (10/12/2020)   [place]    Feeling of Stress : Not at all  Social Connections: Socially Isolated (10/12/2020)   Social Connection and Isolation Panel [NHANES]    Frequency of Communication with Friends and Family: More than three times a week    Frequency of Social Gatherings with Friends and Family: More than three times a week    Attends Religious Services: Never    Marine scientist or Organizations: No    Attends Archivist Meetings: Never    Marital Status: Widowed  Intimate Partner Violence: Not At Risk (10/12/2020)   Humiliation, Afraid, Rape, and Kick questionnaire    Fear of Current or Ex-Partner: No    Emotionally Abused: No    Physically Abused: No    Sexually Abused: No    Outpatient Medications Prior to Visit  Medication Sig Dispense Refill   albuterol (PROVENTIL HFA;VENTOLIN HFA) 108 (90 Base) MCG/ACT inhaler Inhale 2 puffs into the lungs 4 (four) times daily as needed. Shortness of breath (Patient taking differently: Inhale 2 puffs into the lungs 4 (four) times daily as needed for wheezing or shortness of breath.) 1 Inhaler 6   albuterol (PROVENTIL) (2.5 MG/3ML) 0.083% nebulizer solution INHALE 1 VIAL BY NEBULIZER AS DIRECTED (Patient taking differently: Take 2.5 mg by nebulization 4 (four) times daily as needed for wheezing or shortness of breath.) 75 mL 0   ammonium lactate (AMLACTIN) 12 % lotion Apply 1 Application topically as needed for dry skin. 400 g 0   Blood Glucose Monitoring Suppl ([place]) w/Device KIT Check blood sugar up to 4  times a day. Dx Code: E11.65 1 kit 0   glucose blood (ONETOUCH VERIO) test strip Check blood sugar up to 4 times a day. Dx Code: E11.65 400 each 1   hydrochlorothiazide (HYDRODIURIL) 25 MG tablet TAKE 1 TABLET(25 MG) BY MOUTH DAILY 90 tablet 1   Lancets (ONETOUCH DELICA PLUS [id number]) MISC Check blood sugar up to 4 times a day.  Dx Code: E11.65 400 each 1   lisinopril (ZESTRIL) 5 MG tablet Take 1 tablet (5 mg total) by mouth daily. 90 tablet 3   metFORMIN (GLUCOPHAGE) 500 MG tablet TAKE 1 TABLET(500 MG) BY MOUTH TWICE DAILY WITH A MEAL 90 tablet 1   Polyethyl Glycol-Propyl Glycol (SYSTANE OP) Apply to eye.     rosuvastatin (CRESTOR) 10 MG tablet TAKE 1 TABLET(10 MG) BY MOUTH DAILY 90 tablet 3   Semaglutide, 2 MG/DOSE, 8 MG/3ML SOPN Inject 2 mg as directed once a week. 3 mL 3   valACYclovir (VALTREX) 1000 MG tablet TAKE 1 TABLET BY MOUTH THREE TIMES DAILY FOR 7 DAYS THEN TAKE 1 TABLET BY MOUTH EVERY DAY 42 tablet 5   COVID-19 mRNA vaccine 2023-2024 (COMIRNATY) syringe Inject into the muscle. (Patient not taking: Reported on 02/19/2022) 0.3 mL 0   No facility-administered medications prior to visit.    Allergies  Allergen Reactions   Codeine Nausea And Vomiting   Interferon Beta-1a Nausea And Vomiting and Other (See Comments)    Severe flu-like symptoms Symptoms expected   Sulfonamide Derivatives Nausea Only    Review of Systems  Constitutional:  Positive for fever. Negative for chills and malaise/fatigue.  HENT:  Positive for congestion and sore throat. Negative for hearing loss.   Eyes:  Negative for discharge.  Respiratory:  Positive for cough. Negative for sputum production, shortness of breath and wheezing.   Cardiovascular:  Negative for chest pain, palpitations and leg swelling.  Gastrointestinal:  Negative for abdominal pain, blood in stool, constipation, diarrhea, heartburn, nausea and vomiting.  Genitourinary:  Negative for dysuria, frequency, hematuria and urgency.   Musculoskeletal:  Negative for back pain, falls and myalgias.  Skin:  Negative for rash.  Neurological:  Negative for dizziness, sensory change, loss of consciousness, weakness and headaches.  Endo/Heme/Allergies:  Negative for environmental allergies. Does not bruise/bleed easily.  Psychiatric/Behavioral:  Negative for depression and suicidal ideas. The patient is not nervous/anxious and does not have insomnia.        Objective:    Physical Exam Vitals and nursing note reviewed.  Constitutional:      General: She is not in acute distress.    Appearance: Normal appearance. She is not ill-appearing.  HENT:     Head: Normocephalic and atraumatic.     Right Ear: Tympanic membrane, ear canal and external ear normal.     Left Ear: Tympanic membrane, ear canal and external ear normal.     Mouth/Throat:     Mouth: Mucous membranes are moist.     Pharynx: Oropharynx is clear. Posterior oropharyngeal erythema (mild) present.  Eyes:     Extraocular Movements: Extraocular movements intact.     Pupils: Pupils are equal, round, and reactive to light.  Cardiovascular:     Rate and Rhythm: Normal rate and regular rhythm.     Heart sounds: Normal heart sounds. No murmur heard.    No gallop.  Pulmonary:     Effort: Pulmonary effort is normal. No respiratory distress.     Breath sounds: Normal breath sounds. No wheezing or rales.  Skin:    General: Skin is warm and dry.  Neurological:     Mental Status: She is alert and oriented to person, place, and time.  Psychiatric:        Judgment: Judgment normal.     BP 130/70 (BP Location: Left Arm, Patient Position: Sitting, Cuff Size: Large)   Pulse 86   Temp 98.1 F (36.7 C) (Oral)  Resp 18   Ht '5\' 3"'$  (1.6 m)   Wt 249 lb (112.9 kg)   SpO2 96%   BMI 44.11 kg/m  Wt Readings from Last 3 Encounters:  02/19/22 249 lb (112.9 kg)  01/09/22 249 lb (112.9 kg)  07/23/21 262 lb 3.2 oz (118.9 kg)       Assessment & Plan:  Acute  non-recurrent pansinusitis -     Amoxicillin-Pot Clavulanate; Take 1 tablet by mouth 2 (two) times daily.  Dispense: 20 tablet; Refill: 0 -     Fluticasone Propionate; Place 2 sprays into both nostrils daily.  Dispense: 16 g; Refill: 6  Acute cough -     POC Influenza A&B(BINAX/QUICKVUE) -     POCT rapid strep A -     POC COVID-19 BinaxNow  Sinus congestion Assessment & Plan: Abx per orders  Flonase  Neg flu / covid and strep     I, [name], DO, personally preformed the services described in this documentation.  All medical record entries made by the scribe were at my direction and in my presence.  I have reviewed the chart and discharge instructions (if applicable) and agree that the record reflects my personal performance and is accurate and complete. 02/19/2022   I,[name],acting as a scribe for [name], DO.,have documented all relevant documentation on the behalf of [name], DO,as directed by  [name], DO while in the presence of [name], DO.   [name], DO

## 2022-02-19 NOTE — Patient Instructions (Signed)

## 2022-02-20 DIAGNOSIS — J0140 Acute pansinusitis, unspecified: Secondary | ICD-10-CM | POA: Insufficient documentation

## 2022-02-20 NOTE — Assessment & Plan Note (Signed)
Abx per orders  Flonase  Neg flu / covid and strep

## 2022-03-06 ENCOUNTER — Telehealth: Payer: Self-pay

## 2022-03-06 NOTE — Telephone Encounter (Signed)
ERROR--TO CLOSE 

## 2022-03-23 ENCOUNTER — Other Ambulatory Visit: Payer: Self-pay | Admitting: Family Medicine

## 2022-03-23 DIAGNOSIS — E1165 Type 2 diabetes mellitus with hyperglycemia: Secondary | ICD-10-CM

## 2022-04-29 ENCOUNTER — Ambulatory Visit: Payer: Medicare HMO | Admitting: Podiatry

## 2022-04-29 LAB — HM DIABETES EYE EXAM

## 2022-05-13 ENCOUNTER — Encounter: Payer: Self-pay | Admitting: Podiatry

## 2022-05-13 ENCOUNTER — Ambulatory Visit: Payer: Medicare HMO | Admitting: Podiatry

## 2022-05-13 DIAGNOSIS — E1165 Type 2 diabetes mellitus with hyperglycemia: Secondary | ICD-10-CM

## 2022-05-13 DIAGNOSIS — M79674 Pain in right toe(s): Secondary | ICD-10-CM

## 2022-05-13 DIAGNOSIS — B351 Tinea unguium: Secondary | ICD-10-CM

## 2022-05-13 DIAGNOSIS — R234 Changes in skin texture: Secondary | ICD-10-CM | POA: Diagnosis not present

## 2022-05-13 DIAGNOSIS — M79675 Pain in left toe(s): Secondary | ICD-10-CM | POA: Diagnosis not present

## 2022-05-13 NOTE — Progress Notes (Signed)
This patient returns to my office for at risk foot care.  This patient requires this care by a professional since this patient will be at risk due to having diabetes.This patient is unable to cut nails himself since the patient cannot reach his nails.These nails are painful walking and wearing shoes. She also has painful fissures right heel. This patient presents for at risk foot care today.  General Appearance  Alert, conversant and in no acute stress.  Vascular  Dorsalis pedis and posterior tibial  pulses are palpable  bilaterally.  Capillary return is within normal limits  bilaterally. Temperature is within normal limits  bilaterally.  Neurologic  Senn-Weinstein monofilament wire test within normal limits  bilaterally. Muscle power within normal limits bilaterally.  Nails Thick disfigured discolored nails with subungual debris  from hallux to fifth toes bilaterally. No evidence of bacterial infection or drainage bilaterally.  Orthopedic  No limitations of motion  feet .  No crepitus or effusions noted.  No bony pathology or digital deformities noted.  Skin  normotropic skin with no porokeratosis noted bilaterally.  No signs of infections or ulcers noted.   Fissures right heel.  Onychomycosis  Pain in right toes  Pain in left toes  Fissures  Heel  Consent was obtained for treatment procedures.   Mechanical debridement of nails 1-5  bilaterally performed with a nail nipper.  Filed with dremel without incident. Discussed her fissures and recommend vaseline and chapstick.   Return office visit   3 months                   Told patient to return for periodic foot care and evaluation due to potential at risk complications.   Helane Gunther DPM

## 2022-06-22 ENCOUNTER — Telehealth: Payer: Self-pay | Admitting: Family Medicine

## 2022-06-22 NOTE — Telephone Encounter (Signed)
Pt dropped off document to be filled out by provider (Disability Parking Placard 1 page) Pt would like to be called when document ready to pick up. Document put at front office tray under providers name.

## 2022-06-23 NOTE — Telephone Encounter (Signed)
Received. Placed in folder 

## 2022-07-01 NOTE — Telephone Encounter (Signed)
Forms completed. Form placed at the front for pickup Pt made aware.

## 2022-07-14 ENCOUNTER — Encounter: Payer: Medicare HMO | Admitting: Family Medicine

## 2022-07-30 ENCOUNTER — Encounter: Payer: Medicare HMO | Admitting: Family Medicine

## 2022-08-03 ENCOUNTER — Encounter: Payer: Self-pay | Admitting: Family Medicine

## 2022-08-03 ENCOUNTER — Ambulatory Visit (INDEPENDENT_AMBULATORY_CARE_PROVIDER_SITE_OTHER): Payer: Medicare HMO | Admitting: Family Medicine

## 2022-08-03 ENCOUNTER — Other Ambulatory Visit (HOSPITAL_BASED_OUTPATIENT_CLINIC_OR_DEPARTMENT_OTHER): Payer: Self-pay | Admitting: Family Medicine

## 2022-08-03 VITALS — BP 130/80 | HR 88 | Temp 97.7°F | Ht 63.0 in | Wt 251.4 lb

## 2022-08-03 DIAGNOSIS — Z1211 Encounter for screening for malignant neoplasm of colon: Secondary | ICD-10-CM

## 2022-08-03 DIAGNOSIS — Z7984 Long term (current) use of oral hypoglycemic drugs: Secondary | ICD-10-CM

## 2022-08-03 DIAGNOSIS — E785 Hyperlipidemia, unspecified: Secondary | ICD-10-CM | POA: Diagnosis not present

## 2022-08-03 DIAGNOSIS — Z0000 Encounter for general adult medical examination without abnormal findings: Secondary | ICD-10-CM

## 2022-08-03 DIAGNOSIS — E1169 Type 2 diabetes mellitus with other specified complication: Secondary | ICD-10-CM

## 2022-08-03 DIAGNOSIS — E1165 Type 2 diabetes mellitus with hyperglycemia: Secondary | ICD-10-CM | POA: Diagnosis not present

## 2022-08-03 DIAGNOSIS — I10 Essential (primary) hypertension: Secondary | ICD-10-CM

## 2022-08-03 DIAGNOSIS — Z1231 Encounter for screening mammogram for malignant neoplasm of breast: Secondary | ICD-10-CM

## 2022-08-03 LAB — COMPREHENSIVE METABOLIC PANEL
ALT: 23 U/L (ref 0–35)
AST: 14 U/L (ref 0–37)
Albumin: 4.1 g/dL (ref 3.5–5.2)
Alkaline Phosphatase: 67 U/L (ref 39–117)
BUN: 16 mg/dL (ref 6–23)
CO2: 26 mEq/L (ref 19–32)
Calcium: 9.5 mg/dL (ref 8.4–10.5)
Chloride: 103 mEq/L (ref 96–112)
Creatinine, Ser: 0.82 mg/dL (ref 0.40–1.20)
GFR: 75.7 mL/min (ref >60.00)
Glucose, Bld: 153 mg/dL — ABNORMAL HIGH (ref 70–99)
Potassium: 4 mEq/L (ref 3.5–5.1)
Sodium: 138 mEq/L (ref 135–145)
Total Bilirubin: 0.5 mg/dL (ref 0.2–1.2)
Total Protein: 7.2 g/dL (ref 6.0–8.3)

## 2022-08-03 LAB — CBC WITH DIFFERENTIAL/PLATELET
Basophils Absolute: 0 10*3/uL (ref 0.0–0.1)
Basophils Relative: 1.6 % (ref 0.0–3.0)
Eosinophils Absolute: 0.1 10*3/uL (ref 0.0–0.7)
Eosinophils Relative: 3.7 % (ref 0.0–5.0)
HCT: 43.1 % (ref 36.0–46.0)
Hemoglobin: 14.7 g/dL (ref 12.0–15.0)
Lymphocytes Relative: 35.9 % (ref 12.0–46.0)
Lymphs Abs: 1 10*3/uL (ref 0.7–4.0)
MCHC: 34.1 g/dL (ref 30.0–36.0)
MCV: 88.9 fl (ref 78.0–100.0)
Monocytes Absolute: 0.1 10*3/uL (ref 0.1–1.0)
Monocytes Relative: 5.3 % (ref 3.0–12.0)
Neutro Abs: 1.5 10*3/uL (ref 1.4–7.7)
Neutrophils Relative %: 53.5 % (ref 43.0–77.0)
Platelets: 206 10*3/uL (ref 150.0–400.0)
RBC: 4.85 Mil/uL (ref 3.87–5.11)
RDW: 14.7 % (ref 11.5–15.5)
WBC: 2.8 10*3/uL — ABNORMAL LOW (ref 4.0–10.5)

## 2022-08-03 LAB — LIPID PANEL
Cholesterol: 171 mg/dL (ref 0–200)
HDL: 46.9 mg/dL (ref >39.00)
LDL Cholesterol: 106 mg/dL — ABNORMAL HIGH (ref 0–99)
NonHDL: 124.25
Total CHOL/HDL Ratio: 4
Triglycerides: 90 mg/dL (ref 0.0–149.0)
VLDL: 18 mg/dL (ref 0.0–40.0)

## 2022-08-03 LAB — TSH: TSH: 1.87 u[IU]/mL (ref 0.35–5.50)

## 2022-08-03 LAB — HEMOGLOBIN A1C: Hgb A1c MFr Bld: 5.5 % (ref 4.6–6.5)

## 2022-08-03 NOTE — Assessment & Plan Note (Signed)
Well controlled, no changes to meds. Encouraged heart healthy diet such as the DASH diet and exercise as tolerated.  °

## 2022-08-03 NOTE — Assessment & Plan Note (Signed)
Encourage heart healthy diet such as MIND or DASH diet, increase exercise, avoid trans fats, simple carbohydrates and processed foods, consider a krill or fish or flaxseed oil cap daily.  °

## 2022-08-03 NOTE — Assessment & Plan Note (Signed)
Per endo °

## 2022-08-03 NOTE — Progress Notes (Signed)
Established Patient Office Visit  Subjective   Patient ID: Molly Klein, female    DOB: 02-Jan-1961  Age: 62 y.o. MRN: [id number]  Chief Complaint  Patient presents with   Annual Exam    Pt states not fasting     HPI Discussed the use of AI scribe software for clinical note transcription with the patient, who gave verbal consent to proceed.  History of Present Illness   The patient presents for a routine physical. She reports a weight gain of two pounds, but attributes this to clothing. She has been adhering to a diet consisting of boiled eggs, yogurt, and coffee for breakfast, leftovers for lunch, and salads for dinner. She also consumes granola bars and drinks water throughout the day. She reports that this diet, along with the consumption of Activia and increased water intake, has helped alleviate previous issues with constipation.  The patient also mentions a sleep disorder, for which she consumes decaffeinated coffee. She has a history of constipation, which has improved with dietary changes. She also reports a family history of heart disease, with a niece who recently had a heart attack and has a pacemaker.  The patient is also on Ozempic, which she mentions can cause constipation. She has been advised to take a stool softener daily to help with this. She has not been to the dentist recently, but does regularly see an eye doctor and a dermatologist.      Patient Active Problem List   Diagnosis Date Noted   Skin fissures 05/13/2022   Acute non-recurrent pansinusitis 02/20/2022   Chronic respiratory failure with hypoxia (HCC) 01/09/2022   Deep venous thrombosis (HCC) 10/12/2020   Vaginal discharge 10/12/2020   Viral hepatitis C 10/12/2020   Pharyngitis 09/06/2020   Type 2 diabetes mellitus with hyperglycemia, without long-term current use of insulin (HCC) 03/28/2020   Chronic hepatitis (HCC) 03/28/2020   Hyperlipidemia associated with type 2 diabetes mellitus (HCC)  03/28/2020   Cervical spinal stenosis 07/20/2018   Spinal stenosis of cervical region 06/14/2018   Colon cancer screening 04/07/2018   Sleep apnea 04/07/2018   Morbid obesity with body mass index (BMI) of 40.0 to 49.9 (HCC) 04/07/2018   Grief counseling 03/17/2018   Pre-op evaluation 03/16/2018   Protrusion of cervical intervertebral disc 01/10/2018   Right wrist injury 08/26/2014   Left knee pain 08/26/2014   Severe obesity (BMI >= 40) (HCC) 03/12/2014   Popliteal pain 12/20/2013   Contusion of left calf 12/13/2013   Acute bronchitis with asthma with acute exacerbation 05/15/2013   Bruising 10/21/2012   History of DVT of lower extremity 10/21/2012   Acute thoracic back pain 09/28/2012   Sinus congestion 04/01/2012   Solitary pulmonary nodule 10/07/2011   Lichenoid keratosis 10/06/2011   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   Nipple discharge in female, right 02/04/2011   Essential hypertension 02/06/2010   STRESS INCONTINENCE 04/22/2009   UTI 10/24/2008   PATELLAR DISLOCATION, RIGHT 10/24/2008   HEPATITIS C 10/08/2006   SLEEP APNEA, OBSTRUCTIVE 10/08/2006   NARCOLEPSY W/O CATAPLEXY 10/08/2006   Mild persistent asthma 10/08/2006   GERD 10/08/2006   OVERACTIVE BLADDER 10/08/2006   FIBROCYSTIC BREAST DISEASE 10/08/2006   Past Medical History:  Diagnosis Date   Asthma    Bruising 10/21/2012   Cervical spinal stenosis    L4-5   DVT (deep venous thrombosis) (HCC)    GERD (gastroesophageal reflux disease)    Hepatitis C    Treated and cured per pt  History of DVT of lower extremity 10/21/2012   Hypertension    Knee pain    left    Neuromuscular disorder (HCC)    neuropathy lt arm   Nipple discharge    right breast   OSA (obstructive sleep apnea)    Past Surgical History:  Procedure Laterality Date   ABDOMINAL HYSTERECTOMY     ANTERIOR CERVICAL DECOMP/DISCECTOMY FUSION N/A 07/20/2018   Procedure: c4-5ANTERIOR CERVICAL DECOMPRESSION/DISCECTOMY FUSION, ALLOGRAFT &  PLATE;  Surgeon: Eldred Manges, MD;  Location: [facility] OR;  Service: Orthopedics;  Laterality: N/A;   BREAST BIOPSY  02/06/2011   Procedure: BREAST BIOPSY;  Surgeon: Velora Heckler, MD;  Location: [facility];  Service: General;  Laterality: Right;  right breast biopsy   CHOLECYSTECTOMY     DILATION AND CURETTAGE OF UTERUS     FRACTURE SURGERY     fx lt arm   Nipple repair     PILONIDAL CYST EXCISION     right knee surgery     TONSILLECTOMY     Social History   Tobacco Use   Smoking status: Former    Years: 5    Types: Cigarettes    Quit date: 02/03/1975    Years since quitting: 47.5   Smokeless tobacco: Never   Tobacco comments:    1 pack per week  Vaping Use   Vaping Use: Never used  Substance Use Topics   Alcohol use: No    Alcohol/week: 0.0 standard drinks of alcohol   Drug use: No   Social History   Socioeconomic History   Marital status: Widowed    Spouse name: Not on file   Number of children: 0   Years of education: Not on file   Highest education level: Not on file  Occupational History   Occupation: retired    Associate Professor: DISABLED  Tobacco Use   Smoking status: Former    Years: 5    Types: Cigarettes    Quit date: 02/03/1975    Years since quitting: 47.5   Smokeless tobacco: Never   Tobacco comments:    1 pack per week  Vaping Use   Vaping Use: Never used  Substance and Sexual Activity   Alcohol use: No    Alcohol/week: 0.0 standard drinks of alcohol   Drug use: No   Sexual activity: Yes    Partners: Male  Other Topics Concern   Not on file  Social History Narrative   No children - lives alone   Recovering alcoholic   Enjoys gambling, traveling and riding motorcycles   Sister lives nearby - very close   Social Determinants of Health   Financial Resource Strain: Low Risk  (10/12/2020)   Overall Financial Resource Strain (CARDIA)    Difficulty of Paying Living Expenses: Not hard at all  Food Insecurity: No Food Insecurity (10/12/2020)    Hunger Vital Sign    Worried About Running Out of Food in the Last Year: Never true    Ran Out of Food in the Last Year: Never true  Transportation Needs: No Transportation Needs (10/12/2020)   PRAPARE - Administrator, Civil Service (Medical): No    Lack of Transportation (Non-Medical): No  Physical Activity: Insufficiently Active (10/12/2020)   Exercise Vital Sign    Days of Exercise per Week: 7 days    Minutes of Exercise per Session: 10 min  Stress: No Stress Concern Present (10/12/2020)   [employer] of Occupational Health - Occupational Stress  Questionnaire    Feeling of Stress : Not at all  Social Connections: Socially Isolated (10/12/2020)   Social Connection and Isolation Panel [NHANES]    Frequency of Communication with Friends and Family: More than three times a week    Frequency of Social Gatherings with Friends and Family: More than three times a week    Attends Religious Services: Never    Database administrator or Organizations: No    Attends Banker Meetings: Never    Marital Status: Widowed  Intimate Partner Violence: Not At Risk (10/12/2020)   Humiliation, Afraid, Rape, and Kick questionnaire    Fear of Current or Ex-Partner: No    Emotionally Abused: No    Physically Abused: No    Sexually Abused: No   Family Status  Relation Name Status   Mother  Deceased   Father  Deceased   Sister  (Not Specified)   MGM  (Not Specified)   Niece  Alive   Family History  Problem Relation Age of Onset   Heart attack Mother        mi in 73s   Alcohol abuse Mother        cirrhosis of liver   Heart attack Father        MI in 57s   Cancer Sister        pt unaware of what kind   Rectal cancer Maternal Grandmother    Cancer Maternal Grandmother        breast   Heart attack Niece    Allergies  Allergen Reactions   Codeine Nausea And Vomiting   Interferon Beta-1a Nausea And Vomiting and Other (See Comments)    Severe flu-like  symptoms Symptoms expected   Sulfonamide Derivatives Nausea Only      Review of Systems  Constitutional:  Negative for fever and malaise/fatigue.  HENT:  Negative for congestion.   Eyes:  Negative for blurred vision.  Respiratory:  Negative for shortness of breath.   Cardiovascular:  Negative for chest pain, palpitations and leg swelling.  Gastrointestinal:  Negative for abdominal pain, blood in stool and nausea.  Genitourinary:  Negative for dysuria and frequency.  Musculoskeletal:  Negative for falls.  Skin:  Negative for rash.  Neurological:  Negative for dizziness, loss of consciousness and headaches.  Endo/Heme/Allergies:  Negative for environmental allergies.  Psychiatric/Behavioral:  Negative for depression. The patient is not nervous/anxious.       Objective:     BP 130/80 (BP Location: Left Arm, Patient Position: Sitting, Cuff Size: Large)   Pulse 88   Temp 97.7 F (36.5 C) (Oral)   Ht 5\' 3"  (1.6 m)   Wt 251 lb 6.4 oz (114 kg)   HC 18" (45.7 cm)   SpO2 96%   BMI 44.53 kg/m  BP Readings from Last 3 Encounters:  08/03/22 130/80  02/19/22 130/70  01/09/22 138/88   Wt Readings from Last 3 Encounters:  08/03/22 251 lb 6.4 oz (114 kg)  02/19/22 249 lb (112.9 kg)  01/09/22 249 lb (112.9 kg)   SpO2 Readings from Last 3 Encounters:  08/03/22 96%  02/19/22 96%  01/09/22 98%      Physical Exam Vitals and nursing note reviewed.  Constitutional:      General: She is not in acute distress.    Appearance: Normal appearance.  HENT:     Head: Normocephalic and atraumatic.     Right Ear: Tympanic membrane, ear canal and external ear normal. There is no  impacted cerumen.     Left Ear: Tympanic membrane, ear canal and external ear normal. There is no impacted cerumen.     Nose: Nose normal. No congestion or rhinorrhea.     Right Sinus: No maxillary sinus tenderness or frontal sinus tenderness.     Left Sinus: No maxillary sinus tenderness or frontal sinus  tenderness.     Mouth/Throat:     Mouth: Mucous membranes are moist.     Pharynx: Oropharynx is clear. No oropharyngeal exudate or posterior oropharyngeal erythema.  Eyes:     General: No scleral icterus.       Right eye: No discharge.        Left eye: No discharge.     Conjunctiva/sclera: Conjunctivae normal.  Cardiovascular:     Rate and Rhythm: Normal rate and regular rhythm.     Heart sounds: Normal heart sounds.  Pulmonary:     Effort: Pulmonary effort is normal. No respiratory distress.     Breath sounds: Normal breath sounds.  Musculoskeletal:        General: Normal range of motion.     Cervical back: Normal range of motion.  Lymphadenopathy:     Cervical: No cervical adenopathy.  Skin:    General: Skin is warm and dry.  Neurological:     Mental Status: She is alert and oriented to person, place, and time.  Psychiatric:        Mood and Affect: Mood normal.        Behavior: Behavior normal.        Thought Content: Thought content normal.        Judgment: Judgment normal.      No results found for any visits on 08/03/22.  Last CBC Lab Results  Component Value Date   WBC 3.4 (L) 01/09/2022   HGB 14.5 01/09/2022   HCT 41.9 01/09/2022   MCV 88.2 01/09/2022   MCH 29.5 07/14/2018   RDW 15.0 01/09/2022   PLT 200.0 01/09/2022   Last metabolic panel Lab Results  Component Value Date   GLUCOSE 106 (H) 01/09/2022   NA 141 01/09/2022   K 3.6 01/09/2022   CL 104 01/09/2022   CO2 30 01/09/2022   BUN 10 01/09/2022   CREATININE 0.74 01/09/2022   GFRNONAA >60 07/21/2018   CALCIUM 8.9 01/09/2022   PROT 7.1 01/09/2022   ALBUMIN 4.3 01/09/2022   BILITOT 0.5 01/09/2022   ALKPHOS 75 01/09/2022   AST 17 01/09/2022   ALT 25 01/09/2022   ANIONGAP 10 07/21/2018   Last lipids Lab Results  Component Value Date   CHOL 158 01/09/2022   HDL 45.10 01/09/2022   LDLCALC 98 01/09/2022   TRIG 75.0 01/09/2022   CHOLHDL 4 01/09/2022   Last hemoglobin A1c Lab Results   Component Value Date   HGBA1C 5.3 01/09/2022   Last thyroid functions Lab Results  Component Value Date   TSH 1.65 09/05/2015   Last vitamin D No results found for: "25OHVITD2", "25OHVITD3", "VD25OH" Last vitamin B12 and Folate No results found for: "VITAMINB12", "FOLATE"    The 10-year ASCVD risk score (Arnett DK, et al., 2019) is: 17.5%    Assessment & Plan:   Problem List Items Addressed This Visit       Unprioritized   Type 2 diabetes mellitus with hyperglycemia, without long-term current use of insulin (HCC)    Per endo      Relevant Orders   Comprehensive metabolic panel   Hemoglobin A1c   Insulin, random  Microalbumin / creatinine urine ratio   Severe obesity (BMI >= 40) (HCC)    Con't with diet and exercise       Hyperlipidemia associated with type 2 diabetes mellitus (HCC)    Encourage heart healthy diet such as MIND or DASH diet, increase exercise, avoid trans fats, simple carbohydrates and processed foods, consider a krill or fish or flaxseed oil cap daily.        Essential hypertension    Well controlled, no changes to meds. Encouraged heart healthy diet such as the DASH diet and exercise as tolerated.        Relevant Orders   CBC with Differential/Platelet   Comprehensive metabolic panel   Lipid panel   TSH   Colon cancer screening - Primary   Relevant Orders   Cologuard   Other Visit Diagnoses     Hyperlipidemia, unspecified hyperlipidemia type       Relevant Orders   CBC with Differential/Platelet   Comprehensive metabolic panel   Lipid panel     Assessment and Plan    Weight Management: Patient reports a 2-pound weight gain but has been making dietary changes including increased water intake, consumption of Activia for bowel regularity, and decreased caffeine intake due to sleep disorder. -Encourage continued healthy dietary habits and regular exercise.  Constipation: Improved with dietary changes including increased water intake  and consumption of Activia. -Continue current dietary habits. -Consider daily stool softener if constipation recurs.  Sleep Disorder: Patient is consuming decaffeinated coffee to manage sleep disorder. -Continue current management strategy.  General Health Maintenance: -Encourage regular dental visits. -Schedule gynecological appointment and mammogram. -Continue regular dermatology visits. -Order labs for next visit.        Return in about 6 months (around 02/03/2023), or if symptoms worsen or fail to improve, for hypertension, hyperlipidemia, diabetes II.    Donato Schultz, DO

## 2022-08-03 NOTE — Patient Instructions (Signed)
Preventive Care 40-64 Years Old, Female Preventive care refers to lifestyle choices and visits with your health care provider that can promote health and wellness. Preventive care visits are also called wellness exams. What can I expect for my preventive care visit? Counseling Your health care provider may ask you questions about your: Medical history, including: Past medical problems. Family medical history. Pregnancy history. Current health, including: Menstrual cycle. Method of birth control. Emotional well-being. Home life and relationship well-being. Sexual activity and sexual health. Lifestyle, including: Alcohol, nicotine or tobacco, and drug use. Access to firearms. Diet, exercise, and sleep habits. Work and work environment. Sunscreen use. Safety issues such as seatbelt and bike helmet use. Physical exam Your health care provider will check your: Height and weight. These may be used to calculate your BMI (body mass index). BMI is a measurement that tells if you are at a healthy weight. Waist circumference. This measures the distance around your waistline. This measurement also tells if you are at a healthy weight and may help predict your risk of certain diseases, such as type 2 diabetes and high blood pressure. Heart rate and blood pressure. Body temperature. Skin for abnormal spots. What immunizations do I need?  Vaccines are usually given at various ages, according to a schedule. Your health care provider will recommend vaccines for you based on your age, medical history, and lifestyle or other factors, such as travel or where you work. What tests do I need? Screening Your health care provider may recommend screening tests for certain conditions. This may include: Lipid and cholesterol levels. Diabetes screening. This is done by checking your blood sugar (glucose) after you have not eaten for a while (fasting). Pelvic exam and Pap test. Hepatitis B test. Hepatitis C  test. HIV (human immunodeficiency virus) test. STI (sexually transmitted infection) testing, if you are at risk. Lung cancer screening. Colorectal cancer screening. Mammogram. Talk with your health care provider about when you should start having regular mammograms. This may depend on whether you have a family history of breast cancer. BRCA-related cancer screening. This may be done if you have a family history of breast, ovarian, tubal, or peritoneal cancers. Bone density scan. This is done to screen for osteoporosis. Talk with your health care provider about your test results, treatment options, and if necessary, the need for more tests. Follow these instructions at home: Eating and drinking  Eat a diet that includes fresh fruits and vegetables, whole grains, lean protein, and low-fat dairy products. Take vitamin and mineral supplements as recommended by your health care provider. Do not drink alcohol if: Your health care provider tells you not to drink. You are pregnant, may be pregnant, or are planning to become pregnant. If you drink alcohol: Limit how much you have to 0-1 drink a day. Know how much alcohol is in your drink. In the [place] one drink equals one 12 oz bottle of beer (355 mL), one 5 oz glass of wine (148 mL), or one 1 oz glass of hard liquor (44 mL). Lifestyle Brush your teeth every morning and night with fluoride toothpaste. Floss one time each day. Exercise for at least 30 minutes 5 or more days each week. Do not use any products that contain nicotine or tobacco. These products include cigarettes, chewing tobacco, and vaping devices, such as e-cigarettes. If you need help quitting, ask your health care provider. Do not use drugs. If you are sexually active, practice safe sex. Use a condom or other form of protection to   prevent STIs. If you do not wish to become pregnant, use a form of birth control. If you plan to become pregnant, see your health care provider for a  prepregnancy visit. Take aspirin only as told by your health care provider. Make sure that you understand how much to take and what form to take. Work with your health care provider to find out whether it is safe and beneficial for you to take aspirin daily. Find healthy ways to manage stress, such as: Meditation, yoga, or listening to music. Journaling. Talking to a trusted person. Spending time with friends and family. Minimize exposure to UV radiation to reduce your risk of skin cancer. Safety Always wear your seat belt while driving or riding in a vehicle. Do not drive: If you have been drinking alcohol. Do not ride with someone who has been drinking. When you are tired or distracted. While texting. If you have been using any mind-altering substances or drugs. Wear a helmet and other protective equipment during sports activities. If you have firearms in your house, make sure you follow all gun safety procedures. Seek help if you have been physically or sexually abused. What's next? Visit your health care provider once a year for an annual wellness visit. Ask your health care provider how often you should have your eyes and teeth checked. Stay up to date on all vaccines. This information is not intended to replace advice given to you by your health care provider. Make sure you discuss any questions you have with your health care provider. Document Revised: 07/17/2020 Document Reviewed: 07/17/2020 Elsevier Patient Education  2024 [employer]  

## 2022-08-03 NOTE — Assessment & Plan Note (Signed)
Cont with diet and exercise 

## 2022-08-04 ENCOUNTER — Other Ambulatory Visit (INDEPENDENT_AMBULATORY_CARE_PROVIDER_SITE_OTHER): Payer: Medicare HMO

## 2022-08-04 DIAGNOSIS — E1165 Type 2 diabetes mellitus with hyperglycemia: Secondary | ICD-10-CM | POA: Diagnosis not present

## 2022-08-04 LAB — MICROALBUMIN / CREATININE URINE RATIO
Creatinine,U: 108.7 mg/dL
Microalb Creat Ratio: 0.6 mg/g (ref 0.0–30.0)
Microalb, Ur: <0.7 mg/dL (ref 0.0–1.9)

## 2022-08-04 LAB — INSULIN, RANDOM: Insulin: 221.5 u[IU]/mL — ABNORMAL HIGH

## 2022-08-04 NOTE — Progress Notes (Signed)
Pt returning urine sample she was unable to provide at previous visit.

## 2022-08-05 ENCOUNTER — Ambulatory Visit: Payer: Medicare HMO

## 2022-08-10 ENCOUNTER — Ambulatory Visit
Admission: RE | Admit: 2022-08-10 | Discharge: 2022-08-10 | Disposition: A | Discharge status: Home / Self Care | Payer: Medicare HMO | Source: Ambulatory Visit | Attending: Family Medicine | Admitting: Family Medicine

## 2022-08-10 DIAGNOSIS — Z1231 Encounter for screening mammogram for malignant neoplasm of breast: Secondary | ICD-10-CM

## 2022-08-11 ENCOUNTER — Other Ambulatory Visit: Payer: Self-pay

## 2022-08-11 DIAGNOSIS — E1165 Type 2 diabetes mellitus with hyperglycemia: Secondary | ICD-10-CM

## 2022-08-11 DIAGNOSIS — I10 Essential (primary) hypertension: Secondary | ICD-10-CM

## 2022-08-11 DIAGNOSIS — E1169 Type 2 diabetes mellitus with other specified complication: Secondary | ICD-10-CM

## 2022-08-11 MED ORDER — ROSUVASTATIN CALCIUM 10 MG PO TABS
ORAL_TABLET | ORAL | 0 refills | Status: DC
Start: 2022-08-11 — End: 2022-11-09

## 2022-08-12 ENCOUNTER — Ambulatory Visit: Payer: Medicare HMO | Admitting: Podiatry

## 2022-08-14 LAB — COLOGUARD: COLOGUARD: NEGATIVE

## 2022-08-27 ENCOUNTER — Telehealth: Payer: Self-pay | Admitting: Family Medicine

## 2022-08-27 ENCOUNTER — Other Ambulatory Visit: Payer: Self-pay

## 2022-08-27 DIAGNOSIS — E1165 Type 2 diabetes mellitus with hyperglycemia: Secondary | ICD-10-CM

## 2022-08-27 MED ORDER — OZEMPIC (2 MG/DOSE) 8 MG/3ML ~~LOC~~ SOPN
PEN_INJECTOR | SUBCUTANEOUS | 3 refills | Status: DC
Start: 2022-08-27 — End: 2023-01-12

## 2022-08-27 NOTE — Telephone Encounter (Signed)
Refills sent

## 2022-08-27 NOTE — Telephone Encounter (Signed)
Patient called and is needing a med refill on OZEMPIC, 2 MG/DOSE, 8 MG/3ML SOPN . Please send to [facility] on [id number]

## 2022-10-30 ENCOUNTER — Ambulatory Visit: Payer: Medicare HMO | Admitting: Podiatry

## 2022-11-06 ENCOUNTER — Ambulatory Visit: Payer: Medicare HMO | Admitting: Podiatry

## 2022-11-06 DIAGNOSIS — S93402A Sprain of unspecified ligament of left ankle, initial encounter: Secondary | ICD-10-CM

## 2022-11-06 NOTE — Progress Notes (Signed)
Subjective:  Patient ID: Molly Klein, female    DOB: 10/10/1960,  MRN: [id number]  Chief Complaint  Patient presents with   Nail Problem    62 y.o. female presents with the above complaint.  Patient presents with complaint of inversion injury to the left ankle.  She states that happened few weeks ago is not getting better she want to get it evaluated she did not immobilize it.  She has not seen MRI as part of same he denies any other acute complaints.  Currently not a diabetic.   Review of Systems: Negative except as noted in the HPI. Denies N/V/F/Ch.  Past Medical History:  Diagnosis Date   Asthma    Bruising 10/21/2012   Cervical spinal stenosis    L4-5   DVT (deep venous thrombosis) (HCC)    GERD (gastroesophageal reflux disease)    Hepatitis C    Treated and cured per pt   History of DVT of lower extremity 10/21/2012   Hypertension    Knee pain    left    Neuromuscular disorder (HCC)    neuropathy lt arm   Nipple discharge    right breast   OSA (obstructive sleep apnea)     Current Outpatient Medications:    albuterol (PROVENTIL HFA;VENTOLIN HFA) 108 (90 Base) MCG/ACT inhaler, Inhale 2 puffs into the lungs 4 (four) times daily as needed. Shortness of breath (Patient taking differently: Inhale 2 puffs into the lungs 4 (four) times daily as needed for wheezing or shortness of breath.), Disp: 1 Inhaler, Rfl: 6   albuterol (PROVENTIL) (2.5 MG/3ML) 0.083% nebulizer solution, INHALE 1 VIAL BY NEBULIZER AS DIRECTED (Patient taking differently: Take 2.5 mg by nebulization 4 (four) times daily as needed for wheezing or shortness of breath.), Disp: 75 mL, Rfl: 0   ammonium lactate (AMLACTIN) 12 % lotion, Apply 1 Application topically as needed for dry skin., Disp: 400 g, Rfl: 0   Blood Glucose Monitoring Suppl (ONETOUCH VERIO FLEX SYSTEM) w/Device KIT, Check blood sugar up to 4 times a day. Dx Code: E11.65, Disp: 1 kit, Rfl: 0   fluticasone (FLONASE) 50 MCG/ACT nasal spray,  Place 2 sprays into both nostrils daily., Disp: 16 g, Rfl: 6   glucose blood (ONETOUCH VERIO) test strip, Check blood sugar up to 4 times a day. Dx Code: E11.65, Disp: 400 each, Rfl: 1   hydrochlorothiazide (HYDRODIURIL) 25 MG tablet, TAKE 1 TABLET(25 MG) BY MOUTH DAILY, Disp: 90 tablet, Rfl: 1   Lancets (ONETOUCH DELICA PLUS LANCET33G) MISC, Check blood sugar up to 4 times a day. Dx Code: E11.65, Disp: 400 each, Rfl: 1   lisinopril (ZESTRIL) 5 MG tablet, Take 1 tablet (5 mg total) by mouth daily., Disp: 90 tablet, Rfl: 3   metFORMIN (GLUCOPHAGE) 500 MG tablet, TAKE 1 TABLET(500 MG) BY MOUTH TWICE DAILY WITH A MEAL (Patient not taking: Reported on 08/03/2022), Disp: 90 tablet, Rfl: 1   Polyethyl Glycol-Propyl Glycol (SYSTANE OP), Apply to eye., Disp: , Rfl:    rosuvastatin (CRESTOR) 10 MG tablet, Take 1 tablet (10 mg total) by mouth daily., Disp: 90 tablet, Rfl: 1   Semaglutide, 2 MG/DOSE, (OZEMPIC, 2 MG/DOSE,) 8 MG/3ML SOPN, INJECT 2 MG UNDER THE SKIN ONCE A WEEK AS DIRECTED, Disp: 3 mL, Rfl: 3   valACYclovir (VALTREX) 1000 MG tablet, TAKE 1 TABLET BY MOUTH THREE TIMES DAILY FOR 7 DAYS THEN TAKE 1 TABLET BY MOUTH EVERY DAY, Disp: 42 tablet, Rfl: 5  Social History   Tobacco Use  Smoking Status Former   Current packs/day: 0.00   Types: Cigarettes   Start date: 02/02/1970   Quit date: 02/03/1975   Years since quitting: 47.8  Smokeless Tobacco Never  Tobacco Comments   1 pack per week    Allergies  Allergen Reactions   Codeine Nausea And Vomiting   Interferon Beta-1a Nausea And Vomiting and Other (See Comments)    Severe flu-like symptoms Symptoms expected   Sulfonamide Derivatives Nausea Only   Objective:  There were no vitals filed for this visit. There is no height or weight on file to calculate BMI. Constitutional Well developed. Well nourished.  Vascular Dorsalis pedis pulses palpable bilaterally. Posterior tibial pulses palpable bilaterally. Capillary refill normal to all digits.   No cyanosis or clubbing noted. Pedal hair growth normal.  Neurologic Normal speech. Oriented to person, place, and time. Epicritic sensation to light touch grossly present bilaterally.  Dermatologic Nails well groomed and normal in appearance. No open wounds. No skin lesions.  Orthopedic: Left pain on palpation to the ATFL ligament pain with plantarflexion injury of the foot no pain with dorsiflexion eversion of the foot no pain at the peroneal tendon Achilles tendon posterior tibial tendon.  Negative anterior drawer test or talar tilt test   Radiographs: None Assessment:   1. Moderate ankle sprain, left, initial encounter    Plan:  Patient was evaluated and treated and all questions answered.  Left ankle sprain moderate -All questions and concerns were discussed with the patient in extensive detail given the amount of pain that she is having she would benefit from cam boot immobilization she already has a cam boot at home.  At this time I encouraged her to wear the boot for next 4 weeks she states understanding will do so immediately  No follow-ups on file.

## 2022-11-09 ENCOUNTER — Other Ambulatory Visit: Payer: Self-pay | Admitting: Family Medicine

## 2022-11-09 DIAGNOSIS — E785 Hyperlipidemia, unspecified: Secondary | ICD-10-CM

## 2022-11-11 ENCOUNTER — Other Ambulatory Visit: Payer: Medicare HMO

## 2022-11-12 ENCOUNTER — Other Ambulatory Visit (INDEPENDENT_AMBULATORY_CARE_PROVIDER_SITE_OTHER): Payer: Medicare HMO

## 2022-11-12 DIAGNOSIS — E1165 Type 2 diabetes mellitus with hyperglycemia: Secondary | ICD-10-CM | POA: Diagnosis not present

## 2022-11-12 DIAGNOSIS — E1169 Type 2 diabetes mellitus with other specified complication: Secondary | ICD-10-CM | POA: Diagnosis not present

## 2022-11-12 DIAGNOSIS — I10 Essential (primary) hypertension: Secondary | ICD-10-CM

## 2022-11-12 DIAGNOSIS — E785 Hyperlipidemia, unspecified: Secondary | ICD-10-CM

## 2022-11-12 LAB — COMPREHENSIVE METABOLIC PANEL
ALT: 25 U/L (ref 0–35)
AST: 14 U/L (ref 0–37)
Albumin: 4.1 g/dL (ref 3.5–5.2)
Alkaline Phosphatase: 69 U/L (ref 39–117)
BUN: 13 mg/dL (ref 6–23)
CO2: 32 meq/L (ref 19–32)
Calcium: 9.6 mg/dL (ref 8.4–10.5)
Chloride: 102 meq/L (ref 96–112)
Creatinine, Ser: 0.75 mg/dL (ref 0.40–1.20)
GFR: 84.09 mL/min (ref >60.00)
Glucose, Bld: 102 mg/dL — ABNORMAL HIGH (ref 70–99)
Potassium: 4.3 meq/L (ref 3.5–5.1)
Sodium: 140 meq/L (ref 135–145)
Total Bilirubin: 0.5 mg/dL (ref 0.2–1.2)
Total Protein: 6.6 g/dL (ref 6.0–8.3)

## 2022-11-12 LAB — CBC WITH DIFFERENTIAL/PLATELET
Basophils Absolute: 0 10*3/uL (ref 0.0–0.1)
Basophils Relative: 1.1 % (ref 0.0–3.0)
Eosinophils Absolute: 0.2 10*3/uL (ref 0.0–0.7)
Eosinophils Relative: 4.5 % (ref 0.0–5.0)
HCT: 42.2 % (ref 36.0–46.0)
Hemoglobin: 14.2 g/dL (ref 12.0–15.0)
Lymphocytes Relative: 36.9 % (ref 12.0–46.0)
Lymphs Abs: 1.3 10*3/uL (ref 0.7–4.0)
MCHC: 33.7 g/dL (ref 30.0–36.0)
MCV: 90 fL (ref 78.0–100.0)
Monocytes Absolute: 0.2 10*3/uL (ref 0.1–1.0)
Monocytes Relative: 5.7 % (ref 3.0–12.0)
Neutro Abs: 1.8 10*3/uL (ref 1.4–7.7)
Neutrophils Relative %: 51.8 % (ref 43.0–77.0)
Platelets: 201 10*3/uL (ref 150.0–400.0)
RBC: 4.69 Mil/uL (ref 3.87–5.11)
RDW: 15.1 % (ref 11.5–15.5)
WBC: 3.5 10*3/uL — ABNORMAL LOW (ref 4.0–10.5)

## 2022-11-12 LAB — LIPID PANEL
Cholesterol: 138 mg/dL (ref 0–200)
HDL: 50.6 mg/dL (ref >39.00)
LDL Cholesterol: 74 mg/dL (ref 0–99)
NonHDL: 87
Total CHOL/HDL Ratio: 3
Triglycerides: 66 mg/dL (ref 0.0–149.0)
VLDL: 13.2 mg/dL (ref 0.0–40.0)

## 2022-11-13 LAB — INSULIN, RANDOM: Insulin: 107.9 u[IU]/mL — ABNORMAL HIGH

## 2022-12-04 ENCOUNTER — Ambulatory Visit: Payer: Medicare HMO | Admitting: Podiatry

## 2022-12-04 DIAGNOSIS — S93402A Sprain of unspecified ligament of left ankle, initial encounter: Secondary | ICD-10-CM

## 2022-12-04 NOTE — Progress Notes (Signed)
Subjective:  Patient ID: Molly Klein, female    DOB: 1960/12/29,  MRN: [id number]  Chief Complaint  Patient presents with   Foot Pain    Follow up right foot patient states doing better has been wearing boot.    62 y.o. female presents with the above complaint.  Patient presents with complaint of left inversion injury.  She states she is doing better.  Boot has helped.  She still has some residual pain denies any other acute complaints   Review of Systems: Negative except as noted in the HPI. Denies N/V/F/Ch.  Past Medical History:  Diagnosis Date   Asthma    Bruising 10/21/2012   Cervical spinal stenosis    L4-5   DVT (deep venous thrombosis) (HCC)    GERD (gastroesophageal reflux disease)    Hepatitis C    Treated and cured per pt   History of DVT of lower extremity 10/21/2012   Hypertension    Knee pain    left    Neuromuscular disorder (HCC)    neuropathy lt arm   Nipple discharge    right breast   OSA (obstructive sleep apnea)     Current Outpatient Medications:    albuterol (PROVENTIL HFA;VENTOLIN HFA) 108 (90 Base) MCG/ACT inhaler, Inhale 2 puffs into the lungs 4 (four) times daily as needed. Shortness of breath (Patient taking differently: Inhale 2 puffs into the lungs 4 (four) times daily as needed for wheezing or shortness of breath.), Disp: 1 Inhaler, Rfl: 6   albuterol (PROVENTIL) (2.5 MG/3ML) 0.083% nebulizer solution, INHALE 1 VIAL BY NEBULIZER AS DIRECTED (Patient taking differently: Take 2.5 mg by nebulization 4 (four) times daily as needed for wheezing or shortness of breath.), Disp: 75 mL, Rfl: 0   ammonium lactate (AMLACTIN) 12 % lotion, Apply 1 Application topically as needed for dry skin., Disp: 400 g, Rfl: 0   Blood Glucose Monitoring Suppl (ONETOUCH VERIO FLEX SYSTEM) w/Device KIT, Check blood sugar up to 4 times a day. Dx Code: E11.65, Disp: 1 kit, Rfl: 0   fluticasone (FLONASE) 50 MCG/ACT nasal spray, Place 2 sprays into both nostrils daily.,  Disp: 16 g, Rfl: 6   glucose blood (ONETOUCH VERIO) test strip, Check blood sugar up to 4 times a day. Dx Code: E11.65, Disp: 400 each, Rfl: 1   hydrochlorothiazide (HYDRODIURIL) 25 MG tablet, TAKE 1 TABLET(25 MG) BY MOUTH DAILY, Disp: 90 tablet, Rfl: 1   Lancets (ONETOUCH DELICA PLUS LANCET33G) MISC, Check blood sugar up to 4 times a day. Dx Code: E11.65, Disp: 400 each, Rfl: 1   lisinopril (ZESTRIL) 5 MG tablet, Take 1 tablet (5 mg total) by mouth daily., Disp: 90 tablet, Rfl: 3   metFORMIN (GLUCOPHAGE) 500 MG tablet, TAKE 1 TABLET(500 MG) BY MOUTH TWICE DAILY WITH A MEAL, Disp: 90 tablet, Rfl: 1   Polyethyl Glycol-Propyl Glycol (SYSTANE OP), Apply to eye., Disp: , Rfl:    rosuvastatin (CRESTOR) 10 MG tablet, Take 1 tablet (10 mg total) by mouth daily., Disp: 90 tablet, Rfl: 1   Semaglutide, 2 MG/DOSE, (OZEMPIC, 2 MG/DOSE,) 8 MG/3ML SOPN, INJECT 2 MG UNDER THE SKIN ONCE A WEEK AS DIRECTED, Disp: 3 mL, Rfl: 3   valACYclovir (VALTREX) 1000 MG tablet, TAKE 1 TABLET BY MOUTH THREE TIMES DAILY FOR 7 DAYS THEN TAKE 1 TABLET BY MOUTH EVERY DAY, Disp: 42 tablet, Rfl: 5  Social History   Tobacco Use  Smoking Status Former   Current packs/day: 0.00   Types: Cigarettes   Start  date: 02/02/1970   Quit date: 02/03/1975   Years since quitting: 47.8  Smokeless Tobacco Never  Tobacco Comments   1 pack per week    Allergies  Allergen Reactions   Codeine Nausea And Vomiting   Interferon Beta-1a Nausea And Vomiting and Other (See Comments)    Severe flu-like symptoms Symptoms expected   Sulfonamide Derivatives Nausea Only   Objective:  There were no vitals filed for this visit. There is no height or weight on file to calculate BMI. Constitutional Well developed. Well nourished.  Vascular Dorsalis pedis pulses palpable bilaterally. Posterior tibial pulses palpable bilaterally. Capillary refill normal to all digits.  No cyanosis or clubbing noted. Pedal hair growth normal.  Neurologic Normal  speech. Oriented to person, place, and time. Epicritic sensation to light touch grossly present bilaterally.  Dermatologic Nails well groomed and normal in appearance. No open wounds. No skin lesions.  Orthopedic: No further left pain on palpation to the ATFL ligament pain with plantarflexion injury of the foot no pain with dorsiflexion eversion of the foot no pain at the peroneal tendon Achilles tendon posterior tibial tendon.  Negative anterior drawer test or talar tilt test   Radiographs: None Assessment:   No diagnosis found.  Plan:  Patient was evaluated and treated and all questions answered.  Left ankle sprain moderate -All questions and concerns were discussed with the patient in extensive detail -patient's pain has been reduced with cam boot she can transition out of cam boot with Tri-Lock ankle brace into regular shoes.  Tri-Lock ankle brace was dispensed if any foot and ankle issues are future she will come back and see me.  No follow-ups on file.

## 2022-12-30 ENCOUNTER — Encounter: Payer: Self-pay | Admitting: Podiatry

## 2022-12-30 ENCOUNTER — Ambulatory Visit: Payer: Medicare HMO | Admitting: Podiatry

## 2022-12-30 DIAGNOSIS — M7752 Other enthesopathy of left foot: Secondary | ICD-10-CM | POA: Diagnosis not present

## 2022-12-30 DIAGNOSIS — S93402A Sprain of unspecified ligament of left ankle, initial encounter: Secondary | ICD-10-CM

## 2022-12-30 NOTE — Progress Notes (Signed)
Subjective:  Patient ID: Molly Klein, female    DOB: Jun 13, 1960,  MRN: [id number]  Chief Complaint  Patient presents with   Routine Post Op    PATIENT STATES EVERYTHING HAS BEEN OK , HERE AND THERE IT CAN BE SORE. NO MEDICATION FOR PAIN     62 y.o. female presents with the above complaint.  Patient presents with complaint of left inversion injury.  She states she is doing a lot better.  She was able to tolerate the Tri-Lock ankle brace she still has some residual pain but overall much improved.  Denies any other acute complaints   Review of Systems: Negative except as noted in the HPI. Denies N/V/F/Ch.  Past Medical History:  Diagnosis Date   Asthma    Bruising 10/21/2012   Cervical spinal stenosis    L4-5   DVT (deep venous thrombosis) (HCC)    GERD (gastroesophageal reflux disease)    Hepatitis C    Treated and cured per pt   History of DVT of lower extremity 10/21/2012   Hypertension    Knee pain    left    Neuromuscular disorder (HCC)    neuropathy lt arm   Nipple discharge    right breast   OSA (obstructive sleep apnea)     Current Outpatient Medications:    albuterol (PROVENTIL HFA;VENTOLIN HFA) 108 (90 Base) MCG/ACT inhaler, Inhale 2 puffs into the lungs 4 (four) times daily as needed. Shortness of breath (Patient taking differently: Inhale 2 puffs into the lungs 4 (four) times daily as needed for wheezing or shortness of breath.), Disp: 1 Inhaler, Rfl: 6   albuterol (PROVENTIL) (2.5 MG/3ML) 0.083% nebulizer solution, INHALE 1 VIAL BY NEBULIZER AS DIRECTED (Patient taking differently: Take 2.5 mg by nebulization 4 (four) times daily as needed for wheezing or shortness of breath.), Disp: 75 mL, Rfl: 0   ammonium lactate (AMLACTIN) 12 % lotion, Apply 1 Application topically as needed for dry skin., Disp: 400 g, Rfl: 0   Blood Glucose Monitoring Suppl (ONETOUCH VERIO FLEX SYSTEM) w/Device KIT, Check blood sugar up to 4 times a day. Dx Code: E11.65, Disp: 1 kit, Rfl:  0   fluticasone (FLONASE) 50 MCG/ACT nasal spray, Place 2 sprays into both nostrils daily., Disp: 16 g, Rfl: 6   glucose blood (ONETOUCH VERIO) test strip, Check blood sugar up to 4 times a day. Dx Code: E11.65, Disp: 400 each, Rfl: 1   hydrochlorothiazide (HYDRODIURIL) 25 MG tablet, TAKE 1 TABLET(25 MG) BY MOUTH DAILY, Disp: 90 tablet, Rfl: 1   Lancets (ONETOUCH DELICA PLUS LANCET33G) MISC, Check blood sugar up to 4 times a day. Dx Code: E11.65, Disp: 400 each, Rfl: 1   lisinopril (ZESTRIL) 5 MG tablet, Take 1 tablet (5 mg total) by mouth daily., Disp: 90 tablet, Rfl: 3   metFORMIN (GLUCOPHAGE) 500 MG tablet, TAKE 1 TABLET(500 MG) BY MOUTH TWICE DAILY WITH A MEAL, Disp: 90 tablet, Rfl: 1   Polyethyl Glycol-Propyl Glycol (SYSTANE OP), Apply to eye., Disp: , Rfl:    rosuvastatin (CRESTOR) 10 MG tablet, Take 1 tablet (10 mg total) by mouth daily., Disp: 90 tablet, Rfl: 1   Semaglutide, 2 MG/DOSE, (OZEMPIC, 2 MG/DOSE,) 8 MG/3ML SOPN, INJECT 2 MG UNDER THE SKIN ONCE A WEEK AS DIRECTED, Disp: 3 mL, Rfl: 3   valACYclovir (VALTREX) 1000 MG tablet, TAKE 1 TABLET BY MOUTH THREE TIMES DAILY FOR 7 DAYS THEN TAKE 1 TABLET BY MOUTH EVERY DAY, Disp: 42 tablet, Rfl: 5  Social History  Tobacco Use  Smoking Status Former   Current packs/day: 0.00   Types: Cigarettes   Start date: 02/02/1970   Quit date: 02/03/1975   Years since quitting: 47.9  Smokeless Tobacco Never  Tobacco Comments   1 pack per week    Allergies  Allergen Reactions   Codeine Nausea And Vomiting   Interferon Beta-1a Nausea And Vomiting and Other (See Comments)    Severe flu-like symptoms Symptoms expected   Sulfonamide Derivatives Nausea Only   Objective:  There were no vitals filed for this visit. There is no height or weight on file to calculate BMI. Constitutional Well developed. Well nourished.  Vascular Dorsalis pedis pulses palpable bilaterally. Posterior tibial pulses palpable bilaterally. Capillary refill normal to all  digits.  No cyanosis or clubbing noted. Pedal hair growth normal.  Neurologic Normal speech. Oriented to person, place, and time. Epicritic sensation to light touch grossly present bilaterally.  Dermatologic Nails well groomed and normal in appearance. No open wounds. No skin lesions.  Orthopedic: Mild left pain on palpation to the ATFL ligament pain with plantarflexion injury of the foot no pain with dorsiflexion eversion of the foot no pain at the peroneal tendon Achilles tendon posterior tibial tendon.  Negative anterior drawer test or talar tilt test   Radiographs: None Assessment:   1. Capsulitis of ankle, left   2. Moderate ankle sprain, left, initial encounter     Plan:  Patient was evaluated and treated and all questions answered.  Left ankle sprain moderate -All questions and concerns were discussed with the patient in extensive detail -patient pain has improved considerably.  At this time I discussed with her to continue using Tri-Lock ankle brace.  She still has slightly some residual pain for therefore injection will be beneficial I discussed with patient she states understand like to proceed with steroid injection -A steroid injection was performed at left ankle using 1% plain Lidocaine and 10 mg of Kenalog. This was well tolerated. -Discussed shoe gear modification

## 2023-01-08 ENCOUNTER — Telehealth: Payer: Self-pay | Admitting: Family Medicine

## 2023-01-08 NOTE — Telephone Encounter (Signed)
Pt called to notify PCP that she had a visit with UC on 12.2.24 for pneumonia. Pt did not need any other assistance with this matter at this time.

## 2023-01-12 ENCOUNTER — Other Ambulatory Visit: Payer: Self-pay | Admitting: Family Medicine

## 2023-01-12 DIAGNOSIS — E1165 Type 2 diabetes mellitus with hyperglycemia: Secondary | ICD-10-CM

## 2023-01-28 ENCOUNTER — Ambulatory Visit: Payer: Self-pay | Admitting: Family Medicine

## 2023-01-28 ENCOUNTER — Telehealth: Payer: Self-pay

## 2023-01-28 ENCOUNTER — Ambulatory Visit: Payer: Medicare HMO | Admitting: Family Medicine

## 2023-01-28 NOTE — Telephone Encounter (Signed)
Pt is on her way to UC

## 2023-01-28 NOTE — Telephone Encounter (Signed)
Copied from CRM [id number]. Topic: Clinical - Red Word Triage >> Jan 28, 2023  9:12 AM Fredrich Romans wrote: Red Word that prompted transfer to Nurse Triage: sob,hoarness,

## 2023-01-28 NOTE — Telephone Encounter (Signed)
Copied from CRM [id number]. Topic: Clinical - Red Word Triage >> Jan 28, 2023  9:12 AM Fredrich Romans wrote: Red Word that prompted transfer to Nurse Triage: sob,hoarness,  Chief Complaint: recent diagnosis of pneumonia at beginning of Dec.  Symptoms: cough, hoarse, weak, tired, sob Frequency: constant Pertinent Negatives: Patient denies fever, pain Disposition: [] ED /[x] Urgent Care (no appt availability in office) / [] Appointment(In office/virtual)/ []  [facility]/ [] Home Care/ [] Refused Recommended Disposition /[] [facility]/ []  Follow-up with PCP Additional Notes: Patient c/o difficulty breathing and not feeling better since diagnosised with pneumonia at beginning of December.  Denies fever.  Patient on way to UC for evaluation.  Instructed to go to ER if becomes worse.   Reason for Disposition  [1] Continuous (nonstop) coughing interferes with work or school AND [2] no improvement using cough treatment per Care Advice  Answer Assessment - Initial Assessment Questions 1. SYMPTOM: "What's the main symptom you're concerned about?" (e.g., breathing difficulty, fever, weakness)     Back hurts, coughing, winded easily.  2. ONSET: "When did the  pneumonia  start?"     Being of December.  3. BETTER-SAME-WORSE: "Are you getting better, staying the same, or getting worse compared to the day you were discharged?"     Staying about the same.  4. BREATHING DIFFICULTY: "Are you having any difficulty breathing?" If Yes, ask: "How bad is it?"  (e.g., none, mild, moderate, severe)   - MILD: No SOB at rest, mild SOB with walking, speaks normally in sentences, can lie down, no retractions, pulse < 100.    - MODERATE: SOB at rest, SOB with minimal exertion and prefers to sit, cannot lie down flat, speaks in phrases, mild retractions, audible wheezing, pulse 100-120.    - SEVERE: Very SOB at rest, speaks in single words, struggling to breathe, sitting hunched forward, retractions, pulse > 120       "Pretty bad" 5. FEVER: "Do you have a fever?" If Yes, ask: "What is your temperature, how was it measured, and when did it start?"     no  Protocols used: Pneumonia Follow-up Call-[facility]

## 2023-02-04 ENCOUNTER — Ambulatory Visit: Payer: Medicare HMO | Admitting: Family Medicine

## 2023-02-04 ENCOUNTER — Ambulatory Visit (HOSPITAL_BASED_OUTPATIENT_CLINIC_OR_DEPARTMENT_OTHER)
Admission: RE | Admit: 2023-02-04 | Discharge: 2023-02-04 | Disposition: A | Discharge status: Home / Self Care | Payer: Medicare HMO | Source: Ambulatory Visit | Attending: Family Medicine | Admitting: Family Medicine

## 2023-02-04 ENCOUNTER — Encounter: Payer: Self-pay | Admitting: Family Medicine

## 2023-02-04 ENCOUNTER — Telehealth: Payer: Self-pay

## 2023-02-04 VITALS — BP 124/70 | HR 100 | Temp 97.8°F | Resp 18 | Ht 63.0 in | Wt 244.8 lb

## 2023-02-04 DIAGNOSIS — I10 Essential (primary) hypertension: Secondary | ICD-10-CM

## 2023-02-04 DIAGNOSIS — E1169 Type 2 diabetes mellitus with other specified complication: Secondary | ICD-10-CM

## 2023-02-04 DIAGNOSIS — J189 Pneumonia, unspecified organism: Secondary | ICD-10-CM

## 2023-02-04 DIAGNOSIS — E785 Hyperlipidemia, unspecified: Secondary | ICD-10-CM

## 2023-02-04 DIAGNOSIS — E1165 Type 2 diabetes mellitus with hyperglycemia: Secondary | ICD-10-CM | POA: Diagnosis not present

## 2023-02-04 DIAGNOSIS — Z7985 Long-term (current) use of injectable non-insulin antidiabetic drugs: Secondary | ICD-10-CM

## 2023-02-04 LAB — LIPID PANEL
Cholesterol: 195 mg/dL (ref 0–200)
HDL: 70.8 mg/dL (ref >39.00)
LDL Cholesterol: 109 mg/dL — ABNORMAL HIGH (ref 0–99)
NonHDL: 123.92
Total CHOL/HDL Ratio: 3
Triglycerides: 77 mg/dL (ref 0.0–149.0)
VLDL: 15.4 mg/dL (ref 0.0–40.0)

## 2023-02-04 LAB — COMPREHENSIVE METABOLIC PANEL
ALT: 17 U/L (ref 0–35)
AST: 12 U/L (ref 0–37)
Albumin: 4.3 g/dL (ref 3.5–5.2)
Alkaline Phosphatase: 63 U/L (ref 39–117)
BUN: 15 mg/dL (ref 6–23)
CO2: 30 meq/L (ref 19–32)
Calcium: 9.6 mg/dL (ref 8.4–10.5)
Chloride: 99 meq/L (ref 96–112)
Creatinine, Ser: 0.95 mg/dL (ref 0.40–1.20)
GFR: 63.22 mL/min (ref >60.00)
Glucose, Bld: 80 mg/dL (ref 70–99)
Potassium: 3.8 meq/L (ref 3.5–5.1)
Sodium: 140 meq/L (ref 135–145)
Total Bilirubin: 0.7 mg/dL (ref 0.2–1.2)
Total Protein: 7.3 g/dL (ref 6.0–8.3)

## 2023-02-04 LAB — CBC WITH DIFFERENTIAL/PLATELET
Basophils Absolute: 0 10*3/uL (ref 0.0–0.1)
Basophils Relative: 0.5 % (ref 0.0–3.0)
Eosinophils Absolute: 0.1 10*3/uL (ref 0.0–0.7)
Eosinophils Relative: 1.6 % (ref 0.0–5.0)
HCT: 46.3 % — ABNORMAL HIGH (ref 36.0–46.0)
Hemoglobin: 15.7 g/dL — ABNORMAL HIGH (ref 12.0–15.0)
Lymphocytes Relative: 38.5 % (ref 12.0–46.0)
Lymphs Abs: 2.1 10*3/uL (ref 0.7–4.0)
MCHC: 33.9 g/dL (ref 30.0–36.0)
MCV: 90.2 fL (ref 78.0–100.0)
Monocytes Absolute: 0.4 10*3/uL (ref 0.1–1.0)
Monocytes Relative: 7.2 % (ref 3.0–12.0)
Neutro Abs: 2.8 10*3/uL (ref 1.4–7.7)
Neutrophils Relative %: 52.2 % (ref 43.0–77.0)
Platelets: 182 10*3/uL (ref 150.0–400.0)
RBC: 5.13 Mil/uL — ABNORMAL HIGH (ref 3.87–5.11)
RDW: 15.1 % (ref 11.5–15.5)
WBC: 5.4 10*3/uL (ref 4.0–10.5)

## 2023-02-04 LAB — HEMOGLOBIN A1C: Hgb A1c MFr Bld: 5.8 % (ref 4.6–6.5)

## 2023-02-04 NOTE — Assessment & Plan Note (Signed)
 Tolerating statin, encouraged heart healthy diet, avoid trans fats, minimize simple carbs and saturated fats. Increase exercise as tolerated

## 2023-02-04 NOTE — Patient Instructions (Signed)
 Community-Acquired Pneumonia, Adult  Pneumonia is a lung infection that causes inflammation and the buildup of mucus and fluids in the lungs. This may cause coughing and difficulty breathing. Community-acquired pneumonia is pneumonia that develops in people who are not, and have not recently been, in a hospital or other health care facility.  Usually, pneumonia develops as a result of an illness that is caused by a virus, such as the common cold and the flu (influenza). It can also be caused by bacteria or fungi. While the common cold and influenza can pass from person to person (are contagious), pneumonia itself is not considered contagious.  What are the causes?    This condition may be caused by:  Viruses.  Bacteria.  Fungi.  What increases the risk?  The following factors may make you more likely to develop this condition:  Being over age 8 or having certain medical conditions, such as:  A long-term (chronic) disease, such as: chronic obstructive pulmonary disease (COPD), asthma, heart failure, diabetes, or kidney disease.  A condition that increases the risk of breathing in (aspirating) mucus and other fluids from your mouth and nose.  A weakened body defense system (immune system).  Having had your spleen removed (splenectomy). The spleen is the organ that helps fight germs and infections.  Not cleaning your teeth and gums well (poor dental hygiene).  Using tobacco products.  Traveling to places where germs that cause pneumonia are present or being near certain animals or animal habitats that could have germs that cause pneumonia.  What are the signs or symptoms?  Symptoms of this condition include:  A dry cough or a wet (productive) cough.  A fever, sweating, or chills.  Chest pain, especially when breathing deeply or coughing.  Fast breathing, difficulty breathing, or shortness of breath.  Tiredness (fatigue) and muscle aches.  How is this diagnosed?    This condition may be diagnosed based on your medical  history or a physical exam. You may also have tests, including:  Imaging, such as a chest X-ray or lung ultrasound.  Tests of:  The level of oxygen and other gases in your blood.  Mucus from your lungs (sputum).  Fluid around your lungs (pleural fluid).  Your urine.  How is this treated?  Treatment for this condition depends on many factors, such as the cause of your pneumonia, your medicines, and other medical conditions that you have.  For most adults, pneumonia may be treated at home. In some cases, treatment must happen in a hospital and may include:  Medicines that are given by mouth (orally) or through an IV, including:  Antibiotic medicines, if bacteria caused the pneumonia.  Medicines that kill viruses (antiviral medicines), if a virus caused the pneumonia.  Oxygen therapy.  Severe pneumonia, although rare, may require the following treatments:  Mechanical ventilation.This procedure uses a machine to help you breathe if you cannot breathe well on your own or maintain a safe level of blood oxygen.  Thoracentesis. This procedure removes any buildup of pleural fluid to help with breathing.  Follow these instructions at home:    Medicines  Take over-the-counter and prescription medicines only as told by your health care provider.  Take cough medicine only if you have trouble sleeping. Cough medicine can prevent your body from removing mucus from your lungs.  If you were prescribed antibiotics, take them as told by your health care provider. Do not stop taking the antibiotic even if you start to feel better.  Lifestyle  Do not drink alcohol.  Do not use any products that contain nicotine or tobacco. These products include cigarettes, chewing tobacco, and vaping devices, such as e-cigarettes. If you need help quitting, ask your health care provider.  Eat a healthy diet. This includes plenty of vegetables, fruits, whole grains, low-fat dairy products, and lean protein.  General instructions  Rest a lot and  get at least 8 hours of sleep each night.  Sleep in a partly upright position at night. Place a few pillows under your head or sleep in a reclining chair.  Return to your normal activities as told by your health care provider. Ask your health care provider what activities are safe for you.  Drink enough fluid to keep your urine pale yellow. This helps to thin the mucus in your lungs.  If your throat is sore, gargle with a mixture of salt and water 3-4 times a day or as needed. To make salt water, completely dissolve -1 tsp (3-6 g) of salt in 1 cup (237 mL) of warm water.  Keep all follow-up visits.  How is this prevented?  You can lower your risk of developing community-acquired pneumonia by:  Getting the pneumonia vaccine. There are different types and schedules of pneumonia vaccines. Ask your health care provider which option is best for you. Consider getting the pneumonia vaccine if:  You are older than 65 years of age.  You are 78-57 years of age and are receiving cancer treatment, have chronic lung disease, or have other medical conditions that affect your immune system. Ask your health care provider if this applies to you.  Getting your influenza vaccine every year. Ask your health care provider which type of vaccine is best for you.  Getting regular dental checkups.  Washing your hands often with soap and water for at least 20 seconds. If soap and water are not available, use hand sanitizer.  Contact a health care provider if:  You have a fever.  You have trouble sleeping because you cannot control your cough with cough medicine.  Get help right away if:  Your shortness of breath becomes worse.  Your chest pain increases.  Your sickness becomes worse, especially if you are an older adult or have a weak immune system.  You cough up blood.  These symptoms may be an emergency. Get help right away. Call 911.  Do not wait to see if the symptoms will go away.  Do not drive yourself to the  hospital.  Summary  Pneumonia is an infection of the lungs.  Community-acquired pneumonia develops in people who have not been in the hospital. It can be caused by bacteria, viruses, or fungi.  This condition may be treated with antibiotics or antiviral medicines.  Severe pneumonia may require a hospital stay and treatment to help with breathing.  This information is not intended to replace advice given to you by your health care provider. Make sure you discuss any questions you have with your health care provider.  Document Revised: 03/19/2021 Document Reviewed: 03/19/2021  Elsevier Patient Education  2024 [employer].

## 2023-02-04 NOTE — Assessment & Plan Note (Signed)
 Well controlled, no changes to meds. Encouraged heart healthy diet such as the DASH diet and exercise as tolerated.

## 2023-02-04 NOTE — Telephone Encounter (Signed)
 Copied from CRM [id number]. Topic: Clinical - Prescription Issue >> Feb 04, 2023 11:43 AM Cherylynn B wrote: Reason for CRM: Patient states Dr Antonio Meth wanted a list of the meds she was prescribed at non-[facility] facility for pneumonia. 01/04/23: Amox-Clav 875-125mg  tablets, Azelastine 0.1% nasal 200sp, Azithromycin  250mg  tablets six pack, Promethazine -DM Syrup 10mg ; 01/28/23: Albuterol  HSA-INH 8.5gm, Levofloxacin 750mg  tablet, Prednisone  20mg  tablet **Callback [phone]

## 2023-02-04 NOTE — Progress Notes (Signed)
 yvonn  Established Patient Office Visit  Subjective   Patient ID: Molly Klein, female    DOB: [birth year]  Age: 63 y.o. MRN: [id number]  Chief Complaint  Patient presents with   Hypertension   Hyperlipidemia   Diabetes   Follow-up    HPI Discussed the use of AI scribe software for clinical note transcription with the patient, who gave verbal consent to proceed.  History of Present Illness   The patient, with a history of asthma and diabetes, presents with a recent diagnosis of pneumonia. She initially developed symptoms of coughing and wetness on her shirt during a Thanksgiving trip to [place] . Upon returning, she sought care at an urgent care center where she was diagnosed with pneumonia based on an x-ray and prescribed prednisone  and an unidentified medication. Her symptoms improved with treatment, and she stopped coughing.  However, on Christmas Eve, she visited a casino where she was exposed to smoke. Following this exposure, she developed hoarseness, chest pain, and felt drained. The next day, she was unable to speak due to hoarseness and had persistent chest pain. She returned to urgent care where another x-ray was performed. She was informed that her lungs were infected, her asthma had flared up, and she had pneumonia again. She was prescribed prednisone , an unidentified antibiotic, an albuterol  inhaler, and Flonase  nasal spray. She completed the course of medications but continues to feel sluggish and have back pain. She reports that the pneumonia was located in the lower part of one lung, but she is unsure which side.      Patient Active Problem List   Diagnosis Date Noted   Skin fissures 05/13/2022   Acute non-recurrent pansinusitis 02/20/2022   Chronic respiratory failure with hypoxia (HCC) 01/09/2022   Deep venous thrombosis (HCC) 10/12/2020   Vaginal discharge 10/12/2020   Viral hepatitis C 10/12/2020   Pharyngitis 09/06/2020   Type 2 diabetes mellitus with  hyperglycemia, without long-term current use of insulin  (HCC) 03/28/2020   Chronic hepatitis (HCC) 03/28/2020   Hyperlipidemia associated with type 2 diabetes mellitus (HCC) 03/28/2020   Cervical spinal stenosis 07/20/2018   Spinal stenosis of cervical region 06/14/2018   Colon cancer screening 04/07/2018   Sleep apnea 04/07/2018   Morbid obesity with body mass index (BMI) of 40.0 to 49.9 (HCC) 04/07/2018   Grief counseling 03/17/2018   Pre-op evaluation 03/16/2018   Protrusion of cervical intervertebral disc 01/10/2018   Right wrist injury 08/26/2014   Left knee pain 08/26/2014   Severe obesity (BMI >= 40) (HCC) 03/12/2014   Popliteal pain 12/20/2013   Contusion of left calf 12/13/2013   Acute bronchitis with asthma with acute exacerbation 05/15/2013   Bruising 10/21/2012   History of DVT of lower extremity 10/21/2012   Acute thoracic back pain 09/28/2012   Sinus congestion 04/01/2012   Solitary pulmonary nodule 10/07/2011   Lichenoid keratosis 10/06/2011   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   Nipple discharge in female, right 02/04/2011   Essential hypertension 02/06/2010   STRESS INCONTINENCE 04/22/2009   UTI 10/24/2008   PATELLAR DISLOCATION, RIGHT 10/24/2008   HEPATITIS C 10/08/2006   SLEEP APNEA, OBSTRUCTIVE 10/08/2006   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   Mild persistent asthma 10/08/2006   GERD 10/08/2006   OVERACTIVE BLADDER 10/08/2006   FIBROCYSTIC BREAST DISEASE 10/08/2006   Past Medical History:  Diagnosis Date   Asthma    Bruising 10/21/2012   Cervical spinal stenosis    L4-5   DVT (deep venous thrombosis) (HCC)  GERD (gastroesophageal reflux disease)    Hepatitis C    Treated and cured per pt   History of DVT of lower extremity 10/21/2012   Hypertension    Knee pain    left    Neuromuscular disorder (HCC)    neuropathy lt arm   Nipple discharge    right breast   OSA (obstructive sleep apnea)    Past Surgical History:  Procedure  Laterality Date   ABDOMINAL HYSTERECTOMY     ANTERIOR CERVICAL DECOMP/DISCECTOMY FUSION N/A 07/20/2018   Procedure: c4-5ANTERIOR CERVICAL DECOMPRESSION/DISCECTOMY FUSION, ALLOGRAFT & PLATE;  Surgeon: Barbarann Oneil BROCKS, MD;  Location: [facility] OR;  Service: Orthopedics;  Laterality: N/A;   BREAST BIOPSY  02/06/2011   Procedure: BREAST BIOPSY;  Surgeon: Krystal CHRISTELLA Spinner, MD;  Location: [facility];  Service: General;  Laterality: Right;  right breast biopsy   CHOLECYSTECTOMY     DILATION AND CURETTAGE OF UTERUS     FRACTURE SURGERY     fx lt arm   Nipple repair     PILONIDAL CYST EXCISION     right knee surgery     TONSILLECTOMY     Social History   Tobacco Use   Smoking status: Former    Current packs/day: 0.00    Types: Cigarettes    Start date: 02/02/1970    Quit date: 02/03/1975    Years since quitting: 48.0   Smokeless tobacco: Never   Tobacco comments:    1 pack per week  Vaping Use   Vaping status: Never Used  Substance Use Topics   Alcohol use: No    Alcohol/week: 0.0 standard drinks of alcohol   Drug use: No   Social History   Socioeconomic History   Marital status: Widowed    Spouse name: Not on file   Number of children: 0   Years of education: Not on file   Highest education level: GED or equivalent  Occupational History   Occupation: retired    Associate Professor: DISABLED  Tobacco Use   Smoking status: Former    Current packs/day: 0.00    Types: Cigarettes    Start date: 02/02/1970    Quit date: 02/03/1975    Years since quitting: 48.0   Smokeless tobacco: Never   Tobacco comments:    1 pack per week  Vaping Use   Vaping status: Never Used  Substance and Sexual Activity   Alcohol use: No    Alcohol/week: 0.0 standard drinks of alcohol   Drug use: No   Sexual activity: Yes    Partners: Male  Other Topics Concern   Not on file  Social History Narrative   No children - lives alone   Recovering alcoholic   Enjoys gambling, traveling and riding motorcycles    Sister lives nearby - very close   Social Drivers of Health   Financial Resource Strain: Low Risk  (01/28/2023)   Overall Financial Resource Strain (CARDIA)    Difficulty of Paying Living Expenses: Not very hard  Food Insecurity: No Food Insecurity (01/28/2023)   Hunger Vital Sign    Worried About Running Out of Food in the Last Year: Never true    Ran Out of Food in the Last Year: Never true  Transportation Needs: No Transportation Needs (01/28/2023)   PRAPARE - Administrator, Civil Service (Medical): No    Lack of Transportation (Non-Medical): No  Physical Activity: Insufficiently Active (10/12/2020)   Exercise Vital Sign    Days of  Exercise per Week: 7 days    Minutes of Exercise per Session: 10 min  Stress: No Stress Concern Present (01/28/2023)   [employer] of Occupational Health - Occupational Stress Questionnaire    Feeling of Stress : Not at all  Social Connections: Unknown (01/28/2023)   Social Connection and Isolation Panel [NHANES]    Frequency of Communication with Friends and Family: Once a week    Frequency of Social Gatherings with Friends and Family: Patient declined    Attends Religious Services: Never    Database Administrator or Organizations: No    Attends Engineer, Structural: Not on file    Marital Status: Widowed  Intimate Partner Violence: Not At Risk (10/12/2020)   Humiliation, Afraid, Rape, and Kick questionnaire    Fear of Current or Ex-Partner: No    Emotionally Abused: No    Physically Abused: No    Sexually Abused: No   Family Status  Relation Name Status   Mother  Deceased   Father  Deceased   Sister  (Not Specified)   MGM  (Not Specified)   Niece  Alive  No partnership data on file   Family History  Problem Relation Age of Onset   Heart attack Mother        mi in 59s   Alcohol abuse Mother        cirrhosis of liver   Heart attack Father        MI in 64s   Cancer Sister        pt unaware of what kind    Rectal cancer Maternal Grandmother    Cancer Maternal Grandmother        breast   Heart attack Niece    Allergies  Allergen Reactions   Codeine Nausea And Vomiting   Interferon Beta-1a Nausea And Vomiting and Other (See Comments)    Severe flu-like symptoms Symptoms expected   Sulfonamide Derivatives Nausea Only      Review of Systems  Constitutional:  Positive for malaise/fatigue. Negative for fever.  HENT:  Negative for congestion.   Eyes:  Negative for blurred vision.  Respiratory:  Negative for cough and shortness of breath.   Cardiovascular:  Negative for chest pain, palpitations and leg swelling.  Gastrointestinal:  Negative for abdominal pain, blood in stool, nausea and vomiting.  Genitourinary:  Negative for dysuria and frequency.  Musculoskeletal:  Negative for back pain and falls.  Skin:  Negative for rash.  Neurological:  Negative for dizziness, loss of consciousness and headaches.  Endo/Heme/Allergies:  Negative for environmental allergies.  Psychiatric/Behavioral:  Negative for depression. The patient is not nervous/anxious.       Objective:     BP 124/70 (BP Location: Left Arm, Patient Position: Sitting, Cuff Size: Large)   Pulse 100   Temp 97.8 F (36.6 C) (Oral)   Resp 18   Ht 5' 3 (1.6 m)   Wt 244 lb 12.8 oz (111 kg)   SpO2 96%   BMI 43.36 kg/m  BP Readings from Last 3 Encounters:  02/04/23 124/70  08/03/22 130/80  02/19/22 130/70   Wt Readings from Last 3 Encounters:  02/04/23 244 lb 12.8 oz (111 kg)  08/03/22 251 lb 6.4 oz (114 kg)  02/19/22 249 lb (112.9 kg)   SpO2 Readings from Last 3 Encounters:  02/04/23 96%  08/03/22 96%  02/19/22 96%      Physical Exam Vitals and nursing note reviewed.  Constitutional:      General:  She is not in acute distress.    Appearance: Normal appearance. She is well-developed.  HENT:     Head: Normocephalic and atraumatic.  Eyes:     General: No scleral icterus.       Right eye: No discharge.         Left eye: No discharge.  Cardiovascular:     Rate and Rhythm: Normal rate and regular rhythm.     Heart sounds: No murmur heard. Pulmonary:     Effort: Pulmonary effort is normal. No respiratory distress.     Breath sounds: Normal breath sounds.  Musculoskeletal:        General: Normal range of motion.     Cervical back: Normal range of motion and neck supple.     Right lower leg: No edema.     Left lower leg: No edema.  Skin:    General: Skin is warm and dry.  Neurological:     Mental Status: She is alert and oriented to person, place, and time.  Psychiatric:        Mood and Affect: Mood normal.        Behavior: Behavior normal.        Thought Content: Thought content normal.        Judgment: Judgment normal.    Diabetic Foot Exam - Simple   Simple Foot Form Diabetic Foot exam was performed with the following findings: Yes 02/04/2023  9:33 AM  Visual Inspection No deformities, no ulcerations, no other skin breakdown bilaterally: Yes Sensation Testing Intact to touch and monofilament testing bilaterally: Yes Pulse Check Posterior Tibialis and Dorsalis pulse intact bilaterally: Yes Comments       No results found for any visits on 02/04/23.  Last CBC Lab Results  Component Value Date   WBC 3.5 (L) 11/12/2022   HGB 14.2 11/12/2022   HCT 42.2 11/12/2022   MCV 90.0 11/12/2022   MCH 29.5 07/14/2018   RDW 15.1 11/12/2022   PLT 201.0 11/12/2022   Last metabolic panel Lab Results  Component Value Date   GLUCOSE 102 (H) 11/12/2022   NA 140 11/12/2022   K 4.3 11/12/2022   CL 102 11/12/2022   CO2 32 11/12/2022   BUN 13 11/12/2022   CREATININE 0.75 11/12/2022   GFR 84.09 11/12/2022   CALCIUM  9.6 11/12/2022   PROT 6.6 11/12/2022   ALBUMIN 4.1 11/12/2022   BILITOT 0.5 11/12/2022   ALKPHOS 69 11/12/2022   AST 14 11/12/2022   ALT 25 11/12/2022   ANIONGAP 10 07/21/2018   Last lipids Lab Results  Component Value Date   CHOL 138 11/12/2022   HDL 50.60  11/12/2022   LDLCALC 74 11/12/2022   TRIG 66.0 11/12/2022   CHOLHDL 3 11/12/2022   Last hemoglobin A1c Lab Results  Component Value Date   HGBA1C 5.5 08/03/2022   Last thyroid  functions Lab Results  Component Value Date   TSH 1.87 08/03/2022   Last vitamin D No results found for: 25OHVITD2, 25OHVITD3, VD25OH Last vitamin B12 and Folate No results found for: VITAMINB12, FOLATE    The 10-year ASCVD risk score (Arnett DK, et al., 2019) is: 13.5%    Assessment & Plan:   Problem List Items Addressed This Visit       Unprioritized   Severe obesity (BMI >= 40) (HCC)   Type 2 diabetes mellitus with hyperglycemia, without long-term current use of insulin  (HCC) - Primary   hgba1c to be checked,  minimize simple carbs. Increase exercise as tolerated.  Continue current meds       Relevant Orders   Comprehensive metabolic panel   Hemoglobin A1c   Microalbumin / creatinine urine ratio   Hyperlipidemia associated with type 2 diabetes mellitus (HCC)   Tolerating statin, encouraged heart healthy diet, avoid trans fats, minimize simple carbs and saturated fats. Increase exercise as tolerated       Relevant Orders   Lipid panel   Comprehensive metabolic panel   Essential hypertension   Well controlled, no changes to meds. Encouraged heart healthy diet such as the DASH diet and exercise as tolerated.        Relevant Orders   Lipid panel   CBC with Differential/Platelet   Comprehensive metabolic panel   Other Visit Diagnoses       Pneumonia due to infectious organism, unspecified laterality, unspecified part of lung       Relevant Orders   DG Chest 2 View     Assessment and Plan    Pneumonia Recurrent pneumonia was initially diagnosed post-Thanksgiving and recurred post-Christmas. Despite completing two courses of antibiotics and prednisone , she remains symptomatic with hoarseness, chest pain, and severe fatigue. An x-ray indicated infection in the lower lung.  We emphasized the importance of completing antibiotics courses and discussed that recovery might require time, further imaging, and possibly stronger antibiotics. We will order a chest x-ray stat, review the results before she leaves, confirm antibiotics used in both treatments to avoid repetition, and consider stronger or alternative antibiotics based on x-ray results and previous treatments.  Asthma Her asthma exacerbation, likely triggered by smoke exposure in a casino, presented with severe hoarseness and difficulty speaking. Treatment included an albuterol  inhaler and prednisone . We advised avoiding smoke exposure to prevent future exacerbations. She should continue the albuterol  inhaler as needed, monitor for further asthma exacerbations, and avoid smoke exposure.  Diabetes Mellitus Her diabetes management is complicated by recent illness and prednisone  use, which can elevate blood glucose levels. She reports an adequate supply of Ozempic . We discussed the importance of closely monitoring blood glucose levels due to potential fluctuations from prednisone . She will continue Ozempic  as prescribed and monitor blood glucose levels closely, being advised on the potential fluctuations due to prednisone .  Follow-up She should follow up with her primary care provider after the x-ray results, call or message with the names of antibiotics used in previous treatments, and monitor symptoms, seeking urgent care if her condition worsens.        Return in about 6 months (around 08/04/2023), or if symptoms worsen or fail to improve, for annual exam, fasting.    Amrutha Avera R Lowne Chase, DO

## 2023-02-04 NOTE — Assessment & Plan Note (Signed)
 hgba1c to be checked, minimize simple carbs. Increase exercise as tolerated. Continue current meds

## 2023-02-05 LAB — MICROALBUMIN / CREATININE URINE RATIO
Creatinine,U: 200.9 mg/dL
Microalb Creat Ratio: 0.6 mg/g (ref 0.0–30.0)
Microalb, Ur: 1.2 mg/dL (ref 0.0–1.9)

## 2023-05-10 ENCOUNTER — Other Ambulatory Visit: Payer: Self-pay | Admitting: Family Medicine

## 2023-05-10 DIAGNOSIS — E1165 Type 2 diabetes mellitus with hyperglycemia: Secondary | ICD-10-CM

## 2023-07-13 ENCOUNTER — Other Ambulatory Visit: Payer: Self-pay | Admitting: Family Medicine

## 2023-07-13 DIAGNOSIS — E1165 Type 2 diabetes mellitus with hyperglycemia: Secondary | ICD-10-CM

## 2023-08-05 ENCOUNTER — Encounter: Payer: Medicare HMO | Admitting: Family Medicine

## 2023-08-09 ENCOUNTER — Ambulatory Visit (INDEPENDENT_AMBULATORY_CARE_PROVIDER_SITE_OTHER): Admitting: Family Medicine

## 2023-08-09 ENCOUNTER — Encounter: Payer: Self-pay | Admitting: Family Medicine

## 2023-08-09 VITALS — BP 120/84 | HR 82 | Temp 97.9°F | Resp 18 | Ht 63.0 in | Wt 255.2 lb

## 2023-08-09 DIAGNOSIS — Z0000 Encounter for general adult medical examination without abnormal findings: Secondary | ICD-10-CM

## 2023-08-09 DIAGNOSIS — E1169 Type 2 diabetes mellitus with other specified complication: Secondary | ICD-10-CM | POA: Diagnosis not present

## 2023-08-09 DIAGNOSIS — I10 Essential (primary) hypertension: Secondary | ICD-10-CM

## 2023-08-09 DIAGNOSIS — E1165 Type 2 diabetes mellitus with hyperglycemia: Secondary | ICD-10-CM | POA: Diagnosis not present

## 2023-08-09 DIAGNOSIS — L659 Nonscarring hair loss, unspecified: Secondary | ICD-10-CM

## 2023-08-09 DIAGNOSIS — E785 Hyperlipidemia, unspecified: Secondary | ICD-10-CM

## 2023-08-09 MED ORDER — OZEMPIC (2 MG/DOSE) 8 MG/3ML ~~LOC~~ SOPN: 2.0000 mg | PEN_INJECTOR | SUBCUTANEOUS | 1 refills | Status: DC

## 2023-08-09 NOTE — Assessment & Plan Note (Signed)
 Encourage heart healthy diet such as MIND or DASH diet, increase exercise, avoid trans fats, simple carbohydrates and processed foods, consider a krill or fish or flaxseed oil cap daily.

## 2023-08-09 NOTE — Assessment & Plan Note (Signed)
 Well controlled, no changes to meds. Encouraged heart healthy diet such as the DASH diet and exercise as tolerated.

## 2023-08-09 NOTE — Patient Instructions (Signed)
 Preventive Care 16-65 Years Old, Female  Preventive care refers to lifestyle choices and visits with your health care provider that can promote health and wellness. Preventive care visits are also called wellness exams.  What can I expect for my preventive care visit?  Counseling  Your health care provider may ask you questions about your:  Medical history, including:  Past medical problems.  Family medical history.  Pregnancy history.  Current health, including:  Menstrual cycle.  Method of birth control.  Emotional well-being.  Home life and relationship well-being.  Sexual activity and sexual health.  Lifestyle, including:  Alcohol, nicotine or tobacco, and drug use.  Access to firearms.  Diet, exercise, and sleep habits.  Work and work Astronomer.  Sunscreen use.  Safety issues such as seatbelt and bike helmet use.  Physical exam  Your health care provider will check your:  Height and weight. These may be used to calculate your BMI (body mass index). BMI is a measurement that tells if you are at a healthy weight.  Waist circumference. This measures the distance around your waistline. This measurement also tells if you are at a healthy weight and may help predict your risk of certain diseases, such as type 2 diabetes and high blood pressure.  Heart rate and blood pressure.  Body temperature.  Skin for abnormal spots.  What immunizations do I need?    Vaccines are usually given at various ages, according to a schedule. Your health care provider will recommend vaccines for you based on your age, medical history, and lifestyle or other factors, such as travel or where you work.  What tests do I need?  Screening  Your health care provider may recommend screening tests for certain conditions. This may include:  Lipid and cholesterol levels.  Diabetes screening. This is done by checking your blood sugar (glucose) after you have not eaten for a while (fasting).  Pelvic exam and Pap test.  Hepatitis B test.  Hepatitis C  test.  HIV (human immunodeficiency virus) test.  STI (sexually transmitted infection) testing, if you are at risk.  Lung cancer screening.  Colorectal cancer screening.  Mammogram. Talk with your health care provider about when you should start having regular mammograms. This may depend on whether you have a family history of breast cancer.  BRCA-related cancer screening. This may be done if you have a family history of breast, ovarian, tubal, or peritoneal cancers.  Bone density scan. This is done to screen for osteoporosis.  Talk with your health care provider about your test results, treatment options, and if necessary, the need for more tests.  Follow these instructions at home:  Eating and drinking    Eat a diet that includes fresh fruits and vegetables, whole grains, lean protein, and low-fat dairy products.  Take vitamin and mineral supplements as recommended by your health care provider.  Do not drink alcohol if:  Your health care provider tells you not to drink.  You are pregnant, may be pregnant, or are planning to become pregnant.  If you drink alcohol:  Limit how much you have to 0-1 drink a day.  Know how much alcohol is in your drink. In the [place] one drink equals one 12 oz bottle of beer (355 mL), one 5 oz glass of wine (148 mL), or one 1 oz glass of hard liquor (44 mL).  Lifestyle  Brush your teeth every morning and night with fluoride toothpaste. Floss one time each day.  Exercise for at least  30 minutes 5 or more days each week.  Do not use any products that contain nicotine or tobacco. These products include cigarettes, chewing tobacco, and vaping devices, such as e-cigarettes. If you need help quitting, ask your health care provider.  Do not use drugs.  If you are sexually active, practice safe sex. Use a condom or other form of protection to prevent STIs.  If you do not wish to become pregnant, use a form of birth control. If you plan to become pregnant, see your health care provider for a  prepregnancy visit.  Take aspirin only as told by your health care provider. Make sure that you understand how much to take and what form to take. Work with your health care provider to find out whether it is safe and beneficial for you to take aspirin daily.  Find healthy ways to manage stress, such as:  Meditation, yoga, or listening to music.  Journaling.  Talking to a trusted person.  Spending time with friends and family.  Minimize exposure to UV radiation to reduce your risk of skin cancer.  Safety  Always wear your seat belt while driving or riding in a vehicle.  Do not drive:  If you have been drinking alcohol. Do not ride with someone who has been drinking.  When you are tired or distracted.  While texting.  If you have been using any mind-altering substances or drugs.  Wear a helmet and other protective equipment during sports activities.  If you have firearms in your house, make sure you follow all gun safety procedures.  Seek help if you have been physically or sexually abused.  What's next?  Visit your health care provider once a year for an annual wellness visit.  Ask your health care provider how often you should have your eyes and teeth checked.  Stay up to date on all vaccines.  This information is not intended to replace advice given to you by your health care provider. Make sure you discuss any questions you have with your health care provider.  Document Revised: 07/17/2020 Document Reviewed: 07/17/2020  Elsevier Patient Education  2024 [employer].

## 2023-08-09 NOTE — Progress Notes (Signed)
 Established Patient Office Visit  Subjective   Patient ID: Molly Klein, female    DOB: 1960/05/31  Age: 63 y.o. MRN: [id number]  Chief Complaint  Patient presents with   Annual Exam    Pt states not fasting     HPI Discussed the use of AI scribe software for clinical note transcription with the patient, who gave verbal consent to proceed.  History of Present Illness Molly Klein is a 63 year old female who presents for an annual physical exam.  Her current weight is 255 pounds, and she notes that weight loss is progressing slowly despite her efforts. She is on Ozempic  at a dose of 2 mg, which is the highest dose. She is interested in losing weight and mentions walking 7,000 steps in one day, which she found exhausting due to the heat. She also visits farmer's markets to purchase fruits and vegetables.  She has an upcoming eye appointment in October. She mentions a previous eye test where she was tested for 'spots' in her eyes and was told she did not pass. She expresses concern about a sty and thinning hair, particularly around her hairline. She is unsure if she is consuming enough protein, noting that her meals often consist of small portions such as chicken salad and watermelon.  No stomach or joint problems. She mentions a skin tag that she believes is hereditary, as her family members also have them. She has had skin tags removed in the past by a dermatologist, but they have returned.  She discusses her social activities, including visiting a friend's house in [place] and going to the state farmer's market. She also shares a personal story about a relationship with a man she has known for 18 years, which recently ended due to infidelity.                                Patient Active Problem List   Diagnosis Date Noted   Skin fissures 05/13/2022   Acute non-recurrent pansinusitis 02/20/2022   Chronic respiratory failure with hypoxia (HCC)  01/09/2022   Deep venous thrombosis (HCC) 10/12/2020   Vaginal discharge 10/12/2020   Viral hepatitis C 10/12/2020   Pharyngitis 09/06/2020   Type 2 diabetes mellitus with hyperglycemia, without long-term current use of insulin  (HCC) 03/28/2020   Chronic hepatitis (HCC) 03/28/2020   Hyperlipidemia associated with type 2 diabetes mellitus (HCC) 03/28/2020   Cervical spinal stenosis 07/20/2018   Spinal stenosis of cervical region 06/14/2018   Colon cancer screening 04/07/2018   Sleep apnea 04/07/2018   Morbid obesity with body mass index (BMI) of 40.0 to 49.9 (HCC) 04/07/2018   Grief counseling 03/17/2018   Pre-op evaluation 03/16/2018   Protrusion of cervical intervertebral disc 01/10/2018   Right wrist injury 08/26/2014   Left knee pain 08/26/2014   Severe obesity (BMI >= 40) (HCC) 03/12/2014   Popliteal pain 12/20/2013   Contusion of left calf 12/13/2013   Acute bronchitis with asthma with acute exacerbation 05/15/2013   Bruising 10/21/2012   History of DVT of lower extremity 10/21/2012   Acute thoracic back pain 09/28/2012   Sinus congestion 04/01/2012   Solitary pulmonary nodule 10/07/2011   Lichenoid keratosis 10/06/2011   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   Nipple discharge in female, right 02/04/2011   Essential hypertension 02/06/2010   STRESS INCONTINENCE 04/22/2009   UTI 10/24/2008   PATELLAR DISLOCATION, RIGHT 10/24/2008   HEPATITIS C  10/08/2006   SLEEP APNEA, OBSTRUCTIVE 10/08/2006   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   Mild persistent asthma 10/08/2006   GERD 10/08/2006   OVERACTIVE BLADDER 10/08/2006   FIBROCYSTIC BREAST DISEASE 10/08/2006   Past Medical History:  Diagnosis Date   Asthma    Bruising 10/21/2012   Cervical spinal stenosis    L4-5   DVT (deep venous thrombosis) (HCC)    GERD (gastroesophageal reflux disease)    Hepatitis C    Treated and cured per pt   History of DVT of lower extremity 10/21/2012   Hypertension    Knee pain     left    Neuromuscular disorder (HCC)    neuropathy lt arm   Nipple discharge    right breast   OSA (obstructive sleep apnea)    Past Surgical History:  Procedure Laterality Date   ABDOMINAL HYSTERECTOMY     ANTERIOR CERVICAL DECOMP/DISCECTOMY FUSION N/A 07/20/2018   Procedure: c4-5ANTERIOR CERVICAL DECOMPRESSION/DISCECTOMY FUSION, ALLOGRAFT & PLATE;  Surgeon: Barbarann Oneil BROCKS, MD;  Location: [facility] OR;  Service: Orthopedics;  Laterality: N/A;   BREAST BIOPSY  02/06/2011   Procedure: BREAST BIOPSY;  Surgeon: Krystal CHRISTELLA Spinner, MD;  Location: [facility];  Service: General;  Laterality: Right;  right breast biopsy   CHOLECYSTECTOMY     DILATION AND CURETTAGE OF UTERUS     FRACTURE SURGERY     fx lt arm   Nipple repair     PILONIDAL CYST EXCISION     right knee surgery     TONSILLECTOMY     Social History   Tobacco Use   Smoking status: Former    Current packs/day: 0.00    Types: Cigarettes    Start date: 02/02/1970    Quit date: 02/03/1975    Years since quitting: 48.5   Smokeless tobacco: Never   Tobacco comments:    1 pack per week  Vaping Use   Vaping status: Never Used  Substance Use Topics   Alcohol use: No    Alcohol/week: 0.0 standard drinks of alcohol   Drug use: No   Social History   Socioeconomic History   Marital status: Widowed    Spouse name: Not on file   Number of children: 0   Years of education: Not on file   Highest education level: GED or equivalent  Occupational History   Occupation: retired    Associate Professor: DISABLED  Tobacco Use   Smoking status: Former    Current packs/day: 0.00    Types: Cigarettes    Start date: 02/02/1970    Quit date: 02/03/1975    Years since quitting: 48.5   Smokeless tobacco: Never   Tobacco comments:    1 pack per week  Vaping Use   Vaping status: Never Used  Substance and Sexual Activity   Alcohol use: No    Alcohol/week: 0.0 standard drinks of alcohol   Drug use: No   Sexual activity: Yes    Partners: Male   Other Topics Concern   Not on file  Social History Narrative   No children - lives alone   Recovering alcoholic   Enjoys gambling, traveling and riding motorcycles   Sister lives nearby - very close   Social Drivers of Health   Financial Resource Strain: Low Risk  (08/04/2023)   Overall Financial Resource Strain (CARDIA)    Difficulty of Paying Living Expenses: Not very hard  Food Insecurity: No Food Insecurity (08/04/2023)   Hunger Vital Sign  Worried About Programme researcher, broadcasting/film/video in the Last Year: Never true    Ran Out of Food in the Last Year: Never true  Transportation Needs: No Transportation Needs (08/04/2023)   PRAPARE - Administrator, Civil Service (Medical): No    Lack of Transportation (Non-Medical): No  Physical Activity: Sufficiently Active (08/04/2023)   Exercise Vital Sign    Days of Exercise per Week: 7 days    Minutes of Exercise per Session: 30 min  Stress: No Stress Concern Present (08/04/2023)   [employer] of Occupational Health - Occupational Stress Questionnaire    Feeling of Stress: Not at all  Social Connections: Socially Isolated (08/04/2023)   Social Connection and Isolation Panel    Frequency of Communication with Friends and Family: More than three times a week    Frequency of Social Gatherings with Friends and Family: Once a week    Attends Religious Services: Never    Database administrator or Organizations: No    Attends Engineer, structural: Not on file    Marital Status: Widowed  Intimate Partner Violence: Not At Risk (10/12/2020)   Humiliation, Afraid, Rape, and Kick questionnaire    Fear of Current or Ex-Partner: No    Emotionally Abused: No    Physically Abused: No    Sexually Abused: No   Family Status  Relation Name Status   Mother  Deceased   Father  Deceased   Sister  (Not Specified)   MGM  (Not Specified)   Niece  Alive  No partnership data on file   Family History  Problem Relation Age of Onset   Heart  attack Mother        mi in 83s   Alcohol abuse Mother        cirrhosis of liver   Heart attack Father        MI in 36s   Cancer Sister        pt unaware of what kind   Rectal cancer Maternal Grandmother    Cancer Maternal Grandmother        breast   Heart attack Niece    Allergies  Allergen Reactions   Codeine Nausea And Vomiting   Interferon Beta-1a Nausea And Vomiting and Other (See Comments)    Severe flu-like symptoms Symptoms expected   Sulfonamide Derivatives Nausea Only      Review of Systems  Constitutional:  Negative for fever and malaise/fatigue.  HENT:  Negative for congestion.   Eyes:  Negative for blurred vision.  Respiratory:  Negative for cough and shortness of breath.   Cardiovascular:  Negative for chest pain, palpitations and leg swelling.  Gastrointestinal:  Negative for abdominal pain, blood in stool, nausea and vomiting.  Genitourinary:  Negative for dysuria and frequency.  Musculoskeletal:  Negative for back pain and falls.  Skin:  Negative for rash.  Neurological:  Negative for dizziness, loss of consciousness and headaches.  Endo/Heme/Allergies:  Negative for environmental allergies.  Psychiatric/Behavioral:  Negative for depression. The patient is not nervous/anxious.       Objective:     BP 120/84 (BP Location: Left Arm, Patient Position: Sitting, Cuff Size: Large)   Pulse 82   Temp 97.9 F (36.6 C) (Oral)   Resp 18   Ht 5' 3 (1.6 m)   Wt 255 lb 3.2 oz (115.8 kg)   SpO2 99%   BMI 45.21 kg/m  BP Readings from Last 3 Encounters:  08/09/23 120/84  02/04/23  124/70  08/03/22 130/80   Wt Readings from Last 3 Encounters:  08/09/23 255 lb 3.2 oz (115.8 kg)  02/04/23 244 lb 12.8 oz (111 kg)  08/03/22 251 lb 6.4 oz (114 kg)   SpO2 Readings from Last 3 Encounters:  08/09/23 99%  02/04/23 96%  08/03/22 96%      Physical Exam Vitals and nursing note reviewed.  Constitutional:      General: She is not in acute distress.     Appearance: Normal appearance. She is well-developed.  HENT:     Head: Normocephalic and atraumatic.     Right Ear: Tympanic membrane, ear canal and external ear normal. There is no impacted cerumen.     Left Ear: Tympanic membrane, ear canal and external ear normal. There is no impacted cerumen.     Nose: Nose normal.     Mouth/Throat:     Mouth: Mucous membranes are moist.     Pharynx: Oropharynx is clear. No oropharyngeal exudate or posterior oropharyngeal erythema.  Eyes:     General: No scleral icterus.       Right eye: No discharge.        Left eye: No discharge.     Conjunctiva/sclera: Conjunctivae normal.     Pupils: Pupils are equal, round, and reactive to light.  Neck:     Thyroid : No thyromegaly or thyroid  tenderness.     Vascular: No JVD.  Cardiovascular:     Rate and Rhythm: Normal rate and regular rhythm.     Heart sounds: Normal heart sounds. No murmur heard. Pulmonary:     Effort: Pulmonary effort is normal. No respiratory distress.     Breath sounds: Normal breath sounds.  Abdominal:     General: Bowel sounds are normal. There is no distension.     Palpations: Abdomen is soft. There is no mass.     Tenderness: There is no abdominal tenderness. There is no guarding or rebound.  Genitourinary:    Vagina: Normal.  Musculoskeletal:        General: Normal range of motion.     Cervical back: Normal range of motion and neck supple.     Right lower leg: No edema.     Left lower leg: No edema.  Lymphadenopathy:     Cervical: No cervical adenopathy.  Skin:    General: Skin is warm and dry.     Findings: No erythema or rash.  Neurological:     Mental Status: She is alert and oriented to person, place, and time.     Cranial Nerves: No cranial nerve deficit.     Deep Tendon Reflexes: Reflexes are normal and symmetric.  Psychiatric:        Mood and Affect: Mood normal.        Behavior: Behavior normal.        Thought Content: Thought content normal.        Judgment:  Judgment normal.      No results found for any visits on 08/09/23.  Last CBC Lab Results  Component Value Date   WBC 5.4 02/04/2023   HGB 15.7 (H) 02/04/2023   HCT 46.3 (H) 02/04/2023   MCV 90.2 02/04/2023   MCH 29.5 07/14/2018   RDW 15.1 02/04/2023   PLT 182.0 02/04/2023   Last metabolic panel Lab Results  Component Value Date   GLUCOSE 80 02/04/2023   NA 140 02/04/2023   K 3.8 02/04/2023   CL 99 02/04/2023   CO2 30 02/04/2023  BUN 15 02/04/2023   CREATININE 0.95 02/04/2023   GFR 63.22 02/04/2023   CALCIUM  9.6 02/04/2023   PROT 7.3 02/04/2023   ALBUMIN 4.3 02/04/2023   BILITOT 0.7 02/04/2023   ALKPHOS 63 02/04/2023   AST 12 02/04/2023   ALT 17 02/04/2023   ANIONGAP 10 07/21/2018   Last lipids Lab Results  Component Value Date   CHOL 195 02/04/2023   HDL 70.80 02/04/2023   LDLCALC 109 (H) 02/04/2023   TRIG 77.0 02/04/2023   CHOLHDL 3 02/04/2023   Last hemoglobin A1c Lab Results  Component Value Date   HGBA1C 5.8 02/04/2023   Last thyroid  functions Lab Results  Component Value Date   TSH 1.87 08/03/2022   Last vitamin D No results found for: 25OHVITD2, 25OHVITD3, VD25OH Last vitamin B12 and Folate No results found for: VITAMINB12, FOLATE    The 10-year ASCVD risk score (Arnett DK, et al., 2019) is: 16.5%    Assessment & Plan:   Problem List Items Addressed This Visit       Unprioritized   Severe obesity (BMI >= 40) (HCC)   Relevant Medications   Semaglutide , 2 MG/DOSE, (OZEMPIC , 2 MG/DOSE,) 8 MG/3ML SOPN   Type 2 diabetes mellitus with hyperglycemia, without long-term current use of insulin  (HCC) - Primary   Relevant Medications   Semaglutide , 2 MG/DOSE, (OZEMPIC , 2 MG/DOSE,) 8 MG/3ML SOPN   Other Relevant Orders   CBC with Differential/Platelet   Comprehensive metabolic panel with GFR   Lipid panel   Hemoglobin A1c   Microalbumin / creatinine urine ratio   Hyperlipidemia associated with type 2 diabetes mellitus (HCC)    Encourage heart healthy diet such as MIND or DASH diet, increase exercise, avoid trans fats, simple carbohydrates and processed foods, consider a krill or fish or flaxseed oil cap daily.        Relevant Medications   Semaglutide , 2 MG/DOSE, (OZEMPIC , 2 MG/DOSE,) 8 MG/3ML SOPN   Other Relevant Orders   CBC with Differential/Platelet   Comprehensive metabolic panel with GFR   Lipid panel   Essential hypertension   Well controlled, no changes to meds. Encouraged heart healthy diet such as the DASH diet and exercise as tolerated.        Relevant Orders   CBC with Differential/Platelet   Comprehensive metabolic panel with GFR   Lipid panel   Other Visit Diagnoses       Preventative health care         Hair loss       Relevant Orders   Thyroid  Panel With TSH     Assessment and Plan Assessment & Plan Obesity   She is on Ozempic  2 mg for weight loss, showing slow but steady progress, currently weighing 255 lbs. Non-surgical fat reduction methods are advised against due to ineffectiveness for obesity. She engages in physical activity but should avoid overexertion, especially in high temperatures. Dietary modifications were discussed to ensure adequate protein intake to support weight loss and prevent hair thinning. Continue Ozempic  2 mg, encourage regular physical activity within safe limits, and maintain dietary modifications.  Thinning hair   She reports thinning hair, possibly due to menopause or insufficient protein intake. Checking thyroid  function is suggested as a potential cause. Emphasize the importance of adequate protein intake to prevent further hair thinning. Check thyroid  function and ensure adequate protein intake in her diet.  Sty   She reports a sty on the upper eyelid, which she has been picking at. The sty appears to have resolved  on its own.  Skin tags   She has hereditary skin tags, previously removed by a dermatologist, and is aware of potential recurrence.  Consider referral to a dermatologist if skin tags become bothersome.    Return in about 6 months (around 02/09/2024), or if symptoms worsen or fail to improve, for hypertension, hyperlipidemia, diabetes II.    Cleon Thoma R Lowne Chase, DO

## 2023-08-10 ENCOUNTER — Other Ambulatory Visit

## 2023-08-10 ENCOUNTER — Ambulatory Visit: Payer: Self-pay | Admitting: Family Medicine

## 2023-08-10 LAB — CBC WITH DIFFERENTIAL/PLATELET
Basophils Absolute: 0.1 K/uL (ref 0.0–0.1)
Basophils Relative: 1.3 % (ref 0.0–3.0)
Eosinophils Absolute: 0.2 K/uL (ref 0.0–0.7)
Eosinophils Relative: 4.1 % (ref 0.0–5.0)
HCT: 42.8 % (ref 36.0–46.0)
Hemoglobin: 14.8 g/dL (ref 12.0–15.0)
Lymphocytes Relative: 32.4 % (ref 12.0–46.0)
Lymphs Abs: 1.3 K/uL (ref 0.7–4.0)
MCHC: 34.6 g/dL (ref 30.0–36.0)
MCV: 87.4 fl (ref 78.0–100.0)
Monocytes Absolute: 0.3 K/uL (ref 0.1–1.0)
Monocytes Relative: 7.3 % (ref 3.0–12.0)
Neutro Abs: 2.1 K/uL (ref 1.4–7.7)
Neutrophils Relative %: 54.9 % (ref 43.0–77.0)
Platelets: 210 K/uL (ref 150.0–400.0)
RBC: 4.89 Mil/uL (ref 3.87–5.11)
RDW: 15.4 % (ref 11.5–15.5)
WBC: 3.9 K/uL — ABNORMAL LOW (ref 4.0–10.5)

## 2023-08-10 LAB — LIPID PANEL
Cholesterol: 211 mg/dL — ABNORMAL HIGH (ref 0–200)
HDL: 51.2 mg/dL (ref >39.00)
LDL Cholesterol: 141 mg/dL — ABNORMAL HIGH (ref 0–99)
NonHDL: 159.52
Total CHOL/HDL Ratio: 4
Triglycerides: 95 mg/dL (ref 0.0–149.0)
VLDL: 19 mg/dL (ref 0.0–40.0)

## 2023-08-10 LAB — THYROID PANEL WITH TSH
Free Thyroxine Index: 2.6 (ref 1.4–3.8)
T3 Uptake: 24 % (ref 22–35)
T4, Total: 10.7 ug/dL (ref 5.1–11.9)
TSH: 2.19 m[IU]/L (ref 0.40–4.50)

## 2023-08-10 LAB — COMPREHENSIVE METABOLIC PANEL WITH GFR
ALT: 23 U/L (ref 0–35)
AST: 17 U/L (ref 0–37)
Albumin: 4.6 g/dL (ref 3.5–5.2)
Alkaline Phosphatase: 59 U/L (ref 39–117)
BUN: 12 mg/dL (ref 6–23)
CO2: 31 meq/L (ref 19–32)
Calcium: 9.2 mg/dL (ref 8.4–10.5)
Chloride: 101 meq/L (ref 96–112)
Creatinine, Ser: 0.84 mg/dL (ref 0.40–1.20)
GFR: 73.02 mL/min (ref >60.00)
Glucose, Bld: 85 mg/dL (ref 70–99)
Potassium: 3.9 meq/L (ref 3.5–5.1)
Sodium: 139 meq/L (ref 135–145)
Total Bilirubin: 0.5 mg/dL (ref 0.2–1.2)
Total Protein: 7.4 g/dL (ref 6.0–8.3)

## 2023-08-10 LAB — HEMOGLOBIN A1C: Hgb A1c MFr Bld: 5.7 % (ref 4.6–6.5)

## 2023-08-10 LAB — MICROALBUMIN / CREATININE URINE RATIO
Creatinine,U: 252.2 mg/dL
Microalb Creat Ratio: 3.7 mg/g (ref 0.0–30.0)
Microalb, Ur: 0.9 mg/dL (ref 0.0–1.9)

## 2023-08-10 NOTE — Telephone Encounter (Signed)
 No further action needed at this time.

## 2023-09-03 ENCOUNTER — Ambulatory Visit: Admitting: Podiatry

## 2023-09-07 ENCOUNTER — Ambulatory Visit: Admitting: Podiatry

## 2023-09-10 ENCOUNTER — Other Ambulatory Visit: Payer: Self-pay | Admitting: Family Medicine

## 2023-09-10 DIAGNOSIS — Z1231 Encounter for screening mammogram for malignant neoplasm of breast: Secondary | ICD-10-CM

## 2023-09-17 ENCOUNTER — Ambulatory Visit: Admitting: Podiatry

## 2023-09-17 ENCOUNTER — Encounter: Payer: Self-pay | Admitting: Podiatry

## 2023-09-17 ENCOUNTER — Ambulatory Visit
Admission: RE | Admit: 2023-09-17 | Discharge: 2023-09-17 | Disposition: A | Discharge status: Home / Self Care | Source: Ambulatory Visit | Attending: Family Medicine | Admitting: Family Medicine

## 2023-09-17 DIAGNOSIS — M25572 Pain in left ankle and joints of left foot: Secondary | ICD-10-CM

## 2023-09-17 DIAGNOSIS — Z1231 Encounter for screening mammogram for malignant neoplasm of breast: Secondary | ICD-10-CM

## 2023-09-17 MED ORDER — TRIAMCINOLONE ACETONIDE 10 MG/ML IJ SUSP
10.0000 mg | Freq: Once | INTRAMUSCULAR | Status: AC
  Administered 2023-09-17: 10 mg

## 2023-09-17 NOTE — Progress Notes (Signed)
 Patient presents with complaint of pain around the ankle along the top of the foot.  She says she went to [facility] and did a lot of walking and is flared up Dr. Tobie injected the ankle 9 or 10 months ago.   Physical exam:  General appearance: Pleasant, and in no acute distress. AOx3.  Vascular: Pedal pulses: DP 2/4 bilaterally, PT 2/4 bilaterally. Moderate edema lower legs bilaterally. Capillary fill time immedeiate..  Neurological: Light touch intact feet bilaterally.  Normal Achilles reflex bilaterally.  No clonus or spasticity noted.   Dermatologic:   Skin normal temperature bilaterally.  Skin normal color, tone, and texture bilaterally.   Musculoskeletal: Tenderness palpation at the anterior lateral ankle left.  Slight tenderness in the sinus tarsi.  Tenderness with range of motion of the ankle joint.  No tenderness with range of motion of the subtalar joint.  Diagnosis: 1.  Arthralgia ankle joint left  Plan: -Discussed with pain in the ankle.  Probably has some chronic synovitis from her initial ankle sprain many months ago.  Last injection helped a lot. -injected 3cc 2:1 mixture 0.5 cc Marcaine :Kenolog 10mg /98ml at anterior lateral ankle joint.    Return 2 weeks follow-up injection ankle joint left

## 2023-09-21 ENCOUNTER — Ambulatory Visit: Admitting: Podiatry

## 2023-09-21 ENCOUNTER — Encounter

## 2023-09-21 DIAGNOSIS — Z1231 Encounter for screening mammogram for malignant neoplasm of breast: Secondary | ICD-10-CM

## 2023-10-01 ENCOUNTER — Ambulatory Visit: Admitting: Podiatry

## 2023-10-01 ENCOUNTER — Encounter: Payer: Self-pay | Admitting: Podiatry

## 2023-10-01 DIAGNOSIS — M25572 Pain in left ankle and joints of left foot: Secondary | ICD-10-CM | POA: Diagnosis not present

## 2023-10-01 MED ORDER — TRIAMCINOLONE ACETONIDE 10 MG/ML IJ SUSP
10.0000 mg | Freq: Once | INTRAMUSCULAR | Status: AC
  Administered 2023-10-01: 10 mg

## 2023-10-01 NOTE — Progress Notes (Signed)
 Patient presents today 2 weeks follow-up injection ankle left.  Doing better but still painful.  Says probably about 40 to 50% better.   Physical exam:  General appearance: Pleasant, and in no acute distress. AOx3.  Vascular: Pedal pulses: DP 2/4 bilaterally, PT 2/4 bilaterally.  Mild to moderate edema lower legs bilaterally.   Neurological: Grossly intact bilaterally Dermatologic:   Skin normal temperature bilaterally.  Skin normal color, tone, and texture bilaterally.   Musculoskeletal: Tenderness anterior lateral ankle joint left.  Tenderness with range of motion ankle joint left.  No tenderness in sinus tarsi or with range of motion subtalar joint left.    Diagnosis: 1.  Arthralgia ankle joint left  Plan: -Continue wearing good stiff supportive shoes recommend icing 20 minutes an hour several times a day. -injected 3cc 2:1 mixture 0.5 cc Marcaine :Kenolog 10mg /31ml at anterior lateral ankle joint left.    Return 2 weeks follow-up injection ankle left

## 2023-10-13 ENCOUNTER — Ambulatory Visit: Payer: Self-pay

## 2023-10-13 NOTE — Telephone Encounter (Signed)
 FYI Only or Action Required?: FYI only for provider.  Patient was last seen in primary care on 08/09/2023 by Antonio Meth, Jamee SAUNDERS, DO.  Called Nurse Triage reporting Cystitis.  Symptoms began several days ago.  Interventions attempted: Nothing.  Symptoms are: gradually worsening.  Triage Disposition: See Physician Within 24 Hours  Patient/caregiver understands and will follow disposition?: Yes  Copied from CRM [id number]. Topic: Clinical - Red Word Triage >> Oct 13, 2023 10:02 AM Frederich PARAS wrote: [facility] that prompted transfer to Nurse Triage: possible bladder / bacterial infection  pt had bladder infection and a bacterial infection she think it is back, a smell, keep goig to the restroom, Reason for Disposition  Urinating more frequently than usual (i.e., frequency) OR new-onset of the feeling of an urgent need to urinate (i.e., urgency)  Answer Assessment - Initial Assessment Questions 1. SYMPTOM: What's the main symptom you're concerned about? (e.g., frequency, incontinence)     Frequency, strong odor 2. ONSET: When did the  symptoms  start?     Last thursday 3. PAIN: Is there any pain? If Yes, ask: How bad is it? (Scale: 1-10; mild, moderate, severe)     denies 4. CAUSE: What do you think is causing the symptoms?     uti 5. OTHER SYMPTOMS: Do you have any other symptoms? (e.g., blood in urine, fever, flank pain, pain with urination)     no 6. PREGNANCY: Is there any chance you are pregnant? When was your last menstrual period?     na  Protocols used: Urinary Symptoms-A-AH

## 2023-10-14 ENCOUNTER — Encounter: Payer: Self-pay | Admitting: Medical

## 2023-10-14 ENCOUNTER — Ambulatory Visit: Admitting: Medical

## 2023-10-14 VITALS — BP 130/80 | HR 79 | Temp 98.0°F | Resp 16 | Ht 63.0 in | Wt 252.0 lb

## 2023-10-14 DIAGNOSIS — R300 Dysuria: Secondary | ICD-10-CM | POA: Diagnosis not present

## 2023-10-14 DIAGNOSIS — R350 Frequency of micturition: Secondary | ICD-10-CM

## 2023-10-14 DIAGNOSIS — N390 Urinary tract infection, site not specified: Secondary | ICD-10-CM

## 2023-10-14 LAB — POCT URINALYSIS DIPSTICK
Bilirubin, UA: NEGATIVE
Blood, UA: NEGATIVE
Glucose, UA: NEGATIVE
Ketones, UA: NEGATIVE
Nitrite, UA: POSITIVE
Protein, UA: NEGATIVE
Spec Grav, UA: 1.01 (ref 1.010–1.025)
Urobilinogen, UA: 0.2 U/dL
pH, UA: 6 (ref 5.0–8.0)

## 2023-10-14 MED ORDER — CEPHALEXIN 500 MG PO CAPS: 500.0000 mg | ORAL_CAPSULE | Freq: Two times a day (BID) | ORAL | 0 refills | Status: AC

## 2023-10-14 NOTE — Progress Notes (Unsigned)
   Subjective:    Patient ID: Molly Klein, female    DOB: 1961/01/06, 63 y.o.   MRN: [id number]  HPI Molly Klein is a 63 year old female who presents with persistent urinary symptoms following a recent urinary tract infection.  Approximately two and a half weeks ago, she was diagnosed with a bacterial bladder infection and a pulled muscle in her back at urgent care. Despite completing the treatment, she continues to experience urinary symptoms.  Her current symptoms include frequent urination, mild burning during urination, and a sensation of pressure over her bladder. The burning sensation is less severe than before, when it felt 'like on fire'. These symptoms have persisted since last month.  She experienced back pain related to pulling herself up due to car issues, but this muscle pain has resolved.  Additionally, she was stung by a bee on her left index finger yesterday, resulting in localized swelling, but no severe allergic reaction occurred.   Review of Systems See hpi    Objective:   Physical Exam  General- No acute distress. Pleasant patient. Neck- Full range of motion, no jvd Lungs- Clear, even and unlabored. Heart- regular rate and rhythm. Neurologic- CNII- XII grossly intact.  Abdomen- faint suprapubic tender to palpation. +bs, no rebound or guarding. Back- no cva tenderness.      Assessment & Plan:   Patient Instructions  Frequent urination with dysuria. Probable uti Recurrent UTI with mild dysuria and frequent urination. Urinalysis indicates high likelihood of infection. Differential diagnosis includes STIs. - Prescribed cephalexin  500 mg twice daily for 7 days pending culture results. - Advised use  Azo-standard for urinary pain. - Encouraged hydration. - Ordered culture and sensitivity test. - Update on test results via MyChart.  Follow up date to be determined after lab results.   Maryssa Giampietro, PA-C

## 2023-10-14 NOTE — Patient Instructions (Signed)
 Frequent urination with dysuria. Probable uti Recurrent UTI with mild dysuria and frequent urination. Urinalysis indicates high likelihood of infection. Differential diagnosis includes STIs. - Prescribed cephalexin  500 mg twice daily for 7 days pending culture results. - Advised use of Pyridium or Azo-standard for bladder pain. - Encouraged hydration. - Ordered culture and sensitivity test. - Update on test results via MyChart.  Follow up date to be determined after lab results.

## 2023-10-15 ENCOUNTER — Ambulatory Visit: Admitting: Podiatry

## 2023-10-15 ENCOUNTER — Encounter: Payer: Self-pay | Admitting: Podiatry

## 2023-10-15 DIAGNOSIS — M25572 Pain in left ankle and joints of left foot: Secondary | ICD-10-CM

## 2023-10-15 MED ORDER — METHYLPREDNISOLONE 4 MG PO TBPK: ORAL_TABLET | ORAL | 0 refills | Status: AC

## 2023-10-15 NOTE — Progress Notes (Signed)
 Presents today for follow-up ankle injection left.  Doing much better.  Just a slight bit of soreness at this point.  Has not noticed any redness or swelling in the area.   Physical exam:  General appearance: Pleasant, and in no acute distress. AOx3.  Vascular: Pedal pulses: DP 2/4 bilaterally, PT 2/4 bilaterally.  Mild to moderate edema lower legs bilaterally. Capillary fill time immediate bilaterally.  Neurological: Grossly intact bilaterally  Dermatologic:   Skin normal temperature bilaterally.  Skin normal color, tone, and texture bilaterally.   Musculoskeletal: Some tenderness to palpation over the anterior lateral aspect of the ankle left.  No tenderness with range of motion of the ankle left.  Diagnosis: 1.  Arthralgia ankle joint left  Plan: -S/p visit for evaluation and management level 3. - Doing much better.  Recommended continue icing.  She can also use Voltaren  gel as needed.  Will try Medrol  Dosepak to try to get rid of the rest of the inflammation.  If it continues to bother her we can always try another injection. -Rx Medrol  Dosepak: Take as directed  Return prn

## 2023-10-16 ENCOUNTER — Ambulatory Visit: Payer: Self-pay | Admitting: Medical

## 2023-10-16 LAB — URINE CULTURE
MICRO NUMBER:: 16954983
SPECIMEN QUALITY:: ADEQUATE

## 2023-10-16 NOTE — Addendum Note (Signed)
 Addended by: DORINA DALLAS HERO on: 10/16/2023 02:14 PM   Modules accepted: Orders

## 2023-10-26 ENCOUNTER — Other Ambulatory Visit

## 2023-10-26 DIAGNOSIS — N390 Urinary tract infection, site not specified: Secondary | ICD-10-CM

## 2023-10-27 LAB — URINE CULTURE
MICRO NUMBER:: 17004817
Result:: NO GROWTH
SPECIMEN QUALITY:: ADEQUATE

## 2023-10-27 NOTE — Telephone Encounter (Signed)
 Copied from CRM #[id number]. Topic: Clinical - Lab/Test Results >> Oct 26, 2023  5:35 PM Shereese L wrote: Reason for CRM: patient would like a call back to go over her lab results

## 2023-11-16 ENCOUNTER — Other Ambulatory Visit: Payer: Self-pay | Admitting: Family Medicine

## 2023-11-16 DIAGNOSIS — E1165 Type 2 diabetes mellitus with hyperglycemia: Secondary | ICD-10-CM

## 2023-11-23 ENCOUNTER — Encounter: Payer: Self-pay | Admitting: *Deleted

## 2023-11-23 LAB — HM DIABETES EYE EXAM

## 2024-02-09 ENCOUNTER — Telehealth: Payer: Self-pay | Admitting: *Deleted

## 2024-02-09 ENCOUNTER — Other Ambulatory Visit: Payer: Self-pay | Admitting: Family Medicine

## 2024-02-09 DIAGNOSIS — J45901 Unspecified asthma with (acute) exacerbation: Secondary | ICD-10-CM

## 2024-02-09 NOTE — Telephone Encounter (Unsigned)
 Copied from CRM [id number]. Topic: Clinical - Medication Refill >> Feb 09, 2024 10:20 AM Larissa S wrote: Medication: albuterol  (PROVENTIL ) (2.5 MG/3ML) 0.083% nebulizer solution Needs new nebulizer hose and cup.   Has the patient contacted their pharmacy? Yes (Agent: If no, request that the patient contact the pharmacy for the refill. If patient does not wish to contact the pharmacy document the reason why and proceed with request.) (Agent: If yes, when and what did the pharmacy advise?)  This is the patient's preferred pharmacy:  [id number] - [place], [place] - [address] [facility] [address] [place] [address] Phone: [phone] Fax: [phone]  Is this the correct pharmacy for this prescription? Yes If no, delete pharmacy and type the correct one.   Has the prescription been filled recently? No  Is the patient out of the medication? Yes  Has the patient been seen for an appointment in the last year OR does the patient have an upcoming appointment? Yes  Can we respond through MyChart? Yes  Agent: Please be advised that Rx refills may take up to 3 business days. We ask that you follow-up with your pharmacy.

## 2024-02-09 NOTE — Telephone Encounter (Signed)
 Copied from CRM #[id number]. Topic: Clinical - Order For Equipment >> Feb 09, 2024 10:25 AM Larissa RAMAN wrote: Reason for CRM: Patient is requesting a cup and hose for her nebulizer.   Equipment Information: [employer] Compressor Model # [id number] Serial # [id number]

## 2024-02-10 MED ORDER — ALBUTEROL SULFATE (2.5 MG/3ML) 0.083% IN NEBU: INHALATION_SOLUTION | RESPIRATORY_TRACT | 0 refills | Status: AC

## 2024-02-11 NOTE — Telephone Encounter (Signed)
 Advised patient of note.  She stated that she went to a supply store and bought what she needed.
